# Patient Record
Sex: Female | Born: 1960
Health system: Southern US, Community
[De-identification: ages and names within clinical notes are randomized; demographics above are authoritative.]

## PROBLEM LIST (undated history)

## (undated) DIAGNOSIS — B019 Varicella without complication: Secondary | ICD-10-CM

## (undated) DIAGNOSIS — R600 Localized edema: Secondary | ICD-10-CM

## (undated) DIAGNOSIS — B009 Herpesviral infection, unspecified: Secondary | ICD-10-CM

## (undated) DIAGNOSIS — E039 Hypothyroidism, unspecified: Secondary | ICD-10-CM

## (undated) DIAGNOSIS — K649 Unspecified hemorrhoids: Secondary | ICD-10-CM

## (undated) DIAGNOSIS — Z9889 Other specified postprocedural states: Secondary | ICD-10-CM

## (undated) DIAGNOSIS — R112 Nausea with vomiting, unspecified: Secondary | ICD-10-CM

## (undated) DIAGNOSIS — M7541 Impingement syndrome of right shoulder: Secondary | ICD-10-CM

## (undated) HISTORY — DX: Unspecified hemorrhoids: K64.9

## (undated) HISTORY — DX: Hypothyroidism, unspecified: E03.9

## (undated) HISTORY — DX: Localized edema: R60.0

## (undated) HISTORY — DX: Varicella without complication: B01.9

## (undated) HISTORY — PX: HEMORRHOID SURGERY: SHX153

## (undated) HISTORY — DX: Herpesviral infection, unspecified: B00.9

---
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
## 2000-03-20 ENCOUNTER — Other Ambulatory Visit: Admission: RE | Admit: 2000-03-20 | Discharge: 2000-03-20 | Payer: Self-pay | Admitting: Gynecology

## 2000-04-21 ENCOUNTER — Other Ambulatory Visit: Admission: RE | Admit: 2000-04-21 | Discharge: 2000-04-21 | Payer: Self-pay | Admitting: Gynecology

## 2000-08-19 ENCOUNTER — Observation Stay (HOSPITAL_COMMUNITY): Admission: EM | Admit: 2000-08-19 | Discharge: 2000-08-20 | Payer: Self-pay | Admitting: Emergency Medicine

## 2000-08-19 ENCOUNTER — Encounter: Payer: Self-pay | Admitting: Emergency Medicine

## 2000-08-20 ENCOUNTER — Encounter: Payer: Self-pay | Admitting: Orthopedic Surgery

## 2002-11-11 HISTORY — PX: TUBAL LIGATION: SHX77

## 2012-11-11 HISTORY — PX: BREAST LUMPECTOMY: SHX2

## 2012-11-11 HISTORY — PX: BREAST EXCISIONAL BIOPSY: SUR124

## 2014-01-21 LAB — HM COLONOSCOPY

## 2015-12-08 DIAGNOSIS — E569 Vitamin deficiency, unspecified: Secondary | ICD-10-CM | POA: Diagnosis not present

## 2015-12-08 DIAGNOSIS — N926 Irregular menstruation, unspecified: Secondary | ICD-10-CM | POA: Diagnosis not present

## 2015-12-08 DIAGNOSIS — E039 Hypothyroidism, unspecified: Secondary | ICD-10-CM | POA: Diagnosis not present

## 2015-12-08 DIAGNOSIS — R600 Localized edema: Secondary | ICD-10-CM | POA: Diagnosis not present

## 2015-12-08 DIAGNOSIS — L906 Striae atrophicae: Secondary | ICD-10-CM | POA: Diagnosis not present

## 2015-12-08 DIAGNOSIS — Z0000 Encounter for general adult medical examination without abnormal findings: Secondary | ICD-10-CM | POA: Diagnosis not present

## 2015-12-19 MED FILL — valACYclovir HCL 1 GM TABS: 1 | 30 days supply | Qty: 30 | Fill #0

## 2016-02-20 DIAGNOSIS — K645 Perianal venous thrombosis: Secondary | ICD-10-CM | POA: Diagnosis not present

## 2016-02-20 MED FILL — HYDROCORTISONE 2.5% CREAM: 2.5 | 10 days supply | Qty: 30 | Fill #0

## 2016-04-02 MED FILL — HYDROCORTISONE 2.5% CREAM: 2.5 | 10 days supply | Qty: 30 | Fill #1

## 2016-05-31 DIAGNOSIS — J029 Acute pharyngitis, unspecified: Secondary | ICD-10-CM | POA: Diagnosis not present

## 2016-05-31 DIAGNOSIS — Z1159 Encounter for screening for other viral diseases: Secondary | ICD-10-CM | POA: Diagnosis not present

## 2016-05-31 DIAGNOSIS — N926 Irregular menstruation, unspecified: Secondary | ICD-10-CM | POA: Diagnosis not present

## 2016-05-31 DIAGNOSIS — Z20818 Contact with and (suspected) exposure to other bacterial communicable diseases: Secondary | ICD-10-CM | POA: Diagnosis not present

## 2016-05-31 DIAGNOSIS — R600 Localized edema: Secondary | ICD-10-CM | POA: Diagnosis not present

## 2016-05-31 DIAGNOSIS — E039 Hypothyroidism, unspecified: Secondary | ICD-10-CM | POA: Diagnosis not present

## 2016-05-31 DIAGNOSIS — L906 Striae atrophicae: Secondary | ICD-10-CM | POA: Diagnosis not present

## 2016-12-04 ENCOUNTER — Other Ambulatory Visit: Payer: Self-pay | Admitting: Pediatrics

## 2016-12-04 MED ORDER — AZITHROMYCIN 250 MG PO TABS: ORAL_TABLET | ORAL | 0 refills | Status: DC

## 2016-12-04 MED FILL — AZITHROMYCIN 250 MG TABLET: 250 | 5 days supply | Qty: 6 | Fill #0

## 2016-12-04 NOTE — Progress Notes (Signed)
Sinusitis

## 2017-01-14 ENCOUNTER — Ambulatory Visit (INDEPENDENT_AMBULATORY_CARE_PROVIDER_SITE_OTHER): Payer: 59 | Admitting: Family Medicine

## 2017-01-14 ENCOUNTER — Encounter: Payer: Self-pay | Admitting: Family Medicine

## 2017-01-14 VITALS — BP 105/74 | HR 67 | Temp 98.1°F | Resp 20 | Ht 64.5 in | Wt 164.2 lb

## 2017-01-14 DIAGNOSIS — Z0000 Encounter for general adult medical examination without abnormal findings: Secondary | ICD-10-CM | POA: Insufficient documentation

## 2017-01-14 DIAGNOSIS — E559 Vitamin D deficiency, unspecified: Secondary | ICD-10-CM | POA: Insufficient documentation

## 2017-01-14 DIAGNOSIS — Z1239 Encounter for other screening for malignant neoplasm of breast: Secondary | ICD-10-CM

## 2017-01-14 DIAGNOSIS — Z13220 Encounter for screening for lipoid disorders: Secondary | ICD-10-CM

## 2017-01-14 DIAGNOSIS — Z1231 Encounter for screening mammogram for malignant neoplasm of breast: Secondary | ICD-10-CM

## 2017-01-14 DIAGNOSIS — Z114 Encounter for screening for human immunodeficiency virus [HIV]: Secondary | ICD-10-CM | POA: Diagnosis not present

## 2017-01-14 DIAGNOSIS — E039 Hypothyroidism, unspecified: Secondary | ICD-10-CM | POA: Insufficient documentation

## 2017-01-14 DIAGNOSIS — Z130 Encounter for screening for diseases of the blood and blood-forming organs and certain disorders involving the immune mechanism: Secondary | ICD-10-CM

## 2017-01-14 DIAGNOSIS — Z131 Encounter for screening for diabetes mellitus: Secondary | ICD-10-CM

## 2017-01-14 DIAGNOSIS — Z23 Encounter for immunization: Secondary | ICD-10-CM | POA: Diagnosis not present

## 2017-01-14 LAB — CBC WITH DIFFERENTIAL/PLATELET
Basophils Absolute: 0.1 10*3/uL (ref 0.0–0.1)
Basophils Relative: 1.1 % (ref 0.0–3.0)
Eosinophils Absolute: 0.2 10*3/uL (ref 0.0–0.7)
Eosinophils Relative: 2.9 % (ref 0.0–5.0)
HCT: 38.8 % (ref 36.0–46.0)
Hemoglobin: 12.5 g/dL (ref 12.0–15.0)
Lymphocytes Relative: 25.5 % (ref 12.0–46.0)
Lymphs Abs: 1.5 10*3/uL (ref 0.7–4.0)
MCHC: 32.2 g/dL (ref 30.0–36.0)
MCV: 88.4 fl (ref 78.0–100.0)
Monocytes Absolute: 0.5 10*3/uL (ref 0.1–1.0)
Monocytes Relative: 8.1 % (ref 3.0–12.0)
Neutro Abs: 3.7 10*3/uL (ref 1.4–7.7)
Neutrophils Relative %: 62.4 % (ref 43.0–77.0)
Platelets: 352 10*3/uL (ref 150.0–400.0)
RBC: 4.39 Mil/uL (ref 3.87–5.11)
RDW: 15.3 % (ref 11.5–15.5)
WBC: 5.9 10*3/uL (ref 4.0–10.5)

## 2017-01-14 LAB — COMPREHENSIVE METABOLIC PANEL
ALT: 9 U/L (ref 0–35)
AST: 16 U/L (ref 0–37)
Albumin: 4 g/dL (ref 3.5–5.2)
Alkaline Phosphatase: 58 U/L (ref 39–117)
BUN: 13 mg/dL (ref 6–23)
CO2: 32 mEq/L (ref 19–32)
Calcium: 9.3 mg/dL (ref 8.4–10.5)
Chloride: 104 mEq/L (ref 96–112)
Creatinine, Ser: 0.64 mg/dL (ref 0.40–1.20)
GFR: 102.79 mL/min (ref >60.00)
Glucose, Bld: 89 mg/dL (ref 70–99)
Potassium: 4.8 mEq/L (ref 3.5–5.1)
Sodium: 140 mEq/L (ref 135–145)
Total Bilirubin: 0.3 mg/dL (ref 0.2–1.2)
Total Protein: 6.9 g/dL (ref 6.0–8.3)

## 2017-01-14 LAB — LIPID PANEL
Cholesterol: 230 mg/dL — ABNORMAL HIGH (ref 0–200)
HDL: 74.4 mg/dL (ref >39.00)
LDL Cholesterol: 134 mg/dL — ABNORMAL HIGH (ref 0–99)
NonHDL: 155.19
Total CHOL/HDL Ratio: 3
Triglycerides: 107 mg/dL (ref 0.0–149.0)
VLDL: 21.4 mg/dL (ref 0.0–40.0)

## 2017-01-14 LAB — TSH: TSH: 0.7 u[IU]/mL (ref 0.35–4.50)

## 2017-01-14 LAB — VITAMIN D 25 HYDROXY (VIT D DEFICIENCY, FRACTURES): VITD: 11.54 ng/mL — AB (ref 30.00–100.00)

## 2017-01-14 NOTE — Progress Notes (Signed)
Patient ID: Molly Park, female  DOB: [birth year], 56 y.o.   MRN: [id number] Patient Care Team    Relationship Specialty Notifications Start End  Ma Hillock, DO PCP - General Family Medicine  01/14/17     Subjective:  Molly Park is a 56 y.o.  female present for new patient establishment. All past medical history, surgical history, allergies, family history, immunizations, medications and social history were obtained in the electronic medical record today. All recent labs, ED visits and hospitalizations within the last year were reviewed.  Health maintenance:  Colonoscopy: no fhx, screen completed 01/21/2014,  at ? Provider in [place].  Mammogram: completed:2015, birads 2. Order placed today for mammogram at [facility]. Cervical cancer screening: last pap: 2016, results: ?, completed by: [place] Immunizations: tdap ~10 years updated today. Influenza UTD 2017(encouraged yearly) Infectious disease screening: HIV completed today, Hep C completed 05/2016 DEXA: n/a Assistive device:None Oxygen VXB:LTJQ Patient has a Dental home. Hospitalizations/ED visits: None   Depression screen [facility] 2/9 01/14/2017  Decreased Interest 0  Down, Depressed, Hopeless 0  PHQ - 2 Score 0   Current Exercise Habits: Structured exercise class, Type of exercise: strength training/weights, Time (Minutes): 60, Frequency (Times/Week): 3, Weekly Exercise (Minutes/Week): 180, Intensity: Moderate Exercise limited by: None identified   Immunization History  Administered Date(s) Administered  . Hepatitis A, Ped/Adol-2 Dose 12/07/2012  . Influenza, Seasonal, Injecte, Preservative Fre 09/15/2014  . Influenza-Unspecified 08/11/2016  . Tdap 01/14/2017     Past Medical History:  Diagnosis Date  . Bilateral edema of lower extremity    tried on low dose lasix by prior provider  . Chicken pox   . Hemorrhoid   . HSV infection   . Hypothyroidism    No Known Allergies Past Surgical History:    Procedure Laterality Date  . APPENDECTOMY  1973  . BREAST LUMPECTOMY Right 2014  . HEMORRHOID SURGERY    . TUBAL LIGATION  2004   Family History  Problem Relation Age of Onset  . Diabetes Mother   . Valvular heart disease Father   . Breast cancer Maternal Aunt     50   Social History   Social History  . Marital status: Married    Spouse name: Barbaraann Rondo  . Number of children: 5  . Years of education: BA   Occupational History  . Practice admin    Social History Main Topics  . Smoking status: Never Smoker  . Smokeless tobacco: Never Used  . Alcohol use No  . Drug use: No  . Sexual activity: Yes    Partners: Male    Birth control/ protection: Surgical     Comment: Married   Other Topics Concern  . Not on file   Social History Narrative   Patient is married to Leaf. They have 5 children btw them.   BA degree. 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   Drinks caffeine.   Wears a seatbelt, wears a bicycle helmet, smoke detector in the home.   Firearms in the home.   Exercises routinely.   Feels safe in her relationships.   Allergies as of 01/14/2017   No Known Allergies     Medication List       Accurate as of 01/14/17  6:39 PM. Always use your most recent med list.          Boric Acid Powd Apply one suppository vaginally at bedtime before menses or after   SYNTHROID 100 MCG  tablet Generic drug:  levothyroxine Take 100 mcg by mouth. Brand Name only   valACYclovir 1000 MG tablet Commonly known as:  VALTREX Take by mouth.      Recent Results (from the past 2160 hour(s))  CBC w/Diff     Status: None   Collection Time: 01/14/17  9:58 AM  Result Value Ref Range   WBC 5.9 4.0 - 10.5 K/uL   RBC 4.39 3.87 - 5.11 Mil/uL   Hemoglobin 12.5 12.0 - 15.0 g/dL   HCT 38.8 36.0 - 46.0 %   MCV 88.4 78.0 - 100.0 fl   MCHC 32.2 30.0 - 36.0 g/dL   RDW 15.3 11.5 - 15.5 %   Platelets 352.0 150.0 - 400.0 K/uL   Neutrophils Relative % 62.4  43.0 - 77.0 %   Lymphocytes Relative 25.5 12.0 - 46.0 %   Monocytes Relative 8.1 3.0 - 12.0 %   Eosinophils Relative 2.9 0.0 - 5.0 %   Basophils Relative 1.1 0.0 - 3.0 %   Neutro Abs 3.7 1.4 - 7.7 K/uL   Lymphs Abs 1.5 0.7 - 4.0 K/uL   Monocytes Absolute 0.5 0.1 - 1.0 K/uL   Eosinophils Absolute 0.2 0.0 - 0.7 K/uL   Basophils Absolute 0.1 0.0 - 0.1 K/uL  Comp Met (CMET)     Status: None   Collection Time: 01/14/17  9:58 AM  Result Value Ref Range   Sodium 140 135 - 145 mEq/L   Potassium 4.8 3.5 - 5.1 mEq/L   Chloride 104 96 - 112 mEq/L   CO2 32 19 - 32 mEq/L   Glucose, Bld 89 70 - 99 mg/dL   BUN 13 6 - 23 mg/dL   Creatinine, Ser 0.64 0.40 - 1.20 mg/dL   Total Bilirubin 0.3 0.2 - 1.2 mg/dL   Alkaline Phosphatase 58 39 - 117 U/L   AST 16 0 - 37 U/L   ALT 9 0 - 35 U/L   Total Protein 6.9 6.0 - 8.3 g/dL   Albumin 4.0 3.5 - 5.2 g/dL   Calcium 9.3 8.4 - 10.5 mg/dL   GFR 102.79 >60.00 mL/min  Lipid panel     Status: Abnormal   Collection Time: 01/14/17  9:58 AM  Result Value Ref Range   Cholesterol 230 (H) 0 - 200 mg/dL    Comment: ATP III Classification       Desirable:  < 200 mg/dL               Borderline High:  200 - 239 mg/dL          High:  > = 240 mg/dL   Triglycerides 107.0 0.0 - 149.0 mg/dL    Comment: Normal:  <150 mg/dLBorderline High:  150 - 199 mg/dL   HDL 74.40 >39.00 mg/dL   VLDL 21.4 0.0 - 40.0 mg/dL   LDL Cholesterol 134 (H) 0 - 99 mg/dL   Total CHOL/HDL Ratio 3     Comment:                Men          Women1/2 Average Risk     3.4          3.3Average Risk          5.0          4.42X Average Risk          9.6          7.13X Average Risk  15.0          11.0                       NonHDL 155.19     Comment: NOTE:  Non-HDL goal should be 30 mg/dL higher than patient's LDL goal (i.e. LDL goal of < 70 mg/dL, would have non-HDL goal of < 100 mg/dL)  TSH     Status: None   Collection Time: 01/14/17  9:58 AM  Result Value Ref Range   TSH 0.70 0.35 - 4.50 uIU/mL    Vitamin D (25 hydroxy)     Status: Abnormal   Collection Time: 01/14/17  9:58 AM  Result Value Ref Range   VITD 11.54 (L) 30.00 - 100.00 ng/mL    ROS: 14 pt review of systems performed and negative (unless mentioned in an HPI)  Objective: BP 105/74 (BP Location: Left Arm, Patient Position: Sitting, Cuff Size: Normal)   Pulse 67   Temp 98.1 F (36.7 C)   Resp 20   Ht 5' 4.5" (1.638 m)   Wt 164 lb 4 oz (74.5 kg)   SpO2 98%   BMI 27.76 kg/m  Gen: Afebrile. No acute distress. Nontoxic in appearance, well-developed, well-nourished,  Pleasant, female. HENT: AT. [place]. Bilateral TM visualized and normal in appearance, normal external auditory canal. MMM, no oral lesions, adequate dentition. Bilateral nares within normal limits. Throat without erythema, ulcerations or exudates. No Cough on exam, no hoarseness on exam. Eyes:Pupils Equal Round Reactive to light, Extraocular movements intact,  Conjunctiva without redness, discharge or icterus. Neck/lymp/endocrine: Supple, no lymphadenopathy, no thyromegaly CV: RRR no murmur, no edema, +2/4 P posterior tibialis pulses. No carotid bruits. No JVD. Chest: CTAB, no wheeze, rhonchi or crackles. Normal Respiratory effort. Good Air movement. Abd: Soft. Flat. NTND. BS present. No Masses palpated. No hepatosplenomegaly. No rebound tenderness or guarding. Skin: No rashes, purpura or petechiae. Warm and well-perfused. Skin intact. Neuro/Msk:  Normal gait. PERLA. EOMi. Alert. Oriented x3.  Cranial nerves II through XII intact. Muscle strength 5/5 upper/lower extremity. DTRs equal bilaterally. Psych: Normal affect, dress and demeanor. Normal speech. Normal thought content and judgment.   Assessment/plan: Molly Park is a 56 y.o. female present for establishment of care with CPE.  Encounter for preventive health examination Patient was encouraged to exercise greater than 150 minutes a week. Patient was encouraged to choose a diet filled with fresh fruits  and vegetables, and lean meats. AVS provided to patient today for education/recommendation on gender specific health and safety maintenance. Colonoscopy: no fhx, screen completed 01/21/2014,  at ? Provider in [place]. Requested records.  Mammogram: completed:2015, birads 2. Order placed today for mammogram at [facility]. Cervical cancer screening: last pap: 2016, results: ?, completed by: [place] Immunizations: tdap updated today. Influenza UTD 2017(encouraged yearly) Infectious disease screening: HIV completed today, Hep C completed 05/2016 Hypothyroidism, unspecified type - TSH - pts last tsh was abnormal 6 months ago, pt states she fluctuates. No med dose changed at that time.  Screening cholesterol level - Lipid panel - TSH Diabetes mellitus screening - Hemoglobin A1c Encounter for screening for HIV - HIV antibody Vitamin D deficiency - Vitamin D (25 hydroxy) Screening for deficiency anemia - CBC w/Diff Breast cancer screening - MM Digital Screening; Future Need for Tdap vaccination - Tdap vaccine greater than or equal to 7yo IM   Return in about 1 year (around 01/14/2018) for CPE.  Electronically signed by: Howard Pouch, DO [place]

## 2017-01-14 NOTE — Patient Instructions (Signed)
Start 1000 units of vitamin D a day with a meal.  I will call you with labs once available.   Follow yearly with physicals. It was a pleasure to meet you.    Health Maintenance, Female Adopting a healthy lifestyle and getting preventive care can go a long way to promote health and wellness. Talk with your health care provider about what schedule of regular examinations is right for you. This is a good chance for you to check in with your provider about disease prevention and staying healthy. In between checkups, there are plenty of things you can do on your own. Experts have done a lot of research about which lifestyle changes and preventive measures are most likely to keep you healthy. Ask your health care provider for more information. Weight and diet Eat a healthy diet  Be sure to include plenty of vegetables, fruits, low-fat dairy products, and lean protein.  Do not eat a lot of foods high in solid fats, added sugars, or salt.  Get regular exercise. This is one of the most important things you can do for your health.  Most adults should exercise for at least 150 minutes each week. The exercise should increase your heart rate and make you sweat (moderate-intensity exercise).  Most adults should also do strengthening exercises at least twice a week. This is in addition to the moderate-intensity exercise. Maintain a healthy weight  Body mass index (BMI) is a measurement that can be used to identify possible weight problems. It estimates body fat based on height and weight. Your health care provider can help determine your BMI and help you achieve or maintain a healthy weight.  For females 51 years of age and older:  A BMI below 18.5 is considered underweight.  A BMI of 18.5 to 24.9 is normal.  A BMI of 25 to 29.9 is considered overweight.  A BMI of 30 and above is considered obese. Watch levels of cholesterol and blood lipids  You should start having your blood tested for lipids  and cholesterol at 54 years of age, then have this test every 5 years.  You may need to have your cholesterol levels checked more often if:  Your lipid or cholesterol levels are high.  You are older than 54 years of age.  You are at high risk for heart disease. Cancer screening Lung Cancer  Lung cancer screening is recommended for adults 107-43 years old who are at high risk for lung cancer because of a history of smoking.  A yearly low-dose CT scan of the lungs is recommended for people who:  Currently smoke.  Have quit within the past 15 years.  Have at least a 30-pack-year history of smoking. A pack year is smoking an average of one pack of cigarettes a day for 1 year.  Yearly screening should continue until it has been 15 years since you quit.  Yearly screening should stop if you develop a health problem that would prevent you from having lung cancer treatment. Breast Cancer  Practice breast self-awareness. This means understanding how your breasts normally appear and feel.  It also means doing regular breast self-exams. Let your health care provider know about any changes, no matter how small.  If you are in your 20s or 30s, you should have a clinical breast exam (CBE) by a health care provider every 1-3 years as part of a regular health exam.  If you are 34 or older, have a CBE every year. Also consider having  a breast X-ray (mammogram) every year.  If you have a family history of breast cancer, talk to your health care provider about genetic screening.  If you are at high risk for breast cancer, talk to your health care provider about having an MRI and a mammogram every year.  Breast cancer gene (BRCA) assessment is recommended for women who have family members with BRCA-related cancers. BRCA-related cancers include:  Breast.  Ovarian.  Tubal.  Peritoneal cancers.  Results of the assessment will determine the need for genetic counseling and BRCA1 and BRCA2  testing. Cervical Cancer  Your health care provider may recommend that you be screened regularly for cancer of the pelvic organs (ovaries, uterus, and vagina). This screening involves a pelvic examination, including checking for microscopic changes to the surface of your cervix (Pap test). You may be encouraged to have this screening done every 3 years, beginning at age 51.  For women ages 17-65, health care providers may recommend pelvic exams and Pap testing every 3 years, or they may recommend the Pap and pelvic exam, combined with testing for human papilloma virus (HPV), every 5 years. Some types of HPV increase your risk of cervical cancer. Testing for HPV may also be done on women of any age with unclear Pap test results.  Other health care providers may not recommend any screening for nonpregnant women who are considered low risk for pelvic cancer and who do not have symptoms. Ask your health care provider if a screening pelvic exam is right for you.  If you have had past treatment for cervical cancer or a condition that could lead to cancer, you need Pap tests and screening for cancer for at least 20 years after your treatment. If Pap tests have been discontinued, your risk factors (such as having a new sexual partner) need to be reassessed to determine if screening should resume. Some women have medical problems that increase the chance of getting cervical cancer. In these cases, your health care provider may recommend more frequent screening and Pap tests. Colorectal Cancer  This type of cancer can be detected and often prevented.  Routine colorectal cancer screening usually begins at 54 years of age and continues through 54 years of age.  Your health care provider may recommend screening at an earlier age if you have risk factors for colon cancer.  Your health care provider may also recommend using home test kits to check for hidden blood in the stool.  A small camera at the end of a  tube can be used to examine your colon directly (sigmoidoscopy or colonoscopy). This is done to check for the earliest forms of colorectal cancer.  Routine screening usually begins at age 70.  Direct examination of the colon should be repeated every 5-10 years through 54 years of age. However, you may need to be screened more often if early forms of precancerous polyps or small growths are found. Skin Cancer  Check your skin from head to toe regularly.  Tell your health care provider about any new moles or changes in moles, especially if there is a change in a mole's shape or color.  Also tell your health care provider if you have a mole that is larger than the size of a pencil eraser.  Always use sunscreen. Apply sunscreen liberally and repeatedly throughout the day.  Protect yourself by wearing long sleeves, pants, a wide-brimmed hat, and sunglasses whenever you are outside. Heart disease, diabetes, and high blood pressure  High blood pressure  causes heart disease and increases the risk of stroke. High blood pressure is more likely to develop in:  People who have blood pressure in the high end of the normal range (130-139/85-89 mm Hg).  People who are overweight or obese.  People who are African American.  If you are 62-32 years of age, have your blood pressure checked every 3-5 years. If you are 25 years of age or older, have your blood pressure checked every year. You should have your blood pressure measured twice-once when you are at a hospital or clinic, and once when you are not at a hospital or clinic. Record the average of the two measurements. To check your blood pressure when you are not at a hospital or clinic, you can use:  An automated blood pressure machine at a pharmacy.  A home blood pressure monitor.  If you are between 71 years and 50 years old, ask your health care provider if you should take aspirin to prevent strokes.  Have regular diabetes screenings. This  involves taking a blood sample to check your fasting blood sugar level.  If you are at a normal weight and have a low risk for diabetes, have this test once every three years after 54 years of age.  If you are overweight and have a high risk for diabetes, consider being tested at a younger age or more often. Preventing infection Hepatitis B  If you have a higher risk for hepatitis B, you should be screened for this virus. You are considered at high risk for hepatitis B if:  You were born in a country where hepatitis B is common. Ask your health care provider which countries are considered high risk.  Your parents were born in a high-risk country, and you have not been immunized against hepatitis B (hepatitis B vaccine).  You have HIV or AIDS.  You use needles to inject street drugs.  You live with someone who has hepatitis B.  You have had sex with someone who has hepatitis B.  You get hemodialysis treatment.  You take certain medicines for conditions, including cancer, organ transplantation, and autoimmune conditions. Hepatitis C  Blood testing is recommended for:  Everyone born from [birth year] through 1963.  Anyone with known risk factors for hepatitis C. Sexually transmitted infections (STIs)  You should be screened for sexually transmitted infections (STIs) including gonorrhea and chlamydia if:  You are sexually active and are younger than 54 years of age.  You are older than 54 years of age and your health care provider tells you that you are at risk for this type of infection.  Your sexual activity has changed since you were last screened and you are at an increased risk for chlamydia or gonorrhea. Ask your health care provider if you are at risk.  If you do not have HIV, but are at risk, it may be recommended that you take a prescription medicine daily to prevent HIV infection. This is called pre-exposure prophylaxis (PrEP). You are considered at risk if:  You are  sexually active and do not regularly use condoms or know the HIV status of your partner(s).  You take drugs by injection.  You are sexually active with a partner who has HIV. Talk with your health care provider about whether you are at high risk of being infected with HIV. If you choose to begin PrEP, you should first be tested for HIV. You should then be tested every 3 months for as long as you are  taking PrEP. Pregnancy  If you are premenopausal and you may become pregnant, ask your health care provider about preconception counseling.  If you may become pregnant, take 400 to 800 micrograms (mcg) of folic acid every day.  If you want to prevent pregnancy, talk to your health care provider about birth control (contraception). Osteoporosis and menopause  Osteoporosis is a disease in which the bones lose minerals and strength with aging. This can result in serious bone fractures. Your risk for osteoporosis can be identified using a bone density scan.  If you are 38 years of age or older, or if you are at risk for osteoporosis and fractures, ask your health care provider if you should be screened.  Ask your health care provider whether you should take a calcium or vitamin D supplement to lower your risk for osteoporosis.  Menopause may have certain physical symptoms and risks.  Hormone replacement therapy may reduce some of these symptoms and risks. Talk to your health care provider about whether hormone replacement therapy is right for you. Follow these instructions at home:  Schedule regular health, dental, and eye exams.  Stay current with your immunizations.  Do not use any tobacco products including cigarettes, chewing tobacco, or electronic cigarettes.  If you are pregnant, do not drink alcohol.  If you are breastfeeding, limit how much and how often you drink alcohol.  Limit alcohol intake to no more than 1 drink per day for nonpregnant women. One drink equals 12 ounces of  beer, 5 ounces of wine, or 1 ounces of hard liquor.  Do not use street drugs.  Do not share needles.  Ask your health care provider for help if you need support or information about quitting drugs.  Tell your health care provider if you often feel depressed.  Tell your health care provider if you have ever been abused or do not feel safe at home. This information is not intended to replace advice given to you by your health care provider. Make sure you discuss any questions you have with your health care provider. Document Released: 05/13/2011 Document Revised: 04/04/2016 Document Reviewed: 08/01/2015 Elsevier Interactive Patient Education  2017 [employer].

## 2017-01-15 ENCOUNTER — Telehealth: Payer: Self-pay | Admitting: Family Medicine

## 2017-01-15 LAB — HIV ANTIBODY (ROUTINE TESTING W REFLEX): HIV: NONREACTIVE

## 2017-01-15 LAB — HEMOGLOBIN A1C
Hgb A1c MFr Bld: 5.3 % (ref <5.7)
MEAN PLASMA GLUCOSE: 105 mg/dL

## 2017-01-15 MED ORDER — VITAMIN D (ERGOCALCIFEROL) 1.25 MG (50000 UNIT) PO CAPS: 50000.0000 [IU] | ORAL_CAPSULE | ORAL | 0 refills | Status: DC

## 2017-01-15 MED ORDER — LEVOTHYROXINE SODIUM 100 MCG PO TABS: 100.0000 ug | ORAL_TABLET | Freq: Every day | ORAL | 3 refills | Status: DC

## 2017-01-15 MED FILL — VIT D2 1.25 MG (50,000 UNIT: 1.25 MG | 84 days supply | Qty: 12 | Fill #0

## 2017-01-15 NOTE — Telephone Encounter (Signed)
Please call pt: - all labs l;ook great except she has very low vitd. I recommend she take 1000u OTC daily with a meal, and I have prescribed her a once weekly supplement for 12 weeks. Would retest in 12 weeks with lab appt. - her thyroid was normal, refilled her med.

## 2017-01-15 NOTE — Telephone Encounter (Signed)
Patient notified and verbalized understanding. 

## 2017-02-14 ENCOUNTER — Other Ambulatory Visit (INDEPENDENT_AMBULATORY_CARE_PROVIDER_SITE_OTHER): Payer: Self-pay | Admitting: Pediatrics

## 2017-02-14 DIAGNOSIS — M272 Inflammatory conditions of jaws: Secondary | ICD-10-CM

## 2017-02-14 MED ORDER — AMOXICILLIN-POT CLAVULANATE 875-125 MG PO TABS: 1.0000 | ORAL_TABLET | Freq: Two times a day (BID) | ORAL | 0 refills | Status: AC

## 2017-02-14 MED FILL — AMOX-CLAV 875-125 MG TABLET: 875-125 | 14 days supply | Qty: 28 | Fill #0

## 2017-02-14 NOTE — Progress Notes (Signed)
Phone call: Lower molar odontogenic infection with swelling of gums and unilateral facial pain. Dentist can see next Thursday. Recommended: Augmentin  PO BID in the meantime.

## 2017-03-05 ENCOUNTER — Ambulatory Visit
Admission: RE | Admit: 2017-03-05 | Discharge: 2017-03-05 | Disposition: A | Discharge status: Home / Self Care | Payer: 59 | Source: Ambulatory Visit | Attending: Family Medicine | Admitting: Family Medicine

## 2017-03-05 DIAGNOSIS — Z1239 Encounter for other screening for malignant neoplasm of breast: Secondary | ICD-10-CM

## 2017-03-05 DIAGNOSIS — Z1231 Encounter for screening mammogram for malignant neoplasm of breast: Secondary | ICD-10-CM | POA: Diagnosis not present

## 2017-04-15 ENCOUNTER — Telehealth: Payer: Self-pay | Admitting: Family Medicine

## 2017-04-15 NOTE — Telephone Encounter (Signed)
Patient called stating that her RX has to be Synthroid, not Levothyroxine because levothyroxine causes pt's hair to fall out.   Please contact pharmacy.

## 2017-04-16 ENCOUNTER — Other Ambulatory Visit: Payer: Self-pay | Admitting: *Deleted

## 2017-04-16 MED FILL — SYNTHROID 100 MCG TABLET: 100 | 90 days supply | Qty: 90 | Fill #0

## 2017-04-16 NOTE — Telephone Encounter (Signed)
Called pharmacy they will Rx brand name Synthroid for patient.

## 2017-04-16 NOTE — Telephone Encounter (Signed)
Called pharmacy clarified  order

## 2017-07-28 MED FILL — SYNTHROID 100 MCG TABLET: 100 | 90 days supply | Qty: 90 | Fill #1

## 2017-11-12 MED FILL — SYNTHROID 100 MCG TABLET: 100 | 90 days supply | Qty: 90 | Fill #2

## 2017-12-09 ENCOUNTER — Ambulatory Visit (INDEPENDENT_AMBULATORY_CARE_PROVIDER_SITE_OTHER): Payer: No Typology Code available for payment source | Admitting: Family Medicine

## 2017-12-09 ENCOUNTER — Encounter: Payer: Self-pay | Admitting: Family Medicine

## 2017-12-09 ENCOUNTER — Ambulatory Visit: Payer: Self-pay

## 2017-12-09 VITALS — BP 120/81 | HR 69 | Temp 98.0°F | Resp 20 | Wt 174.5 lb

## 2017-12-09 DIAGNOSIS — R229 Localized swelling, mass and lump, unspecified: Principal | ICD-10-CM

## 2017-12-09 DIAGNOSIS — IMO0002 Reserved for concepts with insufficient information to code with codable children: Secondary | ICD-10-CM | POA: Insufficient documentation

## 2017-12-09 NOTE — Patient Instructions (Signed)
[facility]. They will call to schedule.   Try aleve for discomfort.   Ultrasound results will guide us on further plan.   Lipoma A lipoma is a noncancerous (benign) tumor that is made up of fat cells. This is a very common type of soft-tissue growth. Lipomas are usually found under the skin (subcutaneous). They may occur in any tissue of the body that contains fat. Common areas for lipomas to appear include the back, shoulders, buttocks, and thighs. Lipomas grow slowly, and they are usually painless. Most lipomas do not cause problems and do not require treatment. What are the causes? The cause of this condition is not known. What increases the risk? This condition is more likely to develop in:  People who are 6940-55 years old.  People who have a family history of lipomas.  What are the signs or symptoms? A lipoma usually appears as a small, round bump under the skin. It may feel soft or rubbery, but the firmness can vary. Most lipomas are not painful. However, a lipoma may become painful if it is located in an area where it pushes on nerves. How is this diagnosed? A lipoma can usually be diagnosed with a physical exam. You may also have tests to confirm the diagnosis and to rule out other conditions. Tests may include:  Imaging tests, such as a CT scan or MRI.  Removal of a tissue sample to be looked at under a microscope (biopsy).  How is this treated? Treatment is not needed for small lipomas that are not causing problems. If a lipoma continues to get bigger or it causes problems, removal is often the best option. Lipomas can also be removed to improve appearance. Removal of a lipoma is usually done with a surgery in which the fatty cells and the surrounding capsule are removed. Most often, a medicine that numbs the area (local anesthetic) is used for this procedure. Follow these instructions at home:  Keep all follow-up visits as  directed by your health care provider. This is important. Contact a health care provider if:  Your lipoma becomes larger or hard.  Your lipoma becomes painful, red, or increasingly swollen. These could be signs of infection or a more serious condition. This information is not intended to replace advice given to you by your health care provider. Make sure you discuss any questions you have with your health care provider. Document Released: 10/18/2002 Document Revised: 04/04/2016 Document Reviewed: 10/24/2014 Elsevier Interactive Patient Education  [employer]2018 Elsevier Inc.

## 2017-12-09 NOTE — Progress Notes (Signed)
Molly Park , 04/29/1961, 57 y.o., female MRN: [id number] Patient Care Team    Relationship Specialty Notifications Start End  Molly Park, Molly [place] Park, Molly Park PCP - General Family Medicine  01/14/17     Chief Complaint  Patient presents with  . Back Pain    right lower back area she says its Park cyst     Subjective: Pt presents for an OV with complaints of right lower back discomfort that radiates to her right leg occasionally for the last 6 months. Patient reports she has Park cyst in the location of her discomfort. The cyst has become bigger and caused more discomfort over the last 6 months. She states standing for long periods of time and exercise increases her discomfort in her right lower back in the location of the cyst. She also endorses tingling sensation, mostly after exercising, down her right leg. Pt has tried NSAIDs to ease their symptoms.   [facility] 12/09/2017 01/14/2017  Decreased Interest 0 0  Down, Depressed, Hopeless 0 0  PHQ - 2 Score 0 0    No Known Allergies Social History   Tobacco Use  . Smoking status: Never Smoker  . Smokeless tobacco: Never Used  Substance Use Topics  . Alcohol use: No   Past Medical History:  Diagnosis Date  . Bilateral edema of lower extremity    tried on low dose lasix by prior provider  . Chicken pox   . Hemorrhoid   . HSV infection   . Hypothyroidism    Past Surgical History:  Procedure Laterality Date  . APPENDECTOMY  1973  . BREAST LUMPECTOMY Right 2014  . HEMORRHOID SURGERY    . TUBAL LIGATION  2004   Family History  Problem Relation Age of Onset  . Diabetes Mother   . Valvular heart disease Father   . Breast cancer Maternal Aunt        50   Allergies as of 12/09/2017   No Known Allergies     Medication List        Accurate as of 12/09/17  8:37 AM. Always use your most recent med list.          Boric Acid Powd Apply one suppository vaginally at bedtime before menses or after   SYNTHROID 100 MCG  tablet Generic drug:  levothyroxine Take 100 mcg by mouth. Brand Name only       All past medical history, surgical history, allergies, family history, immunizations andmedications were updated in the EMR today and reviewed under the history and medication portions of their EMR.     ROS: Negative, with the exception of above mentioned in HPI   Objective:  BP 120/81 (BP Location: Left Arm, Patient Position: Sitting, Cuff Size: Normal)   Pulse 69   Temp 98 F (36.7 C)   Resp 20   Wt 174 lb 8 oz (79.2 kg)   LMP 11/22/2017   SpO2 98%   BMI 29.49 kg/m  Body mass index is 29.49 kg/m. Gen: Afebrile. No acute distress. Nontoxic in appearance, well developed, well nourished.  MSK: no erythema, palpable ~ 1. 5-2 inch oval-shaped minimally mobile, tender soft tissue mass right lower lumbar, just above the right SI. Pt has Park tattoo near this area and mass location is between leaf and rose area on right side.  Skin: no erythema, bruising, rashes, purpura or petechiae. No comedone.  Neuro: Normal gait. PERLA. EOMi. Alert. Oriented x3   No exam data present No results  found. No results found for this or any previous visit (from the past 24 hour(s)).  Assessment/Plan: Tanae Petrosky is Park 57 y.o. female present for OV for  Soft tissue mass; - soft tissue mass right lower lumbar/superior SI joint. Patient reports masses growing in size over last 6 months with worsening discomfort. Will obtain muscle skeletal ultrasound of this area at [facility] imaging. Order will likely need to be changed to accommodate imaging centers need. - discuss potential differential diagnosis with patient which includes ganglion cyst, neuroma and lipoma. It is causing her symptoms with exercise and standing. - exam is not consistent with sebaceous cyst. - NSAIDs for discomfort. - follow-up dependent upon ultrasound results. Consider surgical referral, patient desires to have removed if possible.  Reviewed  expectations re: course of current medical issues.  Discussed self-management of symptoms.  Outlined signs and symptoms indicating need for more acute intervention.  Patient verbalized understanding and all questions were answered.  Patient received an After-Visit Summary.    No orders of the defined types were placed in this encounter.  Note is dictated utilizing voice recognition software. Although note has been proof read prior to signing, occasional typographical errors still can be missed. If any questions arise, please Molly Park not hesitate to call for verification.   electronically signed by:  Molly Pacini, Molly Park  [facility] - [place]

## 2017-12-12 ENCOUNTER — Telehealth: Payer: Self-pay | Admitting: *Deleted

## 2017-12-12 DIAGNOSIS — IMO0002 Reserved for concepts with insufficient information to code with codable children: Secondary | ICD-10-CM

## 2017-12-12 DIAGNOSIS — R229 Localized swelling, mass and lump, unspecified: Principal | ICD-10-CM

## 2017-12-12 NOTE — Telephone Encounter (Signed)
Called [place] [facility] spoke to Elease Hashimotoatricia Dr Claiborne BillingsKuneff wants Soft tissue US of an area on patient's lower right lumbar area just above buttock . Patient has a mass there . Elease Hashimotoatricia said order should be placed as a [id number] pelvis limited. Complete instructions were placed on the referral order. Placed order as directed.

## 2017-12-17 ENCOUNTER — Ambulatory Visit
Admission: RE | Admit: 2017-12-17 | Discharge: 2017-12-17 | Disposition: A | Discharge status: Home / Self Care | Payer: No Typology Code available for payment source | Source: Ambulatory Visit | Attending: Family Medicine | Admitting: Family Medicine

## 2017-12-17 DIAGNOSIS — IMO0002 Reserved for concepts with insufficient information to code with codable children: Secondary | ICD-10-CM

## 2017-12-17 DIAGNOSIS — R229 Localized swelling, mass and lump, unspecified: Principal | ICD-10-CM

## 2017-12-18 ENCOUNTER — Telehealth: Payer: Self-pay | Admitting: Family Medicine

## 2017-12-18 ENCOUNTER — Encounter: Payer: Self-pay | Admitting: *Deleted

## 2017-12-18 DIAGNOSIS — R229 Localized swelling, mass and lump, unspecified: Principal | ICD-10-CM

## 2017-12-18 DIAGNOSIS — M899 Disorder of bone, unspecified: Secondary | ICD-10-CM

## 2017-12-18 DIAGNOSIS — IMO0002 Reserved for concepts with insufficient information to code with codable children: Secondary | ICD-10-CM

## 2017-12-18 NOTE — Telephone Encounter (Signed)
Sent the information I discussed with patient on the phone in [facility] message as requested by patient.

## 2017-12-18 NOTE — Telephone Encounter (Signed)
Patient is requesting a MyChart message. She is asking what is the indication is for the MRI. She is still experiencing the pain so she has agreed to have the MRI 12/24/17.

## 2017-12-18 NOTE — Telephone Encounter (Signed)
Please call pt: - her US did not give cause to her discomfort. Although there is a small mass there, it may not be the actual cause of her discomfort or is a lipoma (fatty non-cancerous mass) and may be in a place irritating a nerve with movement.  - The next step would be to order advanced imaging, such as an MRI of the area. I have placed this order for her. Once she has an MRI date/time, please have her schedule an appt here to occur 2 days after MRI,  to discuss findings and further plan.    Documentation only: Small mass above right SI, pt feels is the cause of pain. However she has radicular pain down right leg. Need to rule out lumbar spine cause of radicular pain and better imaging of mass to  See if it is playing any role in her pain (neuroma etc). US of area was completed of "mass" and it was normal.

## 2017-12-18 NOTE — Telephone Encounter (Signed)
Spoke with patient reviewed US results ,instructions and information. Patient verbalized understanding. 

## 2017-12-19 ENCOUNTER — Ambulatory Visit (INDEPENDENT_AMBULATORY_CARE_PROVIDER_SITE_OTHER): Payer: No Typology Code available for payment source | Admitting: Family Medicine

## 2017-12-19 ENCOUNTER — Encounter: Payer: Self-pay | Admitting: Family Medicine

## 2017-12-19 VITALS — BP 108/70 | HR 70 | Temp 98.1°F | Resp 18 | Wt 173.2 lb

## 2017-12-19 DIAGNOSIS — B349 Viral infection, unspecified: Secondary | ICD-10-CM | POA: Diagnosis not present

## 2017-12-19 DIAGNOSIS — R6889 Other general symptoms and signs: Secondary | ICD-10-CM | POA: Diagnosis not present

## 2017-12-19 LAB — POC INFLUENZA A&B (BINAX/QUICKVUE)
Influenza A, POC: NEGATIVE
Influenza B, POC: NEGATIVE

## 2017-12-19 NOTE — Progress Notes (Signed)
Molly Park , 1961-04-17, 57 y.o., female MRN: [id number] Patient Care Team    Relationship Specialty Notifications Start End  Natalia Leatherwood, DO PCP - General Family Medicine  01/14/17     Chief Complaint  Patient presents with  . URI    ,drainage, chills sore throat,headache x 2 days     Subjective: Pt presents for an OV with complaints of headache of 2 days' duration.  Associated symptoms include chills, sore throat, nasal drainage and nausea. Patient reports many people at work or outside. Some do have the flu. Patient has had her flu shot. Pt has tried over-the-counter sinus medication to ease their symptoms.   Depression screen [facility] 2/9 12/19/2017 12/09/2017 01/14/2017  Decreased Interest 0 0 0  Down, Depressed, Hopeless 0 0 0  PHQ - 2 Score 0 0 0    No Known Allergies Social History   Tobacco Use  . Smoking status: Never Smoker  . Smokeless tobacco: Never Used  Substance Use Topics  . Alcohol use: No   Past Medical History:  Diagnosis Date  . Bilateral edema of lower extremity    tried on low dose lasix by prior provider  . Chicken pox   . Hemorrhoid   . HSV infection   . Hypothyroidism    Past Surgical History:  Procedure Laterality Date  . APPENDECTOMY  1973  . BREAST LUMPECTOMY Right 2014  . HEMORRHOID SURGERY    . TUBAL LIGATION  2004   Family History  Problem Relation Age of Onset  . Diabetes Mother   . Valvular heart disease Father   . Breast cancer Maternal Aunt        50   Allergies as of 12/19/2017   No Known Allergies     Medication List        Accurate as of 12/19/17  5:39 PM. Always use your most recent med list.          Boric Acid Powd Apply one suppository vaginally at bedtime before menses or after   SYNTHROID 100 MCG tablet Generic drug:  levothyroxine Take 100 mcg by mouth. Brand Name only       All past medical history, surgical history, allergies, family history, immunizations andmedications were updated in the EMR  today and reviewed under the history and medication portions of their EMR.     ROS: Negative, with the exception of above mentioned in HPI   Objective:  BP 108/70 (BP Location: Left Arm, Patient Position: Sitting, Cuff Size: Large)   Pulse 70   Temp 98.1 F (36.7 C)   Resp 18   Wt 173 lb 4 oz (78.6 kg)   LMP 11/22/2017   SpO2 99%   BMI 29.28 kg/m  Body mass index is 29.28 kg/m. Gen: Afebrile. No acute distress. Nontoxic in appearance, well developed, well nourished.  HENT: AT. [place]. Bilateral TM visualized with fullness, no erythema. MMM, no oral lesions. Bilateral nares with erythema and drainage. Throat without erythema or exudates. No cough. No hoarseness. Eyes:Pupils Equal Round Reactive to light, Extraocular movements intact,  Conjunctiva without redness, discharge or icterus. Neck/lymp/endocrine: Supple, no lymphadenopathy CV: RRR no murmur, no edema Chest: CTAB, no wheeze or crackles. Good air movement, normal resp effort.  Abd: Soft. . NTND. BS present.  Skin: no rashes, purpura or petechiae.  Neuro: Normal gait. PERLA. EOMi. Alert. Oriented x3   No exam data present No results found. Results for orders placed or performed in visit on 12/19/17 (from  the past 24 hour(s))  POC Influenza A&B (Binax test)     Status: Normal   Collection Time: 12/19/17 11:13 AM  Result Value Ref Range   Influenza A, POC Negative Negative   Influenza B, POC Negative Negative    Assessment/Plan: Amedeo KinsmanJeannette Brazie is a 57 y.o. female present for OV for  Flu-like symptoms - POC Influenza A&B (Binax test)--> negative Viral syndrome Rest, hydrate. OTC supportive therapy +/- flonase, mucinex (DM if cough), nettie pot or nasal saline.  F/U 2 weeks of not improved.     Reviewed expectations re: course of current medical issues.  Discussed self-management of symptoms.  Outlined signs and symptoms indicating need for more acute intervention.  Patient verbalized understanding and all  questions were answered.  Patient received an After-Visit Summary.    Orders Placed This Encounter  Procedures  . POC Influenza A&B (Binax test)     Note is dictated utilizing voice recognition software. Although note has been proof read prior to signing, occasional typographical errors still can be missed. If any questions arise, please do not hesitate to call for verification.   electronically signed by:  Felix Pacinienee Kuneff, DO  [facility] - [place]

## 2017-12-19 NOTE — Patient Instructions (Signed)
Rest, hydrate.  + flonase, mucinex (DM if cough), nettie pot or nasal saline.  Advil or tylenol.  If cough present it can last up to 6-8 weeks.  F/U 2 weeks of not improved.   Influenza negative. Molly Park RivalYAY!!

## 2017-12-24 ENCOUNTER — Other Ambulatory Visit: Payer: Self-pay

## 2017-12-24 ENCOUNTER — Ambulatory Visit (INDEPENDENT_AMBULATORY_CARE_PROVIDER_SITE_OTHER): Payer: No Typology Code available for payment source | Admitting: Obstetrics and Gynecology

## 2017-12-24 ENCOUNTER — Ambulatory Visit
Admission: RE | Admit: 2017-12-24 | Discharge: 2017-12-24 | Disposition: A | Discharge status: Home / Self Care | Payer: No Typology Code available for payment source | Source: Ambulatory Visit | Attending: Family Medicine | Admitting: Family Medicine

## 2017-12-24 ENCOUNTER — Encounter: Payer: Self-pay | Admitting: Obstetrics and Gynecology

## 2017-12-24 VITALS — BP 124/80 | HR 80 | Resp 16 | Ht 64.0 in | Wt 172.0 lb

## 2017-12-24 DIAGNOSIS — R102 Pelvic and perineal pain: Secondary | ICD-10-CM | POA: Diagnosis not present

## 2017-12-24 DIAGNOSIS — R3989 Other symptoms and signs involving the genitourinary system: Secondary | ICD-10-CM

## 2017-12-24 DIAGNOSIS — N951 Menopausal and female climacteric states: Secondary | ICD-10-CM | POA: Diagnosis not present

## 2017-12-24 DIAGNOSIS — Z01419 Encounter for gynecological examination (general) (routine) without abnormal findings: Secondary | ICD-10-CM

## 2017-12-24 DIAGNOSIS — N926 Irregular menstruation, unspecified: Secondary | ICD-10-CM | POA: Diagnosis not present

## 2017-12-24 DIAGNOSIS — Z124 Encounter for screening for malignant neoplasm of cervix: Secondary | ICD-10-CM | POA: Diagnosis not present

## 2017-12-24 DIAGNOSIS — R109 Unspecified abdominal pain: Secondary | ICD-10-CM

## 2017-12-24 DIAGNOSIS — N393 Stress incontinence (female) (male): Secondary | ICD-10-CM | POA: Diagnosis not present

## 2017-12-24 DIAGNOSIS — M899 Disorder of bone, unspecified: Secondary | ICD-10-CM

## 2017-12-24 LAB — POCT URINALYSIS DIPSTICK
Bilirubin, UA: NEGATIVE
Glucose, UA: NEGATIVE
Ketones, UA: NEGATIVE
LEUKOCYTES UA: NEGATIVE
Nitrite, UA: NEGATIVE
PH UA: 5 (ref 5.0–8.0)
PROTEIN UA: NEGATIVE
RBC UA: NEGATIVE
UROBILINOGEN UA: NEGATIVE U/dL — AB

## 2017-12-24 MED ORDER — VALACYCLOVIR HCL 500 MG PO TABS: ORAL_TABLET | ORAL | 2 refills | Status: DC

## 2017-12-24 MED ORDER — GADOBENATE DIMEGLUMINE 529 MG/ML IV SOLN
15.0000 mL | Freq: Once | INTRAVENOUS | Status: AC | PRN
  Administered 2017-12-24: 15 mL via INTRAVENOUS

## 2017-12-24 NOTE — Progress Notes (Signed)
57 y.o. [id number] Legally Separated CaucasianF here for annual exam.   She is having night sweats multiple x a night for the last 3 weeks, prior to that intermittent. Not so bothered by hot flashes during the day. No vaginal dryness. Actually having more d/c.  Cycles were normal until a year ago. She has skipped up to 2 cycles in a row. In the fall she was getting her cycle 2 x a month, every 2 weeks. Bleeds for 2-5 days. Occasional brown spotting, thinks mid cycle.  Sexually active, no pain. Same partner x 1.5 years. She has had full STD testing.  Period Duration (Days): 5 days  Period Pattern: (!) Irregular Menstrual Flow: Heavy, Moderate Menstrual Control: Tampon Menstrual Control Change Freq (Hours): changes tampon every 4-5 hours  Dysmenorrhea: (!) Moderate Dysmenorrhea Symptoms: Cramping  She has a lipoma on her back has a MRI scheduled for back pain.  She c/o BLQ abdominal pain intermittently, worse with a full bladder, leg raises. The pain started a couple of months ago. Tender to palpate in her lower abdomen. Pain resolves a few minutes after voiding Has mild GSI, only occurs a few times a month.  No urinary frequency, urgency or dysuria.   Patient's last menstrual period was 11/18/2017.          Sexually active: Yes.    The current method of family planning is tubal ligation.    Exercising: No.  The patient does not participate in regular exercise at present. Smoker:  no  Health Maintenance: Pap:  2016 WNL per patient  History of abnormal Pap:  Yes had  Colposcopy- neg  MMG:  03-05-17 WNL  Colonoscopy:  2015 Normal per patient  BMD:   Never TDaP:  01-14-17 Gardasil: N/A   reports that  has never smoked. she has never used smokeless tobacco. She reports that she drinks about 0.6 oz of alcohol per week. She reports that she does not use drugs. Research officer, political party at [employer]. 3 daughters, 79, 68 and 79, 5 grandchildren (all girls). In the process of divorce.  She grew up in [place], has lived in the [place] and [place]. Youngest daughter is getting her masters in music at [employer]  Past Medical History:  Diagnosis Date  . Bilateral edema of lower extremity    tried on low dose lasix by prior provider  . Chicken pox   . Hemorrhoid   . HSV infection   . Hypothyroidism   only rare HSV  Past Surgical History:  Procedure Laterality Date  . APPENDECTOMY  1973  . BREAST LUMPECTOMY Right 2014  . HEMORRHOID SURGERY    . TUBAL LIGATION  2004    Current Outpatient Medications  Medication Sig Dispense Refill  . Boric Acid POWD Apply one suppository vaginally at bedtime before menses or after    . levothyroxine (SYNTHROID) 100 MCG tablet Take 100 mcg by mouth. Brand Name only     No current facility-administered medications for this visit.     Family History  Problem Relation Age of Onset  . Diabetes Mother   . Valvular heart disease Father   . Breast cancer Maternal Aunt        50    Review of Systems  Constitutional: Negative.   HENT: Negative.   Eyes: Negative.   Respiratory: Negative.   Cardiovascular: Negative.   Gastrointestinal: Negative.   Endocrine: Negative.   Genitourinary: Positive for pelvic pain.  Musculoskeletal: Negative.   Skin: Negative.   Allergic/Immunologic: Negative.  Neurological: Negative.   Psychiatric/Behavioral: Negative.     Exam:   BP 124/80 (BP Location: Right Arm, Patient Position: Sitting, Cuff Size: Normal)   Pulse 80   Resp 16   Ht 5\' 4"  (1.626 m)   Wt 172 lb (78 kg)   LMP 11/18/2017   BMI 29.52 kg/m   Weight change: @WEIGHTCHANGE @ Height:   Height: 5\' 4"  (162.6 cm)  Ht Readings from Last 3 Encounters:  12/24/17 5\' 4"  (1.626 m)  01/14/17 5' 4.5" (1.638 m)    General appearance: alert, cooperative and appears stated age Head: Normocephalic, without obvious abnormality, atraumatic Neck: no adenopathy, supple, symmetrical, trachea midline and thyroid normal to inspection and palpation Lungs: clear to auscultation  bilaterally Cardiovascular: regular rate and rhythm Breasts: normal appearance, no masses or tenderness Abdomen: soft, mildly tender BLQ, less tender with palpation with tensed abdominal wall; non distended,  no masses,  no organomegaly Extremities: extremities normal, atraumatic, no cyanosis or edema Skin: Skin color, texture, turgor normal. No rashes or lesions Lymph nodes: Cervical, supraclavicular, and axillary nodes normal. No abnormal inguinal nodes palpated Neurologic: Grossly normal   Pelvic: External genitalia:  no lesions              Urethra:  normal appearing urethra with no masses, tenderness or lesions              Bartholins and Skenes: normal                 Vagina: normal appearing vagina with normal color and discharge, no lesions              Cervix: no cervical motion tenderness and no lesions               Bimanual Exam:  Uterus:  normal sized, mobile, mildly tender.               Adnexa: no masses, mildly tender on the right               Rectovaginal: Confirms               Anus:  normal sphincter tone, no lesions  Bladder: mildly tender  Pelvic floor: not tender  Chaperone was present for exam.  A:  Well Woman with normal exam  H/O oral and genital hsv, only occasional outbreak  Irregular menses, 2 weeks to 3 months  Perimenopause  Vasomotor symptoms  Abdominal/pelvic pain, bladder, uterus and right adnexa are mildly tender, no masses.     P:   TSH  She will do the rest of her blood work with her primary MD  Return for an ultrasound, possible sonohysterogram  Discussed behavioral changes for vasomotor symptoms  Try OTC herbal products and sleep aid.

## 2017-12-24 NOTE — Patient Instructions (Signed)
EXERCISE AND DIET:  We recommended that you start or continue a regular exercise program for good health. Regular exercise means any activity that makes your heart beat faster and makes you sweat.  We recommend exercising at least 30 minutes per day at least 3 days a week, preferably 4 or 5.  We also recommend a diet low in fat and sugar.  Inactivity, poor dietary choices and obesity can cause diabetes, heart attack, stroke, and kidney damage, among others.    ALCOHOL AND SMOKING:  Women should limit their alcohol intake to no more than 7 drinks/beers/glasses of wine (combined, not each!) per week. Moderation of alcohol intake to this level decreases your risk of breast cancer and liver damage. And of course, no recreational drugs are part of a healthy lifestyle.  And absolutely no smoking or even second hand smoke. Most people know smoking can cause heart and lung diseases, but did you know it also contributes to weakening of your bones? Aging of your skin?  Yellowing of your teeth and nails?  CALCIUM AND VITAMIN D:  Adequate intake of calcium and Vitamin D are recommended.  The recommendations for exact amounts of these supplements seem to change often, but generally speaking 600 mg of calcium (either carbonate or citrate) and 800 units of Vitamin D per day seems prudent. Certain women may benefit from higher intake of Vitamin D.  If you are among these women, your doctor will have told you during your visit.    PAP SMEARS:  Pap smears, to check for cervical cancer or precancers,  have traditionally been done yearly, although recent scientific advances have shown that most women can have pap smears less often.  However, every woman still should have a physical exam from her gynecologist every year. It will include a breast check, inspection of the vulva and vagina to check for abnormal growths or skin changes, a visual exam of the cervix, and then an exam to evaluate the size and shape of the uterus and  ovaries.  And after 55 years of age, a rectal exam is indicated to check for rectal cancers. We will also provide age appropriate advice regarding health maintenance, like when you should have certain vaccines, screening for sexually transmitted diseases, bone density testing, colonoscopy, mammograms, etc.   MAMMOGRAMS:  All women over 40 years old should have a yearly mammogram. Many facilities now offer a "3D" mammogram, which may cost around $50 extra out of pocket. If possible,  we recommend you accept the option to have the 3D mammogram performed.  It both reduces the number of women who will be called back for extra views which then turn out to be normal, and it is better than the routine mammogram at detecting truly abnormal areas.    COLONOSCOPY:  Colonoscopy to screen for colon cancer is recommended for all women at age 50.  We know, you hate the idea of the prep.  We agree, BUT, having colon cancer and not knowing it is worse!!  Colon cancer so often starts as a polyp that can be seen and removed at colonscopy, which can quite literally save your life!  And if your first colonoscopy is normal and you have no family history of colon cancer, most women don't have to have it again for 10 years.  Once every ten years, you can do something that may end up saving your life, right?  We will be happy to help you get it scheduled when you are ready.    Be sure to check your insurance coverage so you understand how much it will cost.  It may be covered as a preventative service at no cost, but you should check your particular policy.      Kegel Exercises Kegel exercises help strengthen the muscles that support the rectum, vagina, small intestine, bladder, and uterus. Doing Kegel exercises can help:  Improve bladder and bowel control.  Improve sexual response.  Reduce problems and discomfort during pregnancy.  Kegel exercises involve squeezing your pelvic floor muscles, which are the same muscles you  squeeze when you try to stop the flow of urine. The exercises can be done while sitting, standing, or lying down, but it is best to vary your position. Phase 1 exercises 1. Squeeze your pelvic floor muscles tight. You should feel a tight lift in your rectal area. If you are a female, you should also feel a tightness in your vaginal area. Keep your stomach, buttocks, and legs relaxed. 2. Hold the muscles tight for up to 10 seconds. 3. Relax your muscles. Repeat this exercise 50 times a day or as many times as told by your health care provider. Continue to do this exercise for at least 4-6 weeks or for as long as told by your health care provider. This information is not intended to replace advice given to you by your health care provider. Make sure you discuss any questions you have with your health care provider. Document Released: 10/14/2012 Document Revised: 06/22/2016 Document Reviewed: 09/17/2015 Elsevier Interactive Patient Education  2018 [employer]  

## 2017-12-25 ENCOUNTER — Telehealth: Payer: Self-pay | Admitting: Obstetrics and Gynecology

## 2017-12-25 ENCOUNTER — Other Ambulatory Visit: Payer: Self-pay | Admitting: *Deleted

## 2017-12-25 ENCOUNTER — Telehealth: Payer: Self-pay | Admitting: Family Medicine

## 2017-12-25 ENCOUNTER — Encounter: Payer: Self-pay | Admitting: *Deleted

## 2017-12-25 ENCOUNTER — Other Ambulatory Visit (HOSPITAL_COMMUNITY)
Admission: RE | Admit: 2017-12-25 | Discharge: 2017-12-25 | Disposition: A | Discharge status: Home / Self Care | Payer: No Typology Code available for payment source | Source: Ambulatory Visit | Attending: Obstetrics and Gynecology | Admitting: Obstetrics and Gynecology

## 2017-12-25 DIAGNOSIS — R109 Unspecified abdominal pain: Secondary | ICD-10-CM

## 2017-12-25 DIAGNOSIS — Z124 Encounter for screening for malignant neoplasm of cervix: Secondary | ICD-10-CM | POA: Insufficient documentation

## 2017-12-25 DIAGNOSIS — N926 Irregular menstruation, unspecified: Secondary | ICD-10-CM

## 2017-12-25 DIAGNOSIS — R102 Pelvic and perineal pain: Secondary | ICD-10-CM

## 2017-12-25 LAB — URINALYSIS, MICROSCOPIC ONLY: CASTS: NONE SEEN /LPF

## 2017-12-25 LAB — URINE CULTURE

## 2017-12-25 LAB — TSH: TSH: 0.627 u[IU]/mL (ref 0.450–4.500)

## 2017-12-25 NOTE — Telephone Encounter (Signed)
Spoke with patient reviewed MRI results ,instructions and information. Patient verbalized understanding. Patient states she will hold off on PT and discuss further options at her upcoming appt. She states she will avoid lifting heavy objects and do daily stretching exercises for now.

## 2017-12-25 NOTE — Telephone Encounter (Signed)
Please call pt: - her MRI did not result with any further information on the small mass, which makes the results consistent with lipoma (fatty tumor) and is likely not the cause of her pain.  The 2 lowest levels of her lumbar spine have very small disc bulging, and is the potential cause of her discomfort. Initial approach is typically PT. If she agrees I will place this order for her. Otherwise, we will discuss on ther referrals or potential tx during her upcoming appt.

## 2017-12-25 NOTE — Telephone Encounter (Signed)
°  Spoke with patient regarding benefit for ultrasound with possible sonohysterogram and endometrial biopsy. Patient understood and agreeable. Patient ready to schedule. Patient scheduled 01/06/18 with Dr Oscar LaJertson. Patient aware of appointment date, arrival time and cancellation policy.  Ok to close

## 2017-12-25 NOTE — Addendum Note (Signed)
Addended by: Tobi BastosJERTSON, Miriah Maruyama E on: 12/25/2017 04:35 PM   Modules accepted: Orders

## 2017-12-30 LAB — CYTOLOGY - PAP
DIAGNOSIS: NEGATIVE
HPV: NOT DETECTED

## 2018-01-06 ENCOUNTER — Ambulatory Visit (INDEPENDENT_AMBULATORY_CARE_PROVIDER_SITE_OTHER): Payer: No Typology Code available for payment source

## 2018-01-06 ENCOUNTER — Ambulatory Visit: Payer: No Typology Code available for payment source | Admitting: Obstetrics and Gynecology

## 2018-01-06 ENCOUNTER — Encounter: Payer: Self-pay | Admitting: Obstetrics and Gynecology

## 2018-01-06 ENCOUNTER — Other Ambulatory Visit: Payer: No Typology Code available for payment source

## 2018-01-06 ENCOUNTER — Other Ambulatory Visit: Payer: Self-pay

## 2018-01-06 ENCOUNTER — Other Ambulatory Visit: Payer: Self-pay | Admitting: Obstetrics and Gynecology

## 2018-01-06 VITALS — BP 118/70 | HR 60 | Resp 14 | Wt 178.0 lb

## 2018-01-06 DIAGNOSIS — R109 Unspecified abdominal pain: Secondary | ICD-10-CM

## 2018-01-06 DIAGNOSIS — N951 Menopausal and female climacteric states: Secondary | ICD-10-CM

## 2018-01-06 DIAGNOSIS — N926 Irregular menstruation, unspecified: Secondary | ICD-10-CM

## 2018-01-06 DIAGNOSIS — R102 Pelvic and perineal pain: Secondary | ICD-10-CM

## 2018-01-06 MED ORDER — MEDROXYPROGESTERONE ACETATE 5 MG PO TABS: ORAL_TABLET | ORAL | 1 refills | Status: DC

## 2018-01-06 MED FILL — MEDROXYPROGESTERONE 5 MG TA: 5 | 84 days supply | Qty: 15 | Fill #0

## 2018-01-06 NOTE — Progress Notes (Signed)
GYNECOLOGY  VISIT   HPI: 57 y.o.   Legally Separated  Caucasian  female   [phone] with Patient's last menstrual period was 11/19/2017.   here for follow up RLQ pain and AUB. The patient is perimenopausal and is bleeding every 2-12 weeks. She also has a 2 month h/o intermittent BLQ abdominal pain that is worse with a full bladder or leg raises. She did have mild tenderness with pelvic exam (uterus, right adnexa and bladder) earlier this month.     GYNECOLOGIC HISTORY: Patient's last menstrual period was 11/19/2017. Contraception:tubal ligation  Menopausal hormone therapy: none         OB History    Gravida Para Term Preterm AB Living   5 3 2 1 2 3    SAB TAB Ectopic Multiple Live Births   2       3         Patient Active Problem List   Diagnosis Date Noted  . Mass 12/09/2017  . Encounter for preventive health examination 01/14/2017  . Hypothyroidism 01/14/2017  . Vitamin D deficiency 01/14/2017    Past Medical History:  Diagnosis Date  . Bilateral edema of lower extremity    tried on low dose lasix by prior provider  . Chicken pox   . Hemorrhoid   . HSV infection   . Hypothyroidism     Past Surgical History:  Procedure Laterality Date  . APPENDECTOMY  1973  . BREAST LUMPECTOMY Right 2014  . HEMORRHOID SURGERY    . TUBAL LIGATION  2004    Current Outpatient Medications  Medication Sig Dispense Refill  . Boric Acid POWD Apply one suppository vaginally at bedtime before menses or after    . levothyroxine (SYNTHROID) 100 MCG tablet Take 100 mcg by mouth. Brand Name only    . valACYclovir (VALTREX) 500 MG tablet For oral hsv, take 4 tablets BID for one day. For genital hsv take one tablet BID for 3 days 30 tablet 2   No current facility-administered medications for this visit.      ALLERGIES: Patient has no known allergies.  Family History  Problem Relation Age of Onset  . Diabetes Mother   . Valvular heart disease Father   . Breast cancer Maternal Aunt        [id number]    Social History   Socioeconomic History  . Marital status: Legally Separated    Spouse name: Thereasa DistanceRodney  . Number of children: 5  . Years of education: BA  . Highest education level: Not on file  Social Needs  . Financial resource strain: Not on file  . Food insecurity - worry: Not on file  . Food insecurity - inability: Not on file  . Transportation needs - medical: Not on file  . Transportation needs - non-medical: Not on file  Occupational History  . Occupation: Merchant navy officerractice admin  Tobacco Use  . Smoking status: Never Smoker  . Smokeless tobacco: Never Used  Substance and Sexual Activity  . Alcohol use: Yes    Alcohol/week: 0.6 oz    Types: 1 Glasses of wine per week  . Drug use: No  . Sexual activity: Yes    Partners: Male    Birth control/protection: Surgical    Comment: Married  Other Topics Concern  . Not on file  Social History Narrative   Patient is married to MaxatawnyRodney. They have 5 children btw them.   BA degree. 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  Drinks caffeine.   Wears a seatbelt, wears a bicycle helmet, smoke detector in the home.   Firearms in the home.   Exercises routinely.   Feels safe in her relationships.    Review of Systems  Constitutional:       Weight gain   HENT: Negative.   Eyes: Negative.   Respiratory: Negative.   Cardiovascular: Negative.   Gastrointestinal: Negative.   Genitourinary:       RLQ pain Night sweats   Musculoskeletal: Negative.   Skin: Negative.   Neurological: Negative.   Endo/Heme/Allergies: Negative.   Psychiatric/Behavioral: Negative.     PHYSICAL EXAMINATION:    BP 118/70 (BP Location: Right Arm, Patient Position: Sitting, Cuff Size: Normal)   Pulse 60   Resp 14   Wt 178 lb (80.7 kg)   LMP 11/19/2017   BMI 30.55 kg/m     General appearance: alert, cooperative and appears stated age  Ultrasound images reviewed with the patient  ASSESSMENT Irregular bleeding,  normal ultrasound. Discussed perimenopause and anovulatory cycles RLQ abdominal pain, worse with a full bladder or leg raises. Etiology not clear. Negative urine culture Not tender during the ultrasound    PLAN Calendar bleeding F/U in 4 months  Provera 5 mg x 5 days now and every other month if no spontaneous menses   An After Visit Summary was printed and given to the patient.

## 2018-01-07 ENCOUNTER — Encounter: Payer: Self-pay | Admitting: Obstetrics and Gynecology

## 2018-01-09 MED FILL — VALACYCLOVIR HCL 500 MG TAB: 500 | 30 days supply | Qty: 30 | Fill #0

## 2018-01-14 ENCOUNTER — Ambulatory Visit (INDEPENDENT_AMBULATORY_CARE_PROVIDER_SITE_OTHER): Payer: No Typology Code available for payment source | Admitting: Family Medicine

## 2018-01-14 ENCOUNTER — Encounter: Payer: Self-pay | Admitting: Family Medicine

## 2018-01-14 VITALS — BP 109/70 | HR 66 | Temp 97.7°F | Ht 64.7 in | Wt 175.1 lb

## 2018-01-14 DIAGNOSIS — E559 Vitamin D deficiency, unspecified: Secondary | ICD-10-CM

## 2018-01-14 DIAGNOSIS — M545 Low back pain, unspecified: Secondary | ICD-10-CM

## 2018-01-14 DIAGNOSIS — Z79899 Other long term (current) drug therapy: Secondary | ICD-10-CM

## 2018-01-14 DIAGNOSIS — E039 Hypothyroidism, unspecified: Secondary | ICD-10-CM | POA: Diagnosis not present

## 2018-01-14 DIAGNOSIS — Z0000 Encounter for general adult medical examination without abnormal findings: Secondary | ICD-10-CM

## 2018-01-14 DIAGNOSIS — Z131 Encounter for screening for diabetes mellitus: Secondary | ICD-10-CM

## 2018-01-14 DIAGNOSIS — R232 Flushing: Secondary | ICD-10-CM | POA: Diagnosis not present

## 2018-01-14 DIAGNOSIS — Z130 Encounter for screening for diseases of the blood and blood-forming organs and certain disorders involving the immune mechanism: Secondary | ICD-10-CM

## 2018-01-14 LAB — CBC WITH DIFFERENTIAL/PLATELET
BASOS ABS: 0 10*3/uL (ref 0.0–0.1)
Basophils Relative: 0.5 % (ref 0.0–3.0)
EOS PCT: 2.4 % (ref 0.0–5.0)
Eosinophils Absolute: 0.1 10*3/uL (ref 0.0–0.7)
HEMATOCRIT: 36.3 % (ref 36.0–46.0)
Hemoglobin: 12 g/dL (ref 12.0–15.0)
LYMPHS ABS: 2.1 10*3/uL (ref 0.7–4.0)
LYMPHS PCT: 37.1 % (ref 12.0–46.0)
MCHC: 33 g/dL (ref 30.0–36.0)
MCV: 88.3 fl (ref 78.0–100.0)
Monocytes Absolute: 0.6 10*3/uL (ref 0.1–1.0)
Monocytes Relative: 10.9 % (ref 3.0–12.0)
NEUTROS ABS: 2.8 10*3/uL (ref 1.4–7.7)
NEUTROS PCT: 49.1 % (ref 43.0–77.0)
PLATELETS: 329 10*3/uL (ref 150.0–400.0)
RBC: 4.11 Mil/uL (ref 3.87–5.11)
RDW: 14 % (ref 11.5–15.5)
WBC: 5.7 10*3/uL (ref 4.0–10.5)

## 2018-01-14 LAB — COMPREHENSIVE METABOLIC PANEL
ALK PHOS: 53 U/L (ref 39–117)
ALT: 11 U/L (ref 0–35)
AST: 15 U/L (ref 0–37)
Albumin: 4.1 g/dL (ref 3.5–5.2)
BILIRUBIN TOTAL: 0.3 mg/dL (ref 0.2–1.2)
BUN: 18 mg/dL (ref 6–23)
CALCIUM: 9.5 mg/dL (ref 8.4–10.5)
CO2: 27 mEq/L (ref 19–32)
Chloride: 104 mEq/L (ref 96–112)
Creatinine, Ser: 0.64 mg/dL (ref 0.40–1.20)
GFR: 102.41 mL/min (ref >60.00)
Glucose, Bld: 74 mg/dL (ref 70–99)
POTASSIUM: 4.1 meq/L (ref 3.5–5.1)
Sodium: 138 mEq/L (ref 135–145)
TOTAL PROTEIN: 7 g/dL (ref 6.0–8.3)

## 2018-01-14 LAB — VITAMIN D 25 HYDROXY (VIT D DEFICIENCY, FRACTURES): VITD: 14.85 ng/mL — ABNORMAL LOW (ref 30.00–100.00)

## 2018-01-14 LAB — HEMOGLOBIN A1C: HEMOGLOBIN A1C: 5.8 % (ref 4.6–6.5)

## 2018-01-14 MED ORDER — CITALOPRAM HYDROBROMIDE 20 MG PO TABS: 20.0000 mg | ORAL_TABLET | Freq: Every day | ORAL | 1 refills | Status: DC

## 2018-01-14 MED ORDER — DICLOFENAC SODIUM ER 100 MG PO TB24: 100.0000 mg | ORAL_TABLET | Freq: Every day | ORAL | 3 refills | Status: DC

## 2018-01-14 MED ORDER — LEVOTHYROXINE SODIUM 100 MCG PO TABS: 100.0000 ug | ORAL_TABLET | Freq: Every day | ORAL | 3 refills | Status: DC

## 2018-01-14 MED FILL — CITALOPRAM HBR 20 MG TABLET: 20 | 90 days supply | Qty: 90 | Fill #0

## 2018-01-14 MED FILL — DICLOFENAC SOD ER 100 MG TA: 100 | 90 days supply | Qty: 90 | Fill #0

## 2018-01-14 NOTE — Patient Instructions (Signed)
Start celexa daily for hot flashes  Diclofenac daily for low back pain.    Health Maintenance, Female Adopting a healthy lifestyle and getting preventive care can go a long way to promote health and wellness. Talk with your health care provider about what schedule of regular examinations is right for you. This is a good chance for you to check in with your provider about disease prevention and staying healthy. In between checkups, there are plenty of things you can do on your own. Experts have done a lot of research about which lifestyle changes and preventive measures are most likely to keep you healthy. Ask your health care provider for more information. Weight and diet Eat a healthy diet  Be sure to include plenty of vegetables, fruits, low-fat dairy products, and lean protein.  Do not eat a lot of foods high in solid fats, added sugars, or salt.  Get regular exercise. This is one of the most important things you can do for your health. ? Most adults should exercise for at least 150 minutes each week. The exercise should increase your heart rate and make you sweat (moderate-intensity exercise). ? Most adults should also do strengthening exercises at least twice a week. This is in addition to the moderate-intensity exercise.  Maintain a healthy weight  Body mass index (BMI) is a measurement that can be used to identify possible weight problems. It estimates body fat based on height and weight. Your health care provider can help determine your BMI and help you achieve or maintain a healthy weight.  For females 15 years of age and older: ? A BMI below 18.5 is considered underweight. ? A BMI of 18.5 to 24.9 is normal. ? A BMI of 25 to 29.9 is considered overweight. ? A BMI of 30 and above is considered obese.  Watch levels of cholesterol and blood lipids  You should start having your blood tested for lipids and cholesterol at 55 years of age, then have this test every 5 years.  You may  need to have your cholesterol levels checked more often if: ? Your lipid or cholesterol levels are high. ? You are older than 55 years of age. ? You are at high risk for heart disease.  Cancer screening Lung Cancer  Lung cancer screening is recommended for adults 39-106 years old who are at high risk for lung cancer because of a history of smoking.  A yearly low-dose CT scan of the lungs is recommended for people who: ? Currently smoke. ? Have quit within the past 15 years. ? Have at least a 30-pack-year history of smoking. A pack year is smoking an average of one pack of cigarettes a day for 1 year.  Yearly screening should continue until it has been 15 years since you quit.  Yearly screening should stop if you develop a health problem that would prevent you from having lung cancer treatment.  Breast Cancer  Practice breast self-awareness. This means understanding how your breasts normally appear and feel.  It also means doing regular breast self-exams. Let your health care provider know about any changes, no matter how small.  If you are in your 20s or 30s, you should have a clinical breast exam (CBE) by a health care provider every 1-3 years as part of a regular health exam.  If you are 31 or older, have a CBE every year. Also consider having a breast X-ray (mammogram) every year.  If you have a family history of breast cancer,  talk to your health care provider about genetic screening.  If you are at high risk for breast cancer, talk to your health care provider about having an MRI and a mammogram every year.  Breast cancer gene (BRCA) assessment is recommended for women who have family members with BRCA-related cancers. BRCA-related cancers include: ? Breast. ? Ovarian. ? Tubal. ? Peritoneal cancers.  Results of the assessment will determine the need for genetic counseling and BRCA1 and BRCA2 testing.  Cervical Cancer Your health care provider may recommend that you be  screened regularly for cancer of the pelvic organs (ovaries, uterus, and vagina). This screening involves a pelvic examination, including checking for microscopic changes to the surface of your cervix (Pap test). You may be encouraged to have this screening done every 3 years, beginning at age 21.  For women ages 29-65, health care providers may recommend pelvic exams and Pap testing every 3 years, or they may recommend the Pap and pelvic exam, combined with testing for human papilloma virus (HPV), every 5 years. Some types of HPV increase your risk of cervical cancer. Testing for HPV may also be done on women of any age with unclear Pap test results.  Other health care providers may not recommend any screening for nonpregnant women who are considered low risk for pelvic cancer and who do not have symptoms. Ask your health care provider if a screening pelvic exam is right for you.  If you have had past treatment for cervical cancer or a condition that could lead to cancer, you need Pap tests and screening for cancer for at least 20 years after your treatment. If Pap tests have been discontinued, your risk factors (such as having a new sexual partner) need to be reassessed to determine if screening should resume. Some women have medical problems that increase the chance of getting cervical cancer. In these cases, your health care provider may recommend more frequent screening and Pap tests.  Colorectal Cancer  This type of cancer can be detected and often prevented.  Routine colorectal cancer screening usually begins at 55 years of age and continues through 55 years of age.  Your health care provider may recommend screening at an earlier age if you have risk factors for colon cancer.  Your health care provider may also recommend using home test kits to check for hidden blood in the stool.  A small camera at the end of a tube can be used to examine your colon directly (sigmoidoscopy or colonoscopy).  This is done to check for the earliest forms of colorectal cancer.  Routine screening usually begins at age 92.  Direct examination of the colon should be repeated every 5-10 years through 55 years of age. However, you may need to be screened more often if early forms of precancerous polyps or small growths are found.  Skin Cancer  Check your skin from head to toe regularly.  Tell your health care provider about any new moles or changes in moles, especially if there is a change in a mole's shape or color.  Also tell your health care provider if you have a mole that is larger than the size of a pencil eraser.  Always use sunscreen. Apply sunscreen liberally and repeatedly throughout the day.  Protect yourself by wearing long sleeves, pants, a wide-brimmed hat, and sunglasses whenever you are outside.  Heart disease, diabetes, and high blood pressure  High blood pressure causes heart disease and increases the risk of stroke. High blood pressure is  more likely to develop in: ? People who have blood pressure in the high end of the normal range (130-139/85-89 mm Hg). ? People who are overweight or obese. ? People who are African American.  If you are 47-80 years of age, have your blood pressure checked every 3-5 years. If you are 94 years of age or older, have your blood pressure checked every year. You should have your blood pressure measured twice-once when you are at a hospital or clinic, and once when you are not at a hospital or clinic. Record the average of the two measurements. To check your blood pressure when you are not at a hospital or clinic, you can use: ? An automated blood pressure machine at a pharmacy. ? A home blood pressure monitor.  If you are between 34 years and 10 years old, ask your health care provider if you should take aspirin to prevent strokes.  Have regular diabetes screenings. This involves taking a blood sample to check your fasting blood sugar level. ? If  you are at a normal weight and have a low risk for diabetes, have this test once every three years after 55 years of age. ? If you are overweight and have a high risk for diabetes, consider being tested at a younger age or more often. Preventing infection Hepatitis B  If you have a higher risk for hepatitis B, you should be screened for this virus. You are considered at high risk for hepatitis B if: ? You were born in a country where hepatitis B is common. Ask your health care provider which countries are considered high risk. ? Your parents were born in a high-risk country, and you have not been immunized against hepatitis B (hepatitis B vaccine). ? You have HIV or AIDS. ? You use needles to inject street drugs. ? You live with someone who has hepatitis B. ? You have had sex with someone who has hepatitis B. ? You get hemodialysis treatment. ? You take certain medicines for conditions, including cancer, organ transplantation, and autoimmune conditions.  Hepatitis C  Blood testing is recommended for: ? Everyone born from [birth year] through 1963. ? Anyone with known risk factors for hepatitis C.  Sexually transmitted infections (STIs)  You should be screened for sexually transmitted infections (STIs) including gonorrhea and chlamydia if: ? You are sexually active and are younger than 55 years of age. ? You are older than 55 years of age and your health care provider tells you that you are at risk for this type of infection. ? Your sexual activity has changed since you were last screened and you are at an increased risk for chlamydia or gonorrhea. Ask your health care provider if you are at risk.  If you do not have HIV, but are at risk, it may be recommended that you take a prescription medicine daily to prevent HIV infection. This is called pre-exposure prophylaxis (PrEP). You are considered at risk if: ? You are sexually active and do not regularly use condoms or know the HIV status of your  partner(s). ? You take drugs by injection. ? You are sexually active with a partner who has HIV.  Talk with your health care provider about whether you are at high risk of being infected with HIV. If you choose to begin PrEP, you should first be tested for HIV. You should then be tested every 3 months for as long as you are taking PrEP. Pregnancy  If you are premenopausal and you  may become pregnant, ask your health care provider about preconception counseling.  If you may become pregnant, take 400 to 800 micrograms (mcg) of folic acid every day.  If you want to prevent pregnancy, talk to your health care provider about birth control (contraception). Osteoporosis and menopause  Osteoporosis is a disease in which the bones lose minerals and strength with aging. This can result in serious bone fractures. Your risk for osteoporosis can be identified using a bone density scan.  If you are 59 years of age or older, or if you are at risk for osteoporosis and fractures, ask your health care provider if you should be screened.  Ask your health care provider whether you should take a calcium or vitamin D supplement to lower your risk for osteoporosis.  Menopause may have certain physical symptoms and risks.  Hormone replacement therapy may reduce some of these symptoms and risks. Talk to your health care provider about whether hormone replacement therapy is right for you. Follow these instructions at home:  Schedule regular health, dental, and eye exams.  Stay current with your immunizations.  Do not use any tobacco products including cigarettes, chewing tobacco, or electronic cigarettes.  If you are pregnant, do not drink alcohol.  If you are breastfeeding, limit how much and how often you drink alcohol.  Limit alcohol intake to no more than 1 drink per day for nonpregnant women. One drink equals 12 ounces of beer, 5 ounces of wine, or 1 ounces of hard liquor.  Do not use street  drugs.  Do not share needles.  Ask your health care provider for help if you need support or information about quitting drugs.  Tell your health care provider if you often feel depressed.  Tell your health care provider if you have ever been abused or do not feel safe at home. This information is not intended to replace advice given to you by your health care provider. Make sure you discuss any questions you have with your health care provider. Document Released: 05/13/2011 Document Revised: 04/04/2016 Document Reviewed: 08/01/2015 Elsevier Interactive Patient Education  [employer].

## 2018-01-14 NOTE — Progress Notes (Signed)
Patient ID: Genova Kiner, female  DOB: 12-19-1960, 57 y.o.   MRN: [id number] Patient Care Team    Relationship Specialty Notifications Start End  Natalia Leatherwood, DO PCP - General Family Medicine  01/14/17   Romualdo Bolk, MD Consulting Physician Obstetrics and Gynecology  01/14/18     Chief Complaint  Patient presents with  . Annual Exam    CPE    Subjective:  Davisha Linthicum is a 57 y.o.  Female  present for CPE. All past medical history, surgical history, allergies, family history, immunizations, medications and social history were updated in the electronic medical record today. All recent labs, ED visits and hospitalizations within the last year were reviewed.  Lumbar pain: pt still complains of daily lumbar pain. She has had work up and MRI. She is agreeable to try daily NSAID prescribed now.   Hot flashes: pt has seen her gyn and told she is in perimenopause. Pt reports her hot flashes are getting worse. She does not think she wants to take hormones. Hot flashes is her only menopausal complaint. She is open to SSRI trial.   Health maintenance: updated 01/14/2018 Colonoscopy: no fhx, screen completed 01/21/2014,  at ? Provider in [place].  Mammogram: completed: 03/05/2017, birads 1.  Cervical cancer screening: last pap: 2019, results: Dr. Oscar La  Neg hpv/nl pap. Immunizations: tdap 01/2017 UTD. Influenza UTD 2018(encouraged yearly) Infectious disease: HIV & Hep C completed Assistive device: None Oxygen use: None Patient has a Dental home. Hospitalizations/ED visits: None  Depression screen [facility] 2/9 01/14/2018 12/19/2017 12/09/2017 01/14/2017  Decreased Interest 0 0 0 0  Down, Depressed, Hopeless 0 0 0 0  PHQ - 2 Score 0 0 0 0   No flowsheet data found.   Current Exercise Habits: Structured exercise class, Type of exercise: Other - see comments;walking, Time (Minutes): 30, Frequency (Times/Week): 4, Weekly Exercise (Minutes/Week): 120, Intensity: Mild     Immunization  History  Administered Date(s) Administered  . Hepatitis A, Ped/Adol-2 Dose 12/07/2012  . Influenza, Seasonal, Injecte, Preservative Fre 09/15/2014  . Influenza-Unspecified 08/11/2016  . Tdap 01/14/2017     Past Medical History:  Diagnosis Date  . Bilateral edema of lower extremity    tried on low dose lasix by prior provider  . Chicken pox   . Hemorrhoid   . HSV infection   . Hypothyroidism    No Known Allergies Past Surgical History:  Procedure Laterality Date  . APPENDECTOMY  1973  . BREAST LUMPECTOMY Right 2014  . HEMORRHOID SURGERY    . TUBAL LIGATION  2004   Family History  Problem Relation Age of Onset  . Diabetes Mother   . Valvular heart disease Father   . Breast cancer Maternal Aunt        43   Social History   Socioeconomic History  . Marital status: Legally Separated    Spouse name: Thereasa Distance  . Number of children: 5  . Years of education: BA  . Highest education level: Not on file  Social Needs  . Financial resource strain: Not on file  . Food insecurity - worry: Not on file  . Food insecurity - inability: Not on file  . Transportation needs - medical: Not on file  . Transportation needs - non-medical: Not on file  Occupational History  . Occupation: Merchant navy officer  Tobacco Use  . Smoking status: Never Smoker  . Smokeless tobacco: Never Used  Substance and Sexual Activity  . Alcohol use: Yes  Alcohol/week: 0.6 oz    Types: 1 Glasses of wine per week  . Drug use: No  . Sexual activity: Yes    Partners: Male    Birth control/protection: Surgical    Comment: Married  Other Topics Concern  . Not on file  Social History Narrative   Patient is married to [place]. They have 5 children btw them.   BA degree. 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   Drinks caffeine.   Wears a seatbelt, wears a bicycle helmet, smoke detector in the home.   Firearms in the home.   Exercises routinely.   Feels safe in her  relationships.   Allergies as of 01/14/2018   No Known Allergies     Medication List        Accurate as of 01/14/18  2:38 PM. Always use your most recent med list.          Boric Acid Powd Apply one suppository vaginally at bedtime before menses or after   citalopram 20 MG tablet Commonly known as:  CELEXA Take 1 tablet (20 mg total) by mouth daily.   Diclofenac Sodium CR 100 MG 24 hr tablet Take 1 tablet (100 mg total) by mouth daily.   levothyroxine 100 MCG tablet Commonly known as:  SYNTHROID Take 1 tablet (100 mcg total) by mouth daily before breakfast. Brand Name only   medroxyPROGESTERone 5 MG tablet Commonly known as:  PROVERA Take one tablet a day for 5 days now and every other month if no spontaneous menses   valACYclovir 500 MG tablet Commonly known as:  VALTREX For oral hsv, take 4 tablets BID for one day. For genital hsv take one tablet BID for 3 days      All past medical history, surgical history, allergies, family history, immunizations andmedications were updated in the EMR today and reviewed under the history and medication portions of their EMR.     Recent Results (from the past 2160 hour(s))  POC Influenza A&B (Binax test)     Status: Normal   Collection Time: 12/19/17 11:13 AM  Result Value Ref Range   Influenza A, POC Negative Negative   Influenza B, POC Negative Negative  POCT Urinalysis Dipstick     Status: Abnormal   Collection Time: 12/24/17 10:27 AM  Result Value Ref Range   Color, UA yellow    Clarity, UA clear    Glucose, UA neg    Bilirubin, UA neg    Ketones, UA neg    Spec Grav, UA  1.010 - 1.025   Blood, UA neg    pH, UA 5.0 5.0 - 8.0   Protein, UA neg    Urobilinogen, UA negative (A) 0.2 or 1.0 E.U./dL   Nitrite, UA neg    Leukocytes, UA Negative Negative   Appearance     Odor    Urine Culture     Status: None   Collection Time: 12/24/17  2:01 PM  Result Value Ref Range   Urine Culture, Routine Final report    Organism  ID, Bacteria Comment     Comment: Culture shows less than 10,000 colony forming units of bacteria per milliliter of urine. This colony count is not generally considered to be clinically significant.   Urine Microscopic     Status: None   Collection Time: 12/24/17  2:16 PM  Result Value Ref Range   WBC, UA 0-5 0 - 5 /hpf   RBC, UA 0-2 0 -  2 /hpf   Epithelial Cells (non renal) 0-10 0 - 10 /hpf   Casts None seen None seen /lpf   Mucus, UA Present Not Estab.   Bacteria, UA Few None seen/Few  TSH     Status: None   Collection Time: 12/24/17  2:48 PM  Result Value Ref Range   TSH 0.627 0.450 - 4.500 uIU/mL  Cytology - PAP     Status: None   Collection Time: 12/25/17 12:00 AM  Result Value Ref Range   Adequacy      Satisfactory for evaluation  endocervical/transformation zone component PRESENT.   Diagnosis      NEGATIVE FOR INTRAEPITHELIAL LESIONS OR MALIGNANCY.   HPV NOT DETECTED     Comment: Normal Reference Range - NOT Detected   Material Submitted CervicoVaginal Pap [ThinPrep Imaged]    CYTOLOGY - PAP PAP RESULT     No results found.   ROS: 14 pt review of systems performed and negative (unless mentioned in an HPI)  Objective: BP 109/70 (BP Location: Right Arm, Patient Position: Sitting, Cuff Size: Normal)   Pulse 66   Temp 97.7 F (36.5 C) (Oral)   Ht 5' 4.7" (1.643 m)   Wt 175 lb 1.9 oz (79.4 kg)   LMP 12/20/2017   SpO2 99%   BMI 29.41 kg/m  Gen: Afebrile. No acute distress. Nontoxic in appearance, well-developed, well-nourished,  pleasant caucasian female.  HENT: AT. [place]. Bilateral TM visualized and normal in appearance, normal external auditory canal. MMM, no oral lesions, adequate dentition. Bilateral nares within normal limits. Throat without erythema, ulcerations or exudates. no Cough on exam, no hoarseness on exam. Eyes:Pupils Equal Round Reactive to light, Extraocular movements intact,  Conjunctiva without redness, discharge or icterus. Neck/lymp/endocrine:  Supple,no lymphadenopathy, no thyromegaly CV: RRR no murmur, no edema, +2/4 P posterior tibialis pulses. no carotid bruits. No JVD. Chest: CTAB, no wheeze, rhonchi or crackles. Normal  Respiratory effort. good Air movement. Abd: Soft. round. NTND. BS present. no Masses palpated. No hepatosplenomegaly. No rebound tenderness or guarding. Skin: no rashes, purpura or petechiae. Warm and well-perfused. Skin intact. Neuro/Msk:  Normal gait. PERLA. EOMi. Alert. Oriented x3.  Cranial nerves II through XII intact. Muscle strength 5/5 upper/lower extremity. DTRs equal bilaterally. Psych: Normal affect, dress and demeanor. Normal speech. Normal thought content and judgment.   No exam data present  Assessment/plan: Lakeithia Rasor is a 57 y.o. female present for CPE. Hypothyroidism, unspecified type - refills on synthroid 100 mcg QD. TSH normal 12/2017. Encounter for preventive health examination Patient was encouraged to exercise greater than 150 minutes a week. Patient was encouraged to choose a diet filled with fresh fruits and vegetables, and lean meats. AVS provided to patient today for education/recommendation on gender specific health and safety maintenance. Colonoscopy: no fhx, screen completed 01/21/2014,  at ? Provider in [place].  Mammogram: completed: 03/05/2017, birads 1. --> pt encouraged to schedule.  Cervical cancer screening: last pap: 2019, results: Dr. Oscar La  Neg hpv/nl pap. Immunizations: tdap 01/2017 UTD. Influenza UTD 2018(encouraged yearly) Infectious disease: HIV & Hep C completed Anemia screen: CBC Diabetes screen: A1c  Vitamin D deficiency - currently not taking supplement.  Lumbar pain Trial of diclofenac QD prescribed F/u 6 months, sooner if needed.  Encounter for long-term (current) use of medications CMP Hot flashes: - trial of celexa 20 mg QD.  - f/u 6 months sooner if needed.    Return in about 6 months (around 07/17/2018).  Electronically signed by: Felix Pacini, DO [facility] [place]

## 2018-01-15 ENCOUNTER — Telehealth: Payer: Self-pay | Admitting: Family Medicine

## 2018-01-15 ENCOUNTER — Encounter: Payer: Self-pay | Admitting: *Deleted

## 2018-01-15 NOTE — Telephone Encounter (Signed)
Please inform patient the following information: Labs are normal with the following exceptions:  -vit d is very low again at 14. I have called in the 50000u to take once a week for 12 weeks. She should also start 2000u daily OTC vit d and continue taking even after prescribed vit d completed. She should take OTC vit d indefinitely, given how low her vit d has test without supplement.  - her diabetes screen is also elevated at 5.8, this is the prediabetes range. No medications are needed at this time, but it does warrant continue surveillance every 6 months. Cutting carbs back and increasing exercise will help prevent progression to diabetes.

## 2018-01-15 NOTE — Telephone Encounter (Signed)
Spoke with patient reviewed lab results and instructions. Patient verbalized understanding. 

## 2018-01-16 ENCOUNTER — Encounter: Payer: 59 | Admitting: Family Medicine

## 2018-02-17 ENCOUNTER — Encounter: Payer: Self-pay | Admitting: Family Medicine

## 2018-02-17 MED FILL — SYNTHROID 100 MCG TABLET: 100 | 90 days supply | Qty: 90 | Fill #0

## 2018-02-18 ENCOUNTER — Telehealth: Payer: Self-pay | Admitting: Family Medicine

## 2018-02-18 DIAGNOSIS — R232 Flushing: Secondary | ICD-10-CM

## 2018-02-18 MED ORDER — CITALOPRAM HYDROBROMIDE 40 MG PO TABS: 40.0000 mg | ORAL_TABLET | Freq: Every day | ORAL | 0 refills | Status: DC

## 2018-02-18 NOTE — Telephone Encounter (Signed)
See echart message. She can increase her celexa to 40 mg a day (2 tabs) to finish the bottle she currently has. I have called in the increase dose scrip to her pharmacy (take 1 of new bottle). Keep routine f/u

## 2018-02-18 NOTE — Telephone Encounter (Signed)
Spoke with patient reviewed  instructions. Patient verbalized understanding. 

## 2018-03-18 MED FILL — CITALOPRAM HBR 40 MG TABLET: 40 | 90 days supply | Qty: 90 | Fill #0

## 2018-04-07 MED FILL — DICLOFENAC SOD ER 100 MG TA: 100 | 90 days supply | Qty: 90 | Fill #1

## 2018-05-06 ENCOUNTER — Encounter: Payer: Self-pay | Admitting: Obstetrics and Gynecology

## 2018-05-06 ENCOUNTER — Other Ambulatory Visit: Payer: Self-pay

## 2018-05-06 ENCOUNTER — Ambulatory Visit (INDEPENDENT_AMBULATORY_CARE_PROVIDER_SITE_OTHER): Payer: No Typology Code available for payment source | Admitting: Obstetrics and Gynecology

## 2018-05-06 VITALS — BP 120/80 | HR 64 | Resp 14 | Wt 179.0 lb

## 2018-05-06 DIAGNOSIS — N951 Menopausal and female climacteric states: Secondary | ICD-10-CM | POA: Diagnosis not present

## 2018-05-06 NOTE — Progress Notes (Signed)
GYNECOLOGY  VISIT   HPI: 57 y.o.   Legally Separated  Caucasian  female   [phone] with No LMP recorded (lmp unknown).   here for follow up on perimenopausal bleeding. She was seen at the end of February and was having irregular bleeding every 2-12 weeks and intermittent lower abdominal pain. She had a normal ultrasound, normal pap and normal TSH. She was given cyclic provera to take as needed and is here for f/u. She took the provera in March only had some spotting. She had light spotting again this month. Hasn't taken the provera again yet.  She was diagnosed with a bulging disc in her back, felt to be the cause of the pain. Her belly pain is better, only tender when she needs to void.  Her primary started her on Celexa for hot flashes, it's helping.   GYNECOLOGIC HISTORY: No LMP recorded (lmp unknown). Contraception:tubal ligation  Menopausal hormone therapy: none         OB History    Gravida  5   Para  3   Term  2   Preterm  1   AB  2   Living  3     SAB  2   TAB      Ectopic      Multiple      Live Births  3              Patient Active Problem List   Diagnosis Date Noted  . Lumbar pain 01/14/2018  . Mass 12/09/2017  . Encounter for preventive health examination 01/14/2017  . Hypothyroidism 01/14/2017  . Vitamin D deficiency 01/14/2017    Past Medical History:  Diagnosis Date  . Bilateral edema of lower extremity    tried on low dose lasix by prior provider  . Chicken pox   . Hemorrhoid   . HSV infection   . Hypothyroidism     Past Surgical History:  Procedure Laterality Date  . APPENDECTOMY  1973  . BREAST LUMPECTOMY Right 2014  . HEMORRHOID SURGERY    . TUBAL LIGATION  2004    Current Outpatient Medications  Medication Sig Dispense Refill  . Boric Acid POWD Apply one suppository vaginally at bedtime before menses or after    . citalopram (CELEXA) 40 MG tablet Take 1 tablet (40 mg total) by mouth daily. 90 tablet 0  . Diclofenac Sodium CR  100 MG 24 hr tablet Take 1 tablet (100 mg total) by mouth daily. 90 tablet 3  . levothyroxine (SYNTHROID) 100 MCG tablet Take 1 tablet (100 mcg total) by mouth daily before breakfast. Brand Name only 90 tablet 3  . valACYclovir (VALTREX) 500 MG tablet For oral hsv, take 4 tablets BID for one day. For genital hsv take one tablet BID for 3 days 30 tablet 2  . medroxyPROGESTERone (PROVERA) 5 MG tablet Take one tablet a day for 5 days now and every other month if no spontaneous menses (Patient not taking: Reported on 05/06/2018) 15 tablet 1   No current facility-administered medications for this visit.      ALLERGIES: Patient has no known allergies.  Family History  Problem Relation Age of Onset  . Diabetes Mother   . Valvular heart disease Father   . Breast cancer Maternal Aunt        4850    Social History   Socioeconomic History  . Marital status: Legally Separated    Spouse name: Thereasa DistanceRodney  . Number of children: 5  .  Years of education: BA  . Highest education level: Not on file  Occupational History  . Occupation: Merchant navy officer  Social Needs  . Financial resource strain: Not on file  . Food insecurity:    Worry: Not on file    Inability: Not on file  . Transportation needs:    Medical: Not on file    Non-medical: Not on file  Tobacco Use  . Smoking status: Never Smoker  . Smokeless tobacco: Never Used  Substance and Sexual Activity  . Alcohol use: Yes    Alcohol/week: 0.6 oz    Types: 1 Glasses of wine per week  . Drug use: No  . Sexual activity: Yes    Partners: Male    Birth control/protection: Surgical    Comment: Married  Lifestyle  . Physical activity:    Days per week: Not on file    Minutes per session: Not on file  . Stress: Not on file  Relationships  . Social connections:    Talks on phone: Not on file    Gets together: Not on file    Attends religious service: Not on file    Active member of club or organization: Not on file    Attends meetings of  clubs or organizations: Not on file    Relationship status: Not on file  . Intimate partner violence:    Fear of current or ex partner: Not on file    Emotionally abused: Not on file    Physically abused: Not on file    Forced sexual activity: Not on file  Other Topics Concern  . Not on file  Social History Narrative   Patient is married to [place]. They have 5 children btw them.   BA degree. 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   Drinks caffeine.   Wears a seatbelt, wears a bicycle helmet, smoke detector in the home.   Firearms in the home.   Exercises routinely.   Feels safe in her relationships.    Review of Systems  Constitutional: Negative.   HENT: Negative.   Eyes: Negative.   Respiratory: Negative.   Cardiovascular: Negative.   Gastrointestinal: Negative.   Genitourinary:       Amenorrhea   Musculoskeletal: Negative.   Skin: Negative.   Neurological: Negative.   Endo/Heme/Allergies: Negative.   Psychiatric/Behavioral: Negative.     PHYSICAL EXAMINATION:    BP 120/80 (BP Location: Right Arm, Patient Position: Sitting, Cuff Size: Normal)   Pulse 64   Resp 14   Wt 179 lb (81.2 kg)   LMP  (LMP Unknown)   BMI 30.06 kg/m     General appearance: alert, cooperative and appears stated age  ASSESSMENT Perimenopausal, hasn't taken the provera since March. Light spotting this month Abdominal pain has improved Vasomotor symptoms, primary put her on Celexa, helping    PLAN Advised her to take the provera now and every other month if no spontaneous menses until she goes 6 months without bleeding Call with any concerns, otherwise f/u for an annual exam in 2/19.   An After Visit Summary was printed and given to the patient.

## 2018-05-21 MED FILL — SYNTHROID 100 MCG TABLET: 100 | 90 days supply | Qty: 90 | Fill #1

## 2018-06-24 ENCOUNTER — Other Ambulatory Visit: Payer: Self-pay | Admitting: Family Medicine

## 2018-06-24 DIAGNOSIS — R232 Flushing: Secondary | ICD-10-CM

## 2018-06-24 MED FILL — CITALOPRAM HBR 40 MG TABLET: 40 | 30 days supply | Qty: 30 | Fill #0

## 2018-07-15 MED FILL — DICLOFENAC SOD ER 100 MG TA: 100 | 90 days supply | Qty: 90 | Fill #2

## 2018-07-22 ENCOUNTER — Encounter: Payer: Self-pay | Admitting: Family Medicine

## 2018-07-22 ENCOUNTER — Ambulatory Visit (INDEPENDENT_AMBULATORY_CARE_PROVIDER_SITE_OTHER): Payer: No Typology Code available for payment source | Admitting: Family Medicine

## 2018-07-22 ENCOUNTER — Telehealth: Payer: Self-pay | Admitting: Family Medicine

## 2018-07-22 VITALS — BP 109/71 | HR 65 | Temp 97.9°F | Resp 20 | Ht 65.0 in | Wt 175.0 lb

## 2018-07-22 DIAGNOSIS — R232 Flushing: Secondary | ICD-10-CM | POA: Diagnosis not present

## 2018-07-22 DIAGNOSIS — M545 Low back pain, unspecified: Secondary | ICD-10-CM

## 2018-07-22 DIAGNOSIS — E039 Hypothyroidism, unspecified: Secondary | ICD-10-CM | POA: Diagnosis not present

## 2018-07-22 DIAGNOSIS — R1310 Dysphagia, unspecified: Secondary | ICD-10-CM

## 2018-07-22 DIAGNOSIS — E559 Vitamin D deficiency, unspecified: Secondary | ICD-10-CM | POA: Diagnosis not present

## 2018-07-22 DIAGNOSIS — R7309 Other abnormal glucose: Secondary | ICD-10-CM | POA: Diagnosis not present

## 2018-07-22 HISTORY — DX: Dysphagia, unspecified: R13.10

## 2018-07-22 LAB — POCT GLYCOSYLATED HEMOGLOBIN (HGB A1C): Hemoglobin A1C: 5.4 % (ref 4.0–5.6)

## 2018-07-22 LAB — VITAMIN D 25 HYDROXY (VIT D DEFICIENCY, FRACTURES): VITD: 15.4 ng/mL — AB (ref 30.00–100.00)

## 2018-07-22 MED ORDER — VITAMIN D (ERGOCALCIFEROL) 1.25 MG (50000 UNIT) PO CAPS: 50000.0000 [IU] | ORAL_CAPSULE | ORAL | 0 refills | Status: DC

## 2018-07-22 MED ORDER — CITALOPRAM HYDROBROMIDE 40 MG PO TABS: 40.0000 mg | ORAL_TABLET | Freq: Every day | ORAL | 1 refills | Status: DC

## 2018-07-22 MED FILL — CITALOPRAM HBR 40 MG TABLET: 40 | 90 days supply | Qty: 90 | Fill #0

## 2018-07-22 MED FILL — VIT D2 1.25 MG (50,000 UNIT: 1.25 MG | 84 days supply | Qty: 12 | Fill #0

## 2018-07-22 NOTE — Patient Instructions (Addendum)
We will check your vit d and kidney function.  Please start the Vit D daily 2000 u with food.  If difficulty with swallowing worsens, please schedule an appt so we can address.    Dysphagia Dysphagia is trouble swallowing. This condition occurs when solids and liquids stick in a person's throat on the way down to the stomach, or when food takes longer to get to the stomach. You may have problems swallowing food, liquids, or both. You may also have pain while trying to swallow. It may take you more time and effort to swallow something. What are the causes? This condition is caused by:  Problems with the muscles. They may make it difficult for you to move food and liquids through the tube that connects your mouth to your stomach (esophagus). You may have ulcers, scar tissue, or inflammation that blocks the normal passage of food and liquids. Causes of these problems include: ? Acid reflux from your stomach into your esophagus (gastroesophageal reflux). ? Infections. ? Radiation treatment for cancer. ? Medicines taken without enough fluids to wash them down into your stomach.  Nerve problems. These prevent signals from being sent to the muscles of your esophagus to squeeze (contract) and move what you swallow down to your stomach.  Globus pharyngeus. This is a common problem that involves feeling like something is stuck in the throat or a sense of trouble with swallowing even though nothing is wrong with the swallowing passages.  Stroke. This can affect the nerves and make it difficult to swallow.  Certain conditions, such as cerebral palsy or Parkinson disease.  What are the signs or symptoms? Common symptoms of this condition include:  A feeling that solids or liquids are stuck in your throat on the way down to the stomach.  Food taking too long to get to the stomach.  Other symptoms include:  Food moving back from your stomach to your mouth (regurgitation).  Noises coming from your  throat.  Chest discomfort with swallowing.  A feeling of fullness when swallowing.  Drooling, especially when the throat is blocked.  Pain while swallowing.  Heartburn.  Coughing or gagging while trying to swallow.  How is this diagnosed? This condition is diagnosed by:  Barium X-ray. In this test, you swallow a white substance (contrast medium)that sticks to the inside of your esophagus. X-ray images are then taken.  Endoscopy. In this test, a flexible telescope is inserted down your throat to look at your esophagus and your stomach.  CT scans and MRI.  How is this treated? Treatment for dysphagia depends on the cause of the condition:  If the dysphagia is caused by acid reflux or infection, medicines may be used. They may include antibiotics and heartburn medicines.  If the dysphagia is caused by problems with your muscles, swallowing therapy may be used to help you strengthen your swallowing muscles. You may have to do specific exercises to strengthen the muscles or stretch them.  If the dysphagia is caused by a blockage or mass, procedures to remove the blockage may be done. You may need surgery and a feeding tube.  You may need to make diet changes. Ask your health care provider for specific instructions. Follow these instructions at home: Eating and drinking  Try to eat soft food that is easier to swallow.  Follow any diet changes as told by your health care provider.  Cut your food into small pieces and eat slowly.  Eat and drink only when you are sitting  upright.  Do not drink alcohol or caffeine. If you need help quitting, ask your health care provider. General instructions  Check your weight every day to make sure you are not losing weight.  Take over-the-counter and prescription medicines only as told by your health care provider.  If you were prescribed an antibiotic medicine, take it as told by your health care provider. Do not stop taking the  antibiotic even if you start to feel better.  Do not use any products that contain nicotine or tobacco, such as cigarettes and e-cigarettes. If you need help quitting, ask your health care provider.  Keep all follow-up visits as told by your health care provider. This is important. Contact a health care provider if:  You lose weight because you cannot swallow.  You cough when you drink liquids (aspiration).  You cough up partially digested food. Get help right away if:  You cannot swallow your saliva.  You have shortness of breath or a fever, or both.  You have a hoarse voice and also have trouble swallowing. Summary  Dysphagia is trouble swallowing. This condition occurs when solids and liquids stick in a person's throat on the way down to the stomach, or when food takes longer to get to the stomach.  Dysphagia has many possible causes and symptoms.  Treatment for dysphagia depends on the cause of the condition. This information is not intended to replace advice given to you by your health care provider. Make sure you discuss any questions you have with your health care provider. Document Released: 10/25/2000 Document Revised: 10/17/2016 Document Reviewed: 10/17/2016 Elsevier Interactive Patient Education  2017 [employer].

## 2018-07-22 NOTE — Telephone Encounter (Signed)
Her vit d is lower than 6 months ago at 46. I have called in Vit d once a week and she has to start taking a daily OTC of vit d 2000 u with meals.

## 2018-07-22 NOTE — Progress Notes (Signed)
Karlee Staff , [birth year], 57 y.o., female MRN: [id number] Patient Care Team    Relationship Specialty Notifications Start End  Natalia Leatherwood, DO PCP - General Family Medicine  01/14/17   Romualdo Bolk, MD Consulting Physician Obstetrics and Gynecology  01/14/18     Chief Complaint  Patient presents with  . Hypothyroidism  . Hot Flashes  . prediabetes     Subjective:Agness Faulk is a 57 y.o. female present for F/u  Lumbar pain: pt still complains of daily lumbar pain. She has had work up and MRI. She reports the diclofenac is very helpful. Still has occasional MSK complaints, but overall much better.   Hot flashes: pt has seen her gyn and told she is in perimenopause. She reports celexa 40 mg Qd was working really well, until she forgot 3 doses 3 weeks ago. She still feels they are improved from prior.   Elevated hemoglobin A1c 5.8 last collection. She has started to exercise some and watching her diet.   Vitamin D deficiency Very low Vit D 6 months ago.  Patient was prescribed high-dose weekly vitamin D which she completed.  She was encouraged to take 2000 units of vitamin D counter daily.  She states she has not been taking any over-the-counter vitamin D, she did complete the 3 months worth of weekly 50,000 units ergocalciferol.  Hypothyroidism, unspecified type Well controlled. Compliant with synthroid.   Dysphagia: new issue for her. She reports she choked on a chicken bone about 2 years ago. Since that time she has occasional episodes of food getting "stuck". The last few months she has had noticed x3 food has been difficult to swallow.   Depression screen [facility] 2/9 07/22/2018 01/14/2018 12/19/2017 12/09/2017 01/14/2017  Decreased Interest 0 0 0 0 0  Down, Depressed, Hopeless 0 0 0 0 0  PHQ - 2 Score 0 0 0 0 0    No Known Allergies Social History   Tobacco Use  . Smoking status: Never Smoker  . Smokeless tobacco: Never Used  Substance Use Topics  . Alcohol use:  Yes    Alcohol/week: 1.0 standard drinks    Types: 1 Glasses of wine per week   Past Medical History:  Diagnosis Date  . Bilateral edema of lower extremity    tried on low dose lasix by prior provider  . Chicken pox   . Hemorrhoid   . HSV infection   . Hypothyroidism    Past Surgical History:  Procedure Laterality Date  . APPENDECTOMY  1973  . BREAST LUMPECTOMY Right 2014  . HEMORRHOID SURGERY    . TUBAL LIGATION  2004   Family History  Problem Relation Age of Onset  . Diabetes Mother   . Valvular heart disease Father   . Breast cancer Maternal Aunt        50   Allergies as of 07/22/2018   No Known Allergies     Medication List        Accurate as of 07/22/18  8:50 AM. Always use your most recent med list.          Boric Acid Powd Apply one suppository vaginally at bedtime before menses or after   citalopram 40 MG tablet Commonly known as:  CELEXA Take 1 tablet (40 mg total) by mouth daily.   Diclofenac Sodium CR 100 MG 24 hr tablet Take 1 tablet (100 mg total) by mouth daily.   levothyroxine 100 MCG tablet Commonly known as:  SYNTHROID, LEVOTHROID Take  1 tablet (100 mcg total) by mouth daily before breakfast. Brand Name only   valACYclovir 500 MG tablet Commonly known as:  VALTREX For oral hsv, take 4 tablets BID for one day. For genital hsv take one tablet BID for 3 days       All past medical history, surgical history, allergies, family history, immunizations andmedications were updated in the EMR today and reviewed under the history and medication portions of their EMR.     ROS: Negative, with the exception of above mentioned in HPI   Objective:  BP 109/71 (BP Location: Right Arm, Patient Position: Sitting, Cuff Size: Large)   Pulse 65   Temp 97.9 F (36.6 C)   Resp 20   Ht 5\' 5"  (1.651 m)   Wt 175 lb (79.4 kg)   SpO2 97%   BMI 29.12 kg/m  Body mass index is 29.12 kg/m. Gen: Afebrile. No acute distress. Nontoxic in appearance, well  developed, well nourished.  HENT: AT. [place]. MMM, no oral lesions.  Eyes:Pupils Equal Round Reactive to light, Extraocular movements intact,  Conjunctiva without redness, discharge or icterus. Neck/lymp/endocrine: Supple, no lymphadenopathy, thyromegaly CV: RRR no murmur, no edema Chest: CTAB, no wheeze or crackles. Good air movement, normal resp effort.  Abd: Soft. NTND. BS present Skin: no rashes, purpura or petechiae.  Neuro:  Normal gait. PERLA. EOMi. Alert. Oriented x3  Psych: Normal affect, dress and demeanor. Normal speech. Normal thought content and judgment.  No exam data present No results found. Results for orders placed or performed in visit on 07/22/18 (from the past 24 hour(s))  POCT glycosylated hemoglobin (Hb A1C)     Status: Normal   Collection Time: 07/22/18  8:45 AM  Result Value Ref Range   Hemoglobin A1C 5.4 4.0 - 5.6 %   HbA1c POC (<> result, manual entry)     HbA1c, POC (prediabetic range)     HbA1c, POC (controlled diabetic range)      Assessment/Plan: Laterika Boesch is a 57 y.o. female present for OV for  Vitamin D deficiency - rpt again today.  - strongly encouraged her to take a daily supplement of 2000 u.  Supplement again if needed.  Prediabetes:  - 5.8--> 5.4 today. Improved with diet and exercise.  Lumbar pain Continue diclofenac QD prescribed BMP to check after initiation of med. Will follow yearly with CPE  Hot flashes: - Continue celexa 40 mg QD.  - f/u 6 months sooner if needed.   Hypothyroid:  -Well-controlled.  No refills needed continue levothyroxine at current dose.  Dysphagia:  -New issue for her.  Discussed options and potential differential diagnosis.  Possible stricture formation after injury is suspected.  Offered referral to gastroenterology for further evaluation and/or a barium swallow test and she would like to wait for now.  If she decides she needs treatment she will make a follow-up appointment for  discussion.    Reviewed expectations re: course of current medical issues.  Discussed self-management of symptoms.  Outlined signs and symptoms indicating need for more acute intervention.  Patient verbalized understanding and all questions were answered.  Patient received an After-Visit Summary.    Orders Placed This Encounter  Procedures  . Vitamin D (25 hydroxy)  . POCT glycosylated hemoglobin (Hb A1C)   > 25 minutes spent with patient, >50% of time spent face to face counseling    Note is dictated utilizing voice recognition software. Although note has been proof read prior to signing, occasional typographical errors still can  be missed. If any questions arise, please do not hesitate to call for verification.   electronically signed by:  Howard Pouch, DO  [place]

## 2018-07-23 ENCOUNTER — Encounter: Payer: Self-pay | Admitting: Family Medicine

## 2018-07-23 NOTE — Telephone Encounter (Signed)
Detailed message left on voice mail. Okay per DPR. 

## 2018-08-03 ENCOUNTER — Encounter: Payer: Self-pay | Admitting: Family Medicine

## 2018-08-04 ENCOUNTER — Encounter: Payer: Self-pay | Admitting: *Deleted

## 2018-08-05 ENCOUNTER — Encounter: Payer: Self-pay | Admitting: *Deleted

## 2018-08-05 ENCOUNTER — Telehealth: Payer: Self-pay | Admitting: Family Medicine

## 2018-08-05 MED ORDER — LORATADINE-PSEUDOEPHEDRINE ER 10-240 MG PO TB24: 1.0000 | ORAL_TABLET | Freq: Every day | ORAL | 0 refills | Status: DC

## 2018-08-05 NOTE — Telephone Encounter (Signed)
I have called in claritin -D as requested by e-chart.  This medication contains pseudoephedrine and should only be taking with acute symptoms and not long term.  Regular formulations without the "D" such a plain Claritin, zyrtec or xyzal would be encouraged for daily maintenance use.

## 2018-08-18 MED FILL — SM LORATA-DINE D 24HR TABLE: 10-240 | 30 days supply | Qty: 30 | Fill #0

## 2018-08-27 MED FILL — SYNTHROID 100 MCG TABLET: 100 | 90 days supply | Qty: 90 | Fill #2

## 2018-09-06 ENCOUNTER — Encounter: Payer: Self-pay | Admitting: Family Medicine

## 2018-09-07 ENCOUNTER — Telehealth: Payer: Self-pay | Admitting: Family Medicine

## 2018-09-07 NOTE — Telephone Encounter (Signed)
In response to pt echart message on weaning off celexa. She can start by taking a half tab daily.  Effexor may be a nice switch to help with hot flashes and we could switch her after she tapers to the celexa 20 mg for 1-2 weeks. Advise pt make an appt to discuss start of new medication.

## 2018-09-08 NOTE — Telephone Encounter (Signed)
Left detailed message and instructions on patient voice mail per DPR. 

## 2018-09-15 ENCOUNTER — Encounter: Payer: Self-pay | Admitting: Family Medicine

## 2018-10-16 ENCOUNTER — Other Ambulatory Visit: Payer: Self-pay | Admitting: Family Medicine

## 2018-10-16 MED FILL — DICLOFENAC SOD ER 100 MG TA: 100 | 90 days supply | Qty: 90 | Fill #3

## 2018-10-16 MED FILL — CITALOPRAM HBR 40 MG TABLET: 40 | 90 days supply | Qty: 90 | Fill #1

## 2018-10-16 NOTE — Telephone Encounter (Signed)
Patient informed of information.  Stated verbal understanding  

## 2018-10-16 NOTE — Telephone Encounter (Signed)
Please inform patient the following information: Received refill request for 2 meds.  I am willing to refill her Claritin -D, although it is important for her to understand this is for Temporary relief of sinus problems, not routine use. Xyzal before bed is better daily option. Her high dose vitamin D does not need refilled. She was to start OTC vit d 2000u daily now that her high dose is done- she just continues the OTC

## 2018-10-22 ENCOUNTER — Ambulatory Visit: Payer: No Typology Code available for payment source | Admitting: Family Medicine

## 2018-10-27 ENCOUNTER — Ambulatory Visit (INDEPENDENT_AMBULATORY_CARE_PROVIDER_SITE_OTHER): Payer: No Typology Code available for payment source | Admitting: Family Medicine

## 2018-10-27 ENCOUNTER — Encounter: Payer: Self-pay | Admitting: Family Medicine

## 2018-10-27 VITALS — BP 103/71 | HR 69 | Temp 98.3°F | Resp 16 | Ht 64.5 in | Wt 178.0 lb

## 2018-10-27 DIAGNOSIS — N951 Menopausal and female climacteric states: Secondary | ICD-10-CM | POA: Diagnosis not present

## 2018-10-27 DIAGNOSIS — M25512 Pain in left shoulder: Secondary | ICD-10-CM | POA: Diagnosis not present

## 2018-10-27 MED ORDER — VENLAFAXINE HCL ER 37.5 MG PO CP24: ORAL_CAPSULE | ORAL | 0 refills | Status: DC

## 2018-10-27 MED FILL — VENLAFAXINE HCL ER 37.5 MG: 37.5 | 28 days supply | Qty: 49 | Fill #0

## 2018-10-27 NOTE — Progress Notes (Signed)
Molly Park , 1961-05-14, 57 y.o., female MRN: [id number] Patient Care Team    Relationship Specialty Notifications Start End  Natalia Leatherwood, DO PCP - General Family Medicine  01/14/17   Romualdo Bolk, MD Consulting Physician Obstetrics and Gynecology  01/14/18     Chief Complaint  Patient presents with  . Follow-up    Discuss switching Celexa, having hot flashes      Subjective:Molly Park is a 57 y.o. female present for   Hot flashes:  She presents today to talk about her hot flashes.  She reports that the Celexa was working rather well until recently.  We have even increased her dose, which did not seem very effective for her.  She would like to try something else today. Prior note: pt has seen her gyn and told she is in perimenopause. She reports celexa 40 mg Qd was working really well, until she forgot 3 doses 3 weeks ago. She still feels they are improved from prior.   Shoulder pain:  And also endorses a new problem of left arm pain.  She states it has been present for approximately 10 days.  She reports her shoulder and arm is sore to touch.  She states it hurts to lay on her arm.  When extending her arm it is uncomfortable.  She does not recall any increase in activity or injury.  She did put up Christmas decorations around that time.  She feels her left arm is tingly if she lays on it.  Depression screen [facility] 2/9 10/27/2018 07/22/2018 01/14/2018  Decreased Interest 0 0 0  Down, Depressed, Hopeless 0 0 0  PHQ - 2 Score 0 0 0  Altered sleeping 1 - -  Tired, decreased energy 3 - -  Change in appetite 0 - -  Feeling bad or failure about yourself  0 - -  Trouble concentrating 0 - -  Moving slowly or fidgety/restless 1 - -  Suicidal thoughts 0 - -  PHQ-9 Score 5 - -  Difficult doing work/chores Not difficult at all - -   GAD 7 : Generalized Anxiety Score 10/27/2018  Nervous, Anxious, on Edge 1  Control/stop worrying 1  Worry too much - different things 1    Trouble relaxing 1  Restless 1  Easily annoyed or irritable 0  Afraid - awful might happen 0  Total GAD 7 Score 5  Anxiety Difficulty Not difficult at all    No Known Allergies Social History   Tobacco Use  . Smoking status: Never Smoker  . Smokeless tobacco: Never Used  Substance Use Topics  . Alcohol use: Yes    Alcohol/week: 1.0 standard drinks    Types: 1 Glasses of wine per week   Past Medical History:  Diagnosis Date  . Bilateral edema of lower extremity    tried on low dose lasix by prior provider  . Chicken pox   . Hemorrhoid   . HSV infection   . Hypothyroidism    Past Surgical History:  Procedure Laterality Date  . APPENDECTOMY  1973  . BREAST LUMPECTOMY Right 2014  . HEMORRHOID SURGERY    . TUBAL LIGATION  2004   Family History  Problem Relation Age of Onset  . Diabetes Mother   . Valvular heart disease Father   . Breast cancer Maternal Aunt        50   Allergies as of 10/27/2018   No Known Allergies     Medication List  Accurate as of October 27, 2018  4:03 PM. Always use your most recent med list.        Boric Acid Powd Apply one suppository vaginally at bedtime before menses or after   citalopram 40 MG tablet Commonly known as:  CELEXA Take 1 tablet (40 mg total) by mouth daily.   Diclofenac Sodium CR 100 MG 24 hr tablet Take 1 tablet (100 mg total) by mouth daily.   levothyroxine 100 MCG tablet Commonly known as:  SYNTHROID Take 1 tablet (100 mcg total) by mouth daily before breakfast. Brand Name only   SM LORATA-DINE D 10-240 MG 24 hr tablet Generic drug:  loratadine-pseudoephedrine TAKE 1 TABLET BY MOUTH DAILY.   valACYclovir 500 MG tablet Commonly known as:  VALTREX For oral hsv, take 4 tablets BID for one day. For genital hsv take one tablet BID for 3 days   Vitamin D 50 MCG (2000 UT) tablet Take 2,000 Units by mouth daily.       All past medical history, surgical history, allergies, family history,  immunizations andmedications were updated in the EMR today and reviewed under the history and medication portions of their EMR.     ROS: Negative, with the exception of above mentioned in HPI   Objective:  BP 103/71 (BP Location: Right Arm, Patient Position: Sitting, Cuff Size: Normal)   Pulse 69   Temp 98.3 F (36.8 C) (Oral)   Resp 16   Ht 5' 4.5" (1.638 m)   Wt 178 lb (80.7 kg)   SpO2 97%   BMI 30.08 kg/m  Body mass index is 30.08 kg/m. Gen: Afebrile. No acute distress.  Nontoxic.  Pleasant.  Caucasian female. HENT: AT. [place].  MMM.  Eyes:Pupils Equal Round Reactive to light, Extraocular movements intact,  Conjunctiva without redness, discharge or icterus. MSK (extremity, left): No erythema, no soft tissue swelling, tender to palpation right bicep groove.  Full range of motion in abduction.  Negative Hawkins, negative liftoff, negative empty can test.  Neurovascularly intact distally. Skin: no rashes, purpura or petechiae.  Neuro: Normal gait. PERLA. EOMi. Alert. Oriented x3  Psych: Normal affect, dress and demeanor. Normal speech. Normal thought content and judgment.   No exam data present No results found. No results found for this or any previous visit (from the past 24 hour(s)).  Assessment/Plan: Molly Park is a 57 y.o. female present for OV for  Hot flashes: -Was presented with different options of medications, and it was agreed to start the Effexor taper.  Patient was instructed by verbal and written communication in her AVS on proper tapering Effexor. -Patient is aware she cannot stop this medication abruptly, and if she decides she would like to stop medication she will need to taper off medication. -Follow-up 3.5 weeks  Acute pain of left shoulder She is  tender over proximal bicep tendon.  Advised her to continue the diclofenac, avoid heavy lifting or stretching arms above her head.  Did encourage her to maintain normal range of motion with her arm. -If symptoms  are worsening she was encouraged to follow-up immediately.  If symptoms are not resolved in 2 weeks with rest, ice and NSAIDs follow-up and will consider imaging or referral to orthopedics.   Reviewed expectations re: course of current medical issues.  Discussed self-management of symptoms.  Outlined signs and symptoms indicating need for more acute intervention.  Patient verbalized understanding and all questions were answered.  Patient received an After-Visit Summary.    No orders of the  defined types were placed in this encounter.  > 25 minutes spent with patient, >50% of time spent face to face counseling    Note is dictated utilizing voice recognition software. Although note has been proof read prior to signing, occasional typographical errors still can be missed. If any questions arise, please do not hesitate to call for verification.   electronically signed by:  Felix Pacinienee Kuneff, DO  [facility] [place]

## 2018-10-27 NOTE — Patient Instructions (Addendum)
Stop celexa, start effexor taper next day. Effexor 1 pill for 7 days, then 2 pills at same time daily.   F/U 3.5 weeks   Biceps Tendon Tendinitis (Proximal) and Tenosynovitis The proximal biceps tendon is a strong cord of tissue that connects the biceps muscle, on the front of the upper arm, to the shoulder blade. Tendinitis is inflammation of a tendon. Tenosynovitis is inflammation of the lining around the tendon (tendon sheath). These conditions often occur at the same time, and they can interfere with the ability to bend the elbow and turn the hand palm-up (supination). Proximal biceps tendon tendinitis and tenosynovitis are usually caused by overusing the shoulder joint and the biceps muscle. These conditions usually heal within 6 weeks. Proximal biceps tendon tendinitis may include a grade 1 or grade 2 strain of the tendon. A grade 1 strain is mild, and it involves a slight pull of the tendon without any stretching or noticeable tearing of the tendon. There is usually no loss of biceps muscle strength. A grade 2 strain is moderate, and it involves a small tear in the tendon. The tendon is stretched, and biceps strength is usually decreased. What are the causes? This condition may be caused by:  A sudden increase in frequency or intensity of activity that involves the shoulder and the biceps muscle.  Overuse of the biceps muscle. This can happen when you do the same movements over and over, such as: ? Supination. ? Forceful straightening (hyperextension) of the elbow. ? Bending the elbow.  A direct, forceful hit or injury (trauma) to the elbow. This is rare.  What increases the risk? The following factors may make you more likely to develop this condition:  Playing contact sports.  Playing sports that involve throwing and overhead movements, including racket sports, gymnastics, weight lifting, or bodybuilding.  Doing physical labor.  Having poor strength and flexibility of the arm  and shoulder.  What are the signs or symptoms? Symptoms of this condition may include:  Pain and inflammation in the front of the shoulder. Pain may get worse with movement, especially when you use resistance, as in weight lifting.  A feeling of warmth in the front of the shoulder.  Limited range of motion of the shoulder and the elbow.  A crackling sound (crepitation) when you move or touch the shoulder or the upper arm.  In some cases, symptoms may return (recur) after treatment, and they may be long-lasting (chronic). How is this diagnosed? This condition is diagnosed based on your symptoms, your medical history, and a physical exam. You may have tests, including X-rays or MRIs. Your health care provider may test your range of motion by asking you to do arm movements. How is this treated? This condition is treated by resting and icing the injured area, and by doing physical therapy exercises. Depending on the severity of your condition, treatment may also include:  Medicines to help relieve pain and inflammation.  Ultrasound therapy. This is the application of sound waves to the injured area.  Injecting medicines (corticosteroids) into your tendon sheath.  Injecting medicines that numb the area (local anesthetics).  Surgery to remove the damaged part of the tendon and reattach the undamaged part of the tendon to the arm bone (humerus). This is usually only done if you have symptoms that do not get better with other treatment methods.  Follow these instructions at home: Managing pain, stiffness, and swelling  If directed, put ice on the injured area: ? Put ice  in a plastic bag. ? Place a towel between your skin and the bag. ? Leave the ice on for 20 minutes, 2-3 times a day.  Move your fingers often to avoid stiffness and to lessen swelling.  Raise (elevate) the injured area above the level of your heart while you are sitting or lying down.  If directed, apply heat to the  affected area before you exercise. Use the heat source that your health care provider recommends, such as a moist heat pack or a heating pad. ? Place a towel between your skin and the heat source. ? Leave the heat on for 20-30 minutes. ? Remove the heat if your skin turns bright red. This is especially important if you are unable to feel pain, heat, or cold. You may have a greater risk of getting burned. Activity  Return to your normal activities as told by your health care provider. Ask your health care provider what activities are safe for you.  Do not lift anything that is heavier than 10 lb (4.5 kg) until your health care provider tells you that it is safe.  Avoid activities that cause pain or make your condition worse.  Do exercises as told by your health care provider. General instructions  Take over-the-counter and prescription medicines only as told by your health care provider.  Do not drive or operate heavy machinery while taking prescription pain medicines.  Keep all follow-up visits as told by your health care provider. This is important. How is this prevented?  Warm up and stretch before being active.  Cool down and stretch after being active.  Give your body time to rest between periods of activity.  Make sure any equipment that you use is fitted to you.  Be safe and responsible while being active to avoid falls.  Do at least 150 minutes of moderate-intensity aerobic exercise each week, such as brisk walking or water aerobics.  Maintain physical fitness, including: ? Strength. ? Flexibility. ? Cardiovascular fitness. ? Endurance. Contact a health care provider if:  You have symptoms that get worse or do not get better after 2 weeks of treatment.  You develop new symptoms. Get help right away if:  You develop severe pain. This information is not intended to replace advice given to you by your health care provider. Make sure you discuss any questions you  have with your health care provider. Document Released: 10/28/2005 Document Revised: 07/04/2016 Document Reviewed: 10/06/2015 Elsevier Interactive Patient Education  [employer].   Please help [place] help you:  We are honored you have chosen [facility] for your Primary Care home. Below you will find basic instructions that you may need to access in the future. Please help [place] help you by reading the instructions, which cover many of the frequent questions we experience.   Prescription refills and request:  -In order to allow more efficient response time, please call your pharmacy for all refills. They will forward the request electronically to [place]. This allows for the quickest possible response. Request left on a nurse line can take longer to refill, since these are checked as time allows between office patients and other phone calls.  - refill request can take up to 3-5 working days to complete.  - If request is sent electronically and request is appropiate, it is usually completed in 1-2 business days.  - all patients will need to be seen routinely for all chronic medical conditions requiring prescription medications (see follow-up below). If you  are overdue for follow up on your condition, you will be asked to make an appointment and we will call in enough medication to cover you until your appointment (up to 30 days).  - all controlled substances will require a face to face visit to request/refill.  - if you desire your prescriptions to go through a new pharmacy, and have an active script at original pharmacy, you will need to call your pharmacy and have scripts transferred to new pharmacy. This is completed between the pharmacy locations and not by your provider.    Results: If any images or labs were ordered, it can take up to 1 week to get results depending on the test ordered and the lab/facility running and resulting the test. - Normal or stable results, which do not need further  discussion, may be released to your mychart immediately with attached note to you. A call may not be generated for normal results. Please make certain to sign up for mychart. If you have questions on how to activate your mychart you can call the front office.  - If your results need further discussion, our office will attempt to contact you via phone, and if unable to reach you after 2 attempts, we will release your abnormal result to your mychart with instructions.  - All results will be automatically released in mychart after 1 week.  - Your provider will provide you with explanation and instruction on all relevant material in your results. Please keep in mind, results and labs may appear confusing or abnormal to the untrained eye, but it does not mean they are actually abnormal for you personally. If you have any questions about your results that are not covered, or you desire more detailed explanation than what was provided, you should make an appointment with your provider to do so.   Our office handles many outgoing and incoming calls daily. If we have not contacted you within 1 week about your results, please check your mychart to see if there is a message first and if not, then contact our office.  In helping with this matter, you help decrease call volume, and therefore allow us to be able to respond to patients needs more efficiently.   Acute office visits (sick visit):  An acute visit is intended for a new problem and are scheduled in shorter time slots to allow schedule openings for patients with new problems. This is the appropriate visit to discuss a new problem. Problems will not be addressed by phone call or Echart message. Appointment is needed if requesting treatment. In order to provide you with excellent quality medical care with proper time for you to explain your problem, have an exam and receive treatment with instructions, these appointments should be limited to one new problem per  visit. If you experience a new problem, in which you desire to be addressed, please make an acute office visit, we save openings on the schedule to accommodate you. Please do not save your new problem for any other type of visit, let us take care of it properly and quickly for you.   Follow up visits:  Depending on your condition(s) your provider will need to see you routinely in order to provide you with quality care and prescribe medication(s). Most chronic conditions (Example: hypertension, Diabetes, depression/anxiety... etc), require visits a couple times a year. Your provider will instruct you on proper follow up for your personal medical conditions and history. Please make certain to make follow up appointments  for your condition as instructed. Failing to do so could result in lapse in your medication treatment/refills. If you request a refill, and are overdue to be seen on a condition, we will always provide you with a 30 day script (once) to allow you time to schedule.    Medicare wellness (well visit): - we have a wonderful Nurse Selena Batten), that will meet with you and provide you will yearly medicare wellness visits. These visits should occur yearly (can not be scheduled less than 1 calendar year apart) and cover preventive health, immunizations, advance directives and screenings you are entitled to yearly through your medicare benefits. Do not miss out on your entitled benefits, this is when medicare will pay for these benefits to be ordered for you.  These are strongly encouraged by your provider and is the appropriate type of visit to make certain you are up to date with all preventive health benefits. If you have not had your medicare wellness exam in the last 12 months, please make certain to schedule one by calling the office and schedule your medicare wellness with Selena Batten as soon as possible.   Yearly physical (well visit):  - Adults are recommended to be seen yearly for physicals. Check with  your insurance and date of your last physical, most insurances require one calendar year between physicals. Physicals include all preventive health topics, screenings, medical exam and labs that are appropriate for gender/age and history. You may have fasting labs needed at this visit. This is a well visit (not a sick visit), new problems should not be covered during this visit (see acute visit).  - Pediatric patients are seen more frequently when they are younger. Your provider will advise you on well child visit timing that is appropriate for your their age. - This is not a medicare wellness visit. Medicare wellness exams do not have an exam portion to the visit. Some medicare companies allow for a physical, some do not allow a yearly physical. If your medicare allows a yearly physical you can schedule the medicare wellness with our nurse Selena Batten and have your physical with your provider after, on the same day. Please check with insurance for your full benefits.   Late Policy/No Shows:  - all new patients should arrive 15-30 minutes earlier than appointment to allow [place] time  to  obtain all personal demographics,  insurance information and for you to complete office paperwork. - All established patients should arrive 10-15 minutes earlier than appointment time to update all information and be checked in .  - In our best efforts to run on time, if you are late for your appointment you will be asked to either reschedule or if able, we will work you back into the schedule. There will be a wait time to work you back in the schedule,  depending on availability.  - If you are unable to make it to your appointment as scheduled, please call 24 hours ahead of time to allow [place] to fill the time slot with someone else who needs to be seen. If you do not cancel your appointment ahead of time, you may be charged a no show fee.

## 2018-10-28 MED FILL — SM LORATA-DINE D 24HR TABLE: 10-240 | 30 days supply | Qty: 30 | Fill #0

## 2018-11-06 MED FILL — VALACYCLOVIR HCL 500 MG TAB: 500 | 30 days supply | Qty: 30 | Fill #1

## 2018-11-19 ENCOUNTER — Encounter: Payer: Self-pay | Admitting: Family Medicine

## 2018-11-19 ENCOUNTER — Ambulatory Visit (INDEPENDENT_AMBULATORY_CARE_PROVIDER_SITE_OTHER): Payer: No Typology Code available for payment source | Admitting: Family Medicine

## 2018-11-19 VITALS — BP 109/72 | HR 98 | Temp 98.4°F | Resp 16 | Ht 64.5 in | Wt 178.0 lb

## 2018-11-19 DIAGNOSIS — R232 Flushing: Secondary | ICD-10-CM | POA: Diagnosis not present

## 2018-11-19 MED ORDER — VENLAFAXINE HCL ER 75 MG PO CP24: 75.0000 mg | ORAL_CAPSULE | Freq: Every day | ORAL | 1 refills | Status: DC

## 2018-11-19 MED FILL — VENLAFAXINE HCL ER 75 MG CA: 75 | 90 days supply | Qty: 90 | Fill #0

## 2018-11-19 NOTE — Progress Notes (Signed)
Molly KinsmanJeannette Park , Apr 25, 1961, 58 y.o., female MRN: [id number] Patient Care Team    Relationship Specialty Notifications Start End  Natalia LeatherwoodKuneff, Zeynep Fantroy A, DO PCP - General Family Medicine  01/14/17   Romualdo BolkJertson, Jill Evelyn, MD Consulting Physician Obstetrics and Gynecology  01/14/18     Chief Complaint  Patient presents with  . Follow-up    Hot Flashes, getting alittle better but still occuring     Subjective:Molly Park is a 58 y.o. female present for   Hot flashes:  Doing well, she is seeing improvement on new med- Effexor already- tapered to 75 mg 2.5 weeks ago. No side effects.  Prior note:  She presents today to talk about her hot flashes.  She reports that the Celexa was working rather well until recently.  We have even increased her dose, which did not seem very effective for her.  She would like to try something else today. Prior note: pt has seen her gyn and told she is in perimenopause. She reports celexa 40 mg Qd was working really well, until she forgot 3 doses 3 weeks ago. She still feels they are improved from prior.    Dep[facility] 10/27/2018 07/22/2018 01/14/2018  Decreased Interest 0 0 0  Down, Depressed, Hopeless 0 0 0  PHQ - 2 Score 0 0 0  Altered sleeping 1 - -  Tired, decreased energy 3 - -  Change in appetite 0 - -  Feeling bad or failure about yourself  0 - -  Trouble concentrating 0 - -  Moving slowly or fidgety/restless 1 - -  Suicidal thoughts 0 - -  PHQ-9 Score 5 - -  Difficult doing work/chores Not difficult at all - -   GAD 7 : Generalized Anxiety Score 10/27/2018  Nervous, Anxious, on Edge 1  Control/stop worrying 1  Worry too much - different things 1  Trouble relaxing 1  Restless 1  Easily annoyed or irritable 0  Afraid - awful might happen 0  Total GAD 7 Score 5  Anxiety Difficulty Not difficult at all    No Known Allergies Social History   Tobacco Use  . Smoking status: Never Smoker  . Smokeless tobacco: Never Used    Substance Use Topics  . Alcohol use: Yes    Alcohol/week: 1.0 standard drinks    Types: 1 Glasses of wine per week   Past Medical History:  Diagnosis Date  . Bilateral edema of lower extremity    tried on low dose lasix by prior provider  . Chicken pox   . Hemorrhoid   . HSV infection   . Hypothyroidism    Past Surgical History:  Procedure Laterality Date  . APPENDECTOMY  1973  . BREAST LUMPECTOMY Right 2014  . HEMORRHOID SURGERY    . TUBAL LIGATION  2004   Family History  Problem Relation Age of Onset  . Diabetes Mother   . Valvular heart disease Father   . Breast cancer Maternal Aunt        50   Allergies as of 11/19/2018   No Known Allergies     Medication List       Accurate as of November 19, 2018  8:44 AM. Always use your most recent med list.        Boric Acid Powd Apply one suppository vaginally at bedtime before menses or after   Diclofenac Sodium CR 100 MG 24 hr tablet Take 1 tablet (100 mg total) by mouth daily.  levothyroxine 100 MCG tablet Commonly known as:  SYNTHROID Take 1 tablet (100 mcg total) by mouth daily before breakfast. Brand Name only   valACYclovir 500 MG tablet Commonly known as:  VALTREX For oral hsv, take 4 tablets BID for one day. For genital hsv take one tablet BID for 3 days   venlafaxine XR 37.5 MG 24 hr capsule Commonly known as:  EFFEXOR-XR 37.5 mg Qd for 7 days, then 75 mg QD   Vitamin D 50 MCG (2000 UT) tablet Take 2,000 Units by mouth daily.       All past medical history, surgical history, allergies, family history, immunizations andmedications were updated in the EMR today and reviewed under the history and medication portions of their EMR.     ROS: Negative, with the exception of above mentioned in HPI  Objective:  BP 109/72 (BP Location: Right Arm, Patient Position: Sitting, Cuff Size: Normal)   Pulse 98   Temp 98.4 F (36.9 C) (Oral)   Resp 16   Ht 5' 4.5" (1.638 m)   Wt 178 lb (80.7 kg)   LMP  08/11/2018 (Within Months)   SpO2 97%   BMI 30.08 kg/m  Body mass index is 30.08 kg/m. Gen: Afebrile. No acute distress.  HENT: AT. [place].  MMM.  Eyes:Pupils Equal Round Reactive to light, Extraocular movements intact,  Conjunctiva without redness, discharge or icterus. CV: RRR Psych: Normal affect, dress and demeanor. Normal speech. Normal thought content and judgment.   No exam data present No results found. No results found for this or any previous visit (from the past 24 hour(s)).  Assessment/Plan: Molly KinsmanJeannette Debrosse is a 58 y.o. female present for OV for  Hot flashes: - improving on effexor. Continue 75 mg QD. Refills provided today.  - Will touch base on her CPE in March and then can refill yearly since it is for hot flashes (not mental health)    Reviewed expectations re: course of current medical issues.  Discussed self-management of symptoms.  Outlined signs and symptoms indicating need for more acute intervention.  Patient verbalized understanding and all questions were answered.  Patient received an After-Visit Summary.    No orders of the defined types were placed in this encounter.   Note is dictated utilizing voice recognition software. Although note has been proof read prior to signing, occasional typographical errors still can be missed. If any questions arise, please do not hesitate to call for verification.   electronically signed by:  Felix Pacinienee Dishon Kehoe, DO  [facility] [place]

## 2018-11-19 NOTE — Patient Instructions (Signed)
Continue the effexor at 75 mg a day--> now 1 pill a day (pill is 75 mg)  Follow up in 6 months unless needed sooner.   Please help [place] help you:  We are honored you have chosen [facility] for your Primary Care home. Below you will find basic instructions that you may need to access in the future. Please help [place] help you by reading the instructions, which cover many of the frequent questions we experience.   Prescription refills and request:  -In order to allow more efficient response time, please call your pharmacy for all refills. They will forward the request electronically to [place]. This allows for the quickest possible response. Request left on a nurse line can take longer to refill, since these are checked as time allows between office patients and other phone calls.  - refill request can take up to 3-5 working days to complete.  - If request is sent electronically and request is appropiate, it is usually completed in 1-2 business days.  - all patients will need to be seen routinely for all chronic medical conditions requiring prescription medications (see follow-up below). If you are overdue for follow up on your condition, you will be asked to make an appointment and we will call in enough medication to cover you until your appointment (up to 30 days).  - all controlled substances will require a face to face visit to request/refill.  - if you desire your prescriptions to go through a new pharmacy, and have an active script at original pharmacy, you will need to call your pharmacy and have scripts transferred to new pharmacy. This is completed between the pharmacy locations and not by your provider.    Results: If any images or labs were ordered, it can take up to 1 week to get results depending on the test ordered and the lab/facility running and resulting the test. - Normal or stable results, which do not need further discussion, may be released to your mychart immediately with attached  note to you. A call may not be generated for normal results. Please make certain to sign up for mychart. If you have questions on how to activate your mychart you can call the front office.  - If your results need further discussion, our office will attempt to contact you via phone, and if unable to reach you after 2 attempts, we will release your abnormal result to your mychart with instructions.  - All results will be automatically released in mychart after 1 week.  - Your provider will provide you with explanation and instruction on all relevant material in your results. Please keep in mind, results and labs may appear confusing or abnormal to the untrained eye, but it does not mean they are actually abnormal for you personally. If you have any questions about your results that are not covered, or you desire more detailed explanation than what was provided, you should make an appointment with your provider to do so.   Our office handles many outgoing and incoming calls daily. If we have not contacted you within 1 week about your results, please check your mychart to see if there is a message first and if not, then contact our office.  In helping with this matter, you help decrease call volume, and therefore allow [place] to be able to respond to patients needs more efficiently.   Acute office visits (sick visit):  An acute visit is intended for a new problem and are scheduled in  shorter time slots to allow schedule openings for patients with new problems. This is the appropriate visit to discuss a new problem. Problems will not be addressed by phone call or Echart message. Appointment is needed if requesting treatment. In order to provide you with excellent quality medical care with proper time for you to explain your problem, have an exam and receive treatment with instructions, these appointments should be limited to one new problem per visit. If you experience a new problem, in which you desire to be  addressed, please make an acute office visit, we save openings on the schedule to accommodate you. Please do not save your new problem for any other type of visit, let us take care of it properly and quickly for you.   Follow up visits:  Depending on your condition(s) your provider will need to see you routinely in order to provide you with quality care and prescribe medication(s). Most chronic conditions (Example: hypertension, Diabetes, depression/anxiety... etc), require visits a couple times a year. Your provider will instruct you on proper follow up for your personal medical conditions and history. Please make certain to make follow up appointments for your condition as instructed. Failing to do so could result in lapse in your medication treatment/refills. If you request a refill, and are overdue to be seen on a condition, we will always provide you with a 30 day script (once) to allow you time to schedule.    Medicare wellness (well visit): - we have a wonderful Nurse Selena Batten), that will meet with you and provide you will yearly medicare wellness visits. These visits should occur yearly (can not be scheduled less than 1 calendar year apart) and cover preventive health, immunizations, advance directives and screenings you are entitled to yearly through your medicare benefits. Do not miss out on your entitled benefits, this is when medicare will pay for these benefits to be ordered for you.  These are strongly encouraged by your provider and is the appropriate type of visit to make certain you are up to date with all preventive health benefits. If you have not had your medicare wellness exam in the last 12 months, please make certain to schedule one by calling the office and schedule your medicare wellness with Selena Batten as soon as possible.   Yearly physical (well visit):  - Adults are recommended to be seen yearly for physicals. Check with your insurance and date of your last physical, most insurances  require one calendar year between physicals. Physicals include all preventive health topics, screenings, medical exam and labs that are appropriate for gender/age and history. You may have fasting labs needed at this visit. This is a well visit (not a sick visit), new problems should not be covered during this visit (see acute visit).  - Pediatric patients are seen more frequently when they are younger. Your provider will advise you on well child visit timing that is appropriate for your their age. - This is not a medicare wellness visit. Medicare wellness exams do not have an exam portion to the visit. Some medicare companies allow for a physical, some do not allow a yearly physical. If your medicare allows a yearly physical you can schedule the medicare wellness with our nurse Selena Batten and have your physical with your provider after, on the same day. Please check with insurance for your full benefits.   Late Policy/No Shows:  - all new patients should arrive 15-30 minutes earlier than appointment to allow [place] time  to  obtain  all personal demographics,  insurance information and for you to complete office paperwork. - All established patients should arrive 10-15 minutes earlier than appointment time to update all information and be checked in .  - In our best efforts to run on time, if you are late for your appointment you will be asked to either reschedule or if able, we will work you back into the schedule. There will be a wait time to work you back in the schedule,  depending on availability.  - If you are unable to make it to your appointment as scheduled, please call 24 hours ahead of time to allow [place] to fill the time slot with someone else who needs to be seen. If you do not cancel your appointment ahead of time, you may be charged a no show fee.

## 2018-11-27 MED FILL — SYNTHROID 100 MCG TABLET: 100 | 90 days supply | Qty: 90 | Fill #3

## 2018-12-29 NOTE — Progress Notes (Deleted)
58 y.o. [id number] Legally Separated White or Caucasian Hispanic or Latino female here for annual exam.      No LMP recorded.          Sexually active: {yes no:314532}  The current method of family planning is {contraception:315051}.    Exercising: {yes no:314532}  {types:19826} Smoker:  {YES [id number]  Health Maintenance: Pap:  12/25/2017 WNL NEG HPV History of abnormal Pap:  Yes had  Colposcopy- neg  MMG:  03-05-17 WNL  Colonoscopy:  2015 Normal per patient  BMD:   Never TDaP:  01-14-17 Gardasil: N/A   reports that she has never smoked. She has never used smokeless tobacco. She reports current alcohol use of about 1.0 standard drinks of alcohol per week. She reports that she does not use drugs.  Past Medical History:  Diagnosis Date  . Bilateral edema of lower extremity    tried on low dose lasix by prior provider  . Chicken pox   . Hemorrhoid   . HSV infection   . Hypothyroidism     Past Surgical History:  Procedure Laterality Date  . APPENDECTOMY  1973  . BREAST LUMPECTOMY Right 2014  . HEMORRHOID SURGERY    . TUBAL LIGATION  2004    Current Outpatient Medications  Medication Sig Dispense Refill  . Boric Acid POWD Apply one suppository vaginally at bedtime before menses or after    . Cholecalciferol (VITAMIN D) 50 MCG (2000 UT) tablet Take 2,000 Units by mouth daily.    . Diclofenac Sodium CR 100 MG 24 hr tablet Take 1 tablet (100 mg total) by mouth daily. 90 tablet 3  . levothyroxine (SYNTHROID) 100 MCG tablet Take 1 tablet (100 mcg total) by mouth daily before breakfast. Brand Name only 90 tablet 3  . valACYclovir (VALTREX) 500 MG tablet For oral hsv, take 4 tablets BID for one day. For genital hsv take one tablet BID for 3 days 30 tablet 2  . venlafaxine XR (EFFEXOR XR) 75 MG 24 hr capsule Take 1 capsule (75 mg total) by mouth daily with breakfast. 90 capsule 1   No current facility-administered medications for this visit.     Family History  Problem Relation Age of  Onset  . Diabetes Mother   . Valvular heart disease Father   . Breast cancer Maternal Aunt        50    Review of Systems  Exam:   There were no vitals taken for this visit.  Weight change: @WEIGHTCHANGE @ Height:      Ht Readings from Last 3 Encounters:  11/19/18 5' 4.5" (1.638 m)  10/27/18 5' 4.5" (1.638 m)  07/22/18 5\' 5"  (1.651 m)    General appearance: alert, cooperative and appears stated age Head: Normocephalic, without obvious abnormality, atraumatic Neck: no adenopathy, supple, symmetrical, trachea midline and thyroid {CHL AMB PHY EX THYROID NORM [phone]::"normal to inspection and palpation"} Lungs: clear to auscultation bilaterally Cardiovascular: regular rate and rhythm Breasts: {Exam; breast:13139::"normal appearance, no masses or tenderness"} Abdomen: soft, non-tender; non distended,  no masses,  no organomegaly Extremities: extremities normal, atraumatic, no cyanosis or edema Skin: Skin color, texture, turgor normal. No rashes or lesions Lymph nodes: Cervical, supraclavicular, and axillary nodes normal. No abnormal inguinal nodes palpated Neurologic: Grossly normal   Pelvic: External genitalia:  no lesions              Urethra:  normal appearing urethra with no masses, tenderness or lesions  Bartholins and Skenes: normal                 Vagina: normal appearing vagina with normal color and discharge, no lesions              Cervix: {CHL AMB PHY EX CERVIX NORM [phone]::"no lesions"}               Bimanual Exam:  Uterus:  {CHL AMB PHY EX UTERUS NORM [phone]::"normal size, contour, position, consistency, mobility, non-tender"}              Adnexa: {CHL AMB PHY EX ADNEXA NO MASS DEFAULT:[phone]::"no mass, fullness, tenderness"}               Rectovaginal: Confirms               Anus:  normal sphincter tone, no lesions  Chaperone was present for exam.  A:  Well Woman with normal exam  P:

## 2018-12-30 ENCOUNTER — Ambulatory Visit: Payer: No Typology Code available for payment source | Admitting: Obstetrics and Gynecology

## 2018-12-30 ENCOUNTER — Encounter: Payer: Self-pay | Admitting: Obstetrics and Gynecology

## 2019-01-07 ENCOUNTER — Other Ambulatory Visit: Payer: Self-pay | Admitting: Family Medicine

## 2019-01-07 DIAGNOSIS — M545 Low back pain, unspecified: Secondary | ICD-10-CM

## 2019-01-20 ENCOUNTER — Ambulatory Visit (INDEPENDENT_AMBULATORY_CARE_PROVIDER_SITE_OTHER): Payer: No Typology Code available for payment source | Admitting: Family Medicine

## 2019-01-20 ENCOUNTER — Telehealth: Payer: Self-pay | Admitting: Family Medicine

## 2019-01-20 ENCOUNTER — Encounter: Payer: Self-pay | Admitting: Family Medicine

## 2019-01-20 ENCOUNTER — Encounter: Payer: Self-pay | Admitting: Obstetrics and Gynecology

## 2019-01-20 ENCOUNTER — Other Ambulatory Visit: Payer: Self-pay

## 2019-01-20 VITALS — BP 113/73 | HR 76 | Temp 98.1°F | Resp 16 | Ht 65.0 in | Wt 181.4 lb

## 2019-01-20 DIAGNOSIS — R7309 Other abnormal glucose: Secondary | ICD-10-CM | POA: Diagnosis not present

## 2019-01-20 DIAGNOSIS — E039 Hypothyroidism, unspecified: Secondary | ICD-10-CM

## 2019-01-20 DIAGNOSIS — Z130 Encounter for screening for diseases of the blood and blood-forming organs and certain disorders involving the immune mechanism: Secondary | ICD-10-CM

## 2019-01-20 DIAGNOSIS — Z23 Encounter for immunization: Secondary | ICD-10-CM

## 2019-01-20 DIAGNOSIS — Z1211 Encounter for screening for malignant neoplasm of colon: Secondary | ICD-10-CM | POA: Diagnosis not present

## 2019-01-20 DIAGNOSIS — E559 Vitamin D deficiency, unspecified: Secondary | ICD-10-CM

## 2019-01-20 DIAGNOSIS — E669 Obesity, unspecified: Secondary | ICD-10-CM

## 2019-01-20 DIAGNOSIS — Z1231 Encounter for screening mammogram for malignant neoplasm of breast: Secondary | ICD-10-CM | POA: Diagnosis not present

## 2019-01-20 DIAGNOSIS — R232 Flushing: Secondary | ICD-10-CM | POA: Diagnosis not present

## 2019-01-20 DIAGNOSIS — Z79899 Other long term (current) drug therapy: Secondary | ICD-10-CM

## 2019-01-20 DIAGNOSIS — M545 Low back pain, unspecified: Secondary | ICD-10-CM

## 2019-01-20 DIAGNOSIS — Z0000 Encounter for general adult medical examination without abnormal findings: Secondary | ICD-10-CM | POA: Diagnosis not present

## 2019-01-20 LAB — COMPREHENSIVE METABOLIC PANEL
ALBUMIN: 4.1 g/dL (ref 3.5–5.2)
ALT: 14 U/L (ref 0–35)
AST: 19 U/L (ref 0–37)
Alkaline Phosphatase: 67 U/L (ref 39–117)
BILIRUBIN TOTAL: 0.4 mg/dL (ref 0.2–1.2)
BUN: 13 mg/dL (ref 6–23)
CALCIUM: 9.2 mg/dL (ref 8.4–10.5)
CHLORIDE: 102 meq/L (ref 96–112)
CO2: 31 mEq/L (ref 19–32)
CREATININE: 0.66 mg/dL (ref 0.40–1.20)
GFR: 92.65 mL/min (ref >60.00)
Glucose, Bld: 86 mg/dL (ref 70–99)
Potassium: 4.7 mEq/L (ref 3.5–5.1)
SODIUM: 139 meq/L (ref 135–145)
TOTAL PROTEIN: 6.8 g/dL (ref 6.0–8.3)

## 2019-01-20 LAB — LIPID PANEL
CHOLESTEROL: 258 mg/dL — AB (ref 0–200)
HDL: 66.2 mg/dL (ref >39.00)
LDL CALC: 163 mg/dL — AB (ref 0–99)
NONHDL: 191.69
Total CHOL/HDL Ratio: 4
Triglycerides: 142 mg/dL (ref 0.0–149.0)
VLDL: 28.4 mg/dL (ref 0.0–40.0)

## 2019-01-20 LAB — CBC
HCT: 38.3 % (ref 36.0–46.0)
Hemoglobin: 12.5 g/dL (ref 12.0–15.0)
MCHC: 32.7 g/dL (ref 30.0–36.0)
MCV: 89.8 fl (ref 78.0–100.0)
PLATELETS: 337 10*3/uL (ref 150.0–400.0)
RBC: 4.26 Mil/uL (ref 3.87–5.11)
RDW: 14.2 % (ref 11.5–15.5)
WBC: 5.7 10*3/uL (ref 4.0–10.5)

## 2019-01-20 LAB — HEMOGLOBIN A1C: HEMOGLOBIN A1C: 5.6 % (ref 4.6–6.5)

## 2019-01-20 LAB — TSH: TSH: 5.08 u[IU]/mL — AB (ref 0.35–4.50)

## 2019-01-20 MED ORDER — METAXALONE 400 MG PO TABS: 800.0000 mg | ORAL_TABLET | Freq: Two times a day (BID) | ORAL | 5 refills | Status: DC | PRN

## 2019-01-20 MED ORDER — VENLAFAXINE HCL ER 75 MG PO CP24: 75.0000 mg | ORAL_CAPSULE | Freq: Every day | ORAL | 3 refills | Status: DC

## 2019-01-20 MED ORDER — METAXALONE 800 MG PO TABS: 800.0000 mg | ORAL_TABLET | Freq: Three times a day (TID) | ORAL | 5 refills | Status: DC

## 2019-01-20 MED ORDER — LEVOTHYROXINE SODIUM 100 MCG PO TABS: ORAL_TABLET | ORAL | 3 refills | Status: DC

## 2019-01-20 NOTE — Addendum Note (Signed)
Addended by: Daryel November L on: 01/20/2019 04:58 PM   Modules accepted: Orders

## 2019-01-20 NOTE — Progress Notes (Signed)
Patient ID: Molly Park, female  DOB: 21-Oct-1961, 58 y.o.   MRN: [id number] Patient Care Team    Relationship Specialty Notifications Start End  Natalia Leatherwood, DO PCP - General Family Medicine  01/14/17   Romualdo Bolk, MD Consulting Physician Obstetrics and Gynecology  01/14/18     Chief Complaint  Patient presents with  . Annual Exam    Fasting. Needs refills. Pt continues to have back pain     Subjective:  Molly Park is a 58 y.o.  Female  present for CPE . All past medical history, surgical history, allergies, family history, immunizations, medications and social history were updated in the electronic medical record today. All recent labs, ED visits and hospitalizations within the last year were reviewed.  Hot flashes:  Doing well. Working well for her hot flashes.  Prior note:  She presents today to talk about her hot flashes.  She reports that the Celexa was working rather well until recently.  We have even increased her dose, which did not seem very effective for her.  She would like to try something else today. Prior note: pt has seen her gyn and told she is in perimenopause. She reports celexa 40 mg Qd was working really well, until she forgot 3 doses 3 weeks ago. She still feels they are improved from prior.   Health maintenance: updated 01/20/19 Colonoscopy: no fhx, screen completed 01/21/2014 w/ polyps,  at [facility]. She was told 5 year follow up.  Mammogram: completed: 03/05/2017, birads 1. Breast center. Cervical cancer screening: last pap: 2019, results: Dr. Oscar La  Neg hpv/nl pap. Immunizations: tdap 01/2017 UTD. Influenza UTD 2019 (encouraged yearly). Shingrix #1 provided today.  Infectious disease: HIV & Hep C completed Assistive device: none Oxygen WFU:XNAT Patient has a Dental home. Hospitalizations/ED visits: reviewed    Depression screen [facility] 2/9 01/20/2019 10/27/2018 07/22/2018 01/14/2018 12/19/2017  Decreased Interest 0 0 0  0 0  Down, Depressed, Hopeless 0 0 0 0 0  PHQ - 2 Score 0 0 0 0 0  Altered sleeping 0 1 - - -  Tired, decreased energy 0 3 - - -  Change in appetite 0 0 - - -  Feeling bad or failure about yourself  0 0 - - -  Trouble concentrating 0 0 - - -  Moving slowly or fidgety/restless 0 1 - - -  Suicidal thoughts 0 0 - - -  PHQ-9 Score 0 5 - - -  Difficult doing work/chores Not difficult at all Not difficult at all - - -   GAD 7 : Generalized Anxiety Score 01/20/2019 10/27/2018  Nervous, Anxious, on Edge 0 1  Control/stop worrying 0 1  Worry too much - different things 0 1  Trouble relaxing 0 1  Restless 0 1  Easily annoyed or irritable 0 0  Afraid - awful might happen 0 0  Total GAD 7 Score 0 5  Anxiety Difficulty Not difficult at all Not difficult at all    Immunization History  Administered Date(s) Administered  . Hepatitis A, Ped/Adol-2 Dose 12/07/2012  . Influenza, Seasonal, Injecte, Preservative Fre 09/15/2014  . Influenza-Unspecified 08/11/2016, 09/15/2018  . Tdap 01/14/2017    Past Medical History:  Diagnosis Date  . Bilateral edema of lower extremity    tried on low dose lasix by prior provider  . Chicken pox   . Hemorrhoid   . HSV infection   . Hypothyroidism    No Known Allergies Past Surgical  History:  Procedure Laterality Date  . APPENDECTOMY  1973  . BREAST LUMPECTOMY Right 2014  . HEMORRHOID SURGERY    . TUBAL LIGATION  2004   Family History  Problem Relation Age of Onset  . Diabetes Mother   . Valvular heart disease Father   . Breast cancer Maternal Aunt        53   Social History   Social History Narrative   Patient is married to [place]. They have 5 children btw them.   BA degree. 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   Drinks caffeine.   Wears a seatbelt, wears a bicycle helmet, smoke detector in the home.   Firearms in the home.   Exercises routinely.   Feels safe in her relationships.    Allergies as of  01/20/2019   No Known Allergies     Medication List       Accurate as of January 20, 2019  9:25 AM. Always use your most recent med list.        Boric Acid Powd Apply one suppository vaginally at bedtime before menses or after   Diclofenac Sodium CR 100 MG 24 hr tablet TAKE 1 TABLET (100 MG TOTAL) BY MOUTH DAILY.   levothyroxine 100 MCG tablet Commonly known as:  Synthroid Take 1 tablet (100 mcg total) by mouth daily before breakfast. Brand Name only   metaxalone 400 MG tablet Commonly known as:  SKELAXIN Take 2 tablets (800 mg total) by mouth 2 (two) times daily as needed.   valACYclovir 500 MG tablet Commonly known as:  VALTREX For oral hsv, take 4 tablets BID for one day. For genital hsv take one tablet BID for 3 days   venlafaxine XR 75 MG 24 hr capsule Commonly known as:  Effexor XR Take 1 capsule (75 mg total) by mouth daily with breakfast.   Vitamin D 50 MCG (2000 UT) tablet Take 2,000 Units by mouth daily.       All past medical history, surgical history, allergies, family history, immunizations andmedications were updated in the EMR today and reviewed under the history and medication portions of their EMR.     No results found for this or any previous visit (from the past 2160 hour(s)).  No results found.  ROS: 14 pt review of systems performed and negative (unless mentioned in an HPI)  Objective: BP 113/73 (BP Location: Left Arm, Patient Position: Sitting, Cuff Size: Normal)   Pulse 76   Temp 98.1 F (36.7 C) (Oral)   Resp 16   Ht 5\' 5"  (1.651 m)   Wt 181 lb 6 oz (82.3 kg)   LMP 01/05/2019   SpO2 97%   BMI 30.18 kg/m  Gen: Afebrile. No acute distress. Nontoxic in appearance, well-developed, well-nourished,  Pleasant female.  HENT: AT. [place]. Bilateral TM visualized and normal in appearance, normal external auditory canal. MMM, no oral lesions, adequate dentition. Bilateral nares within normal limits. Throat without erythema, ulcerations or exudates. no  Cough on exam, no hoarseness on exam. Eyes:Pupils Equal Round Reactive to light, Extraocular movements intact,  Conjunctiva without redness, discharge or icterus. Neck/lymp/endocrine: Supple,no lymphadenopathy, no thyromegaly CV: RRR no murmur, no edema, +2/4 P posterior tibialis pulses. no carotid bruits. No JVD. Chest: CTAB, no wheeze, rhonchi or crackles. normal Respiratory effort. good Air movement. Abd: Soft. flat. NTND. BS present. no Masses palpated. No hepatosplenomegaly. No rebound tenderness or guarding. Skin: no rashes, purpura or petechiae. Warm and well-perfused. Skin intact.  Neuro/Msk:  Normal gait. PERLA. EOMi. Alert. Oriented x3.  Cranial nerves II through XII intact. Muscle strength 5/5 upper/lower extremity. DTRs equal bilaterally. Psych: Normal affect, dress and demeanor. Normal speech. Normal thought content and judgment.   No exam data present  Assessment/plan: Dangela How is a 58 y.o. female present for CPE. Colon cancer screening - Ambulatory referral to Gastroenterology Breast cancer screening by mammogram - MM 3D SCREEN BREAST BILATERAL; Future Hypothyroidism, unspecified type/ Obesity (BMI 30-39.9) - pt reports compliance- refills provided on levothyroxine after results - Comprehensive metabolic panel - Lipid panel - TSH Elevated hemoglobin A1c - Hemoglobin A1c Screening for deficiency anemia - CBC Encounter for long-term current use of medication - Comprehensive metabolic panel Need for shingles vaccine - Varicella-zoster vaccine IM Lumbar pain Voltaren has been helpful. After heavy work outs she is experiencing spasms that keep her from her exercise  - continue Voltaren, stretches, added skelaxin.  Hot flashes: - stable. Continue  effexor. Continue 75 mg QD. Refills provided today.  Encounter for preventive health examination AVS provided to patient today for education/recommendation on gender specific health and safety maintenance. Colonoscopy:  no fhx, screen completed 01/21/2014 w/ polyps, at digestive health specialist. She was told 5 year follow up. Referral placed Mammogram: completed: 03/05/2017, birads 1. Breast center. Cervical cancer screening: last pap: 2019, results: Dr. Oscar La  Neg hpv/nl pap. Immunizations: tdap 01/2017 UTD. Influenza UTD 2019 (encouraged yearly). Shingrix #1 provided today.  Infectious disease: HIV & Hep C completed  Return in about 1 year (around 01/20/2020) for CPE. 6 months if needing refills on muscle relaxer for lumbar pain.   Electronically signed by: Felix Pacini, DO  Primary Care- [place]

## 2019-01-20 NOTE — Telephone Encounter (Signed)
Called and spoke with Pharmacy and they are going to change RX to 800mg  and said if the RX was not covered with insurance they would call to notified us

## 2019-01-20 NOTE — Patient Instructions (Signed)

## 2019-01-20 NOTE — Telephone Encounter (Signed)
Please advise if this is acceptable

## 2019-01-20 NOTE — Telephone Encounter (Signed)
Yes, but the 800 mg appeared to be not as well covered by her insurance.

## 2019-01-20 NOTE — Telephone Encounter (Signed)
Please inform patient the following information: Her labs were normal with the following exceptions:   - her thyroid was mildly under replaced. Continue same dose 6 days a wekk, except on one day a week take 1.5 tabs. New script has been called in for her.    Her cholesterol was also elevated- total cholesterol 258 and bad cholesterol (LDL) 163. With a LDL > 160, I would suggest a low dose statin, as well as routine exercise and high fiber/low saturated fat diet. Please advise her decision and I will call this in for her. If started follow up n 3 months with provider and with fasting labs

## 2019-01-20 NOTE — Telephone Encounter (Signed)
Copied from CRM [id number]. Topic: Quick Communication - See Telephone Encounter >> Jan 20, 2019 10:29 AM Debroah Loop wrote: CRM for notification. See Telephone encounter for: 01/20/19. Pharmacy calling because they only have 800mg  tablets of metaxalone (SKELAXIN) 400 MG tablet and wants to know if the script can be changed to 800mg ?

## 2019-01-21 NOTE — Telephone Encounter (Signed)
Left detailed message for patient regarding lab results and provider recommendations / new Rx's.  Patient advised to [facility] w/ decision on statin therapy.  Okay for PEC to discuss with patient when she returns call.

## 2019-01-26 NOTE — Telephone Encounter (Signed)
Pt would like to try exercise first and diet to see if she is able to bring her cholestnumbers down. She is asking if and when she can come back and recheck just labs to see if that is working or not, she does not want to wait a year until her next CPE.   Please advise.

## 2019-01-26 NOTE — Telephone Encounter (Signed)
If wanting to wait on statin and try diet and exercise only first--> follow up in 6 mos for recheck on cholesterol with fasting labs.

## 2019-01-26 NOTE — Telephone Encounter (Signed)
My Chart message sent to pt per patients request letting her know that she will need to be re-evaluated in 6 months with a OV and lab work

## 2019-02-18 MED FILL — VENLAFAXINE HCL ER 75 MG CA: 75 | 90 days supply | Qty: 90 | Fill #0

## 2019-02-18 MED FILL — SYNTHROID 100 MCG TABLET: 100 | 90 days supply | Qty: 96 | Fill #0

## 2019-04-06 ENCOUNTER — Ambulatory Visit: Payer: No Typology Code available for payment source

## 2019-04-23 MED FILL — DICLOFENAC SOD ER 100 MG TA: 100 | 90 days supply | Qty: 90 | Fill #0

## 2019-04-23 MED FILL — METAXALONE 800 MG TABLET: 800 | 10 days supply | Qty: 30 | Fill #0

## 2019-05-24 MED FILL — VENLAFAXINE HCL ER 75 MG CA: 75 | 90 days supply | Qty: 90 | Fill #0

## 2019-05-24 MED FILL — SYNTHROID 100 MCG TABLET: 100 | 90 days supply | Qty: 96 | Fill #0

## 2019-07-14 ENCOUNTER — Telehealth: Payer: Self-pay | Admitting: Obstetrics and Gynecology

## 2019-07-14 NOTE — Progress Notes (Signed)
GYNECOLOGY  VISIT   HPI: 58 y.o.   Legally Separated White or Caucasian Hispanic or Latino  female   [phone] with No LMP recorded (lmp unknown). (Menstrual status: Irregular Periods).   here for bladder pain that occurs when she wakes up in the morning. Denies fever, chills, back pain, urinary urgency or frequency. The bladder pain has been intermittent for the last month. Only feels the pain in the am when sh wakes up, doesn't feel overfull. She voids once at night. She can have a mild cramp when she gets up in the night. In the am she has an intense pressure/pain in her pelvis, feels full and heavy. Over the last week the pain has been every morning. As she voids the pressure improves. Normal stream. No leakage.  Sometimes she changes position to empty her bladder more. No vaginal bulge. No frequency, urgency or dysuria.  Sexually active, same partner x 3-4 years. No pain other than if her bladder is full, it is tender to push on her bladder. No dyspareunia.  No bowel c/o.   H/O lower back pain.   The patient was evaluated for abnormal perimenopausal bleeding last year. Normal u/s, normal pap and normal TSH. Treated with cyclic provera. She last took the provera in February or March of 2020, had a cycle. Then she was having monthly cycles until May or June and nothing since. When she bleeds, it lasts x 4 days, heavy x 1 day. She can saturate an ultra tampon in 3 hours. Occasional cramps. A lot of the last year she has had a monthly cycles. Hasn't taken the provera this summer. Her hot flashes have worsened. Can't loose weight.   GYNECOLOGIC HISTORY: No LMP recorded (lmp unknown). (Menstrual status: Irregular Periods). Contraception: Tubal ligation Menopausal hormone therapy: None        OB History    Gravida  5   Para  3   Term  2   Preterm  1   AB  2   Living  3     SAB  2   TAB      Ectopic      Multiple      Live Births  3              Patient Active Problem List    Diagnosis Date Noted  . Hot flashes 07/22/2018  . Elevated hemoglobin A1c 07/22/2018  . Dysphagia 07/22/2018  . Lumbar pain 01/14/2018  . Mass 12/09/2017  . Encounter for preventive health examination 01/14/2017  . Hypothyroidism 01/14/2017  . Vitamin D deficiency 01/14/2017    Past Medical History:  Diagnosis Date  . Bilateral edema of lower extremity    tried on low dose lasix by prior provider  . Chicken pox   . Hemorrhoid   . HSV infection   . Hypothyroidism     Past Surgical History:  Procedure Laterality Date  . APPENDECTOMY  1973  . BREAST LUMPECTOMY Right 2014  . HEMORRHOID SURGERY    . TUBAL LIGATION  2004    Current Outpatient Medications  Medication Sig Dispense Refill  . Boric Acid POWD Apply one suppository vaginally at bedtime before menses or after    . Cholecalciferol (VITAMIN D) 50 MCG (2000 UT) tablet Take 2,000 Units by mouth daily.    . Diclofenac Sodium CR 100 MG 24 hr tablet TAKE 1 TABLET (100 MG TOTAL) BY MOUTH DAILY. 90 tablet 3  . levothyroxine (SYNTHROID) 100 MCG tablet 1 tab (100  mcg) QD x6 days a week, 1.5 tabs (150 mcg) x1 day a week. 96 tablet 3  . metaxalone (SKELAXIN) 800 MG tablet Take 1 tablet (800 mg total) by mouth 3 (three) times daily. 30 tablet 5  . valACYclovir (VALTREX) 500 MG tablet For oral hsv, take 4 tablets BID for one day. For genital hsv take one tablet BID for 3 days 30 tablet 2  . venlafaxine XR (EFFEXOR XR) 75 MG 24 hr capsule Take 1 capsule (75 mg total) by mouth daily with breakfast. 90 capsule 3   No current facility-administered medications for this visit.      ALLERGIES: Patient has no known allergies.  Family History  Problem Relation Age of Onset  . Diabetes Mother   . Valvular heart disease Father   . Breast cancer Maternal Aunt        [id number]    Social History   Socioeconomic History  . Marital status: Legally Separated    Spouse name: Thereasa DistanceRodney  . Number of children: 5  . Years of education: BA  .  Highest education level: Not on file  Occupational History  . Occupation: Merchant navy officerractice admin  Social Needs  . Financial resource strain: Not on file  . Food insecurity    Worry: Not on file    Inability: Not on file  . Transportation needs    Medical: Not on file    Non-medical: Not on file  Tobacco Use  . Smoking status: Never Smoker  . Smokeless tobacco: Never Used  Substance and Sexual Activity  . Alcohol use: Yes    Alcohol/week: 1.0 standard drinks    Types: 1 Glasses of wine per week  . Drug use: No  . Sexual activity: Yes    Partners: Male    Birth control/protection: Surgical    Comment: Married  Lifestyle  . Physical activity    Days per week: Not on file    Minutes per session: Not on file  . Stress: Not on file  Relationships  . Social Musicianconnections    Talks on phone: Not on file    Gets together: Not on file    Attends religious service: Not on file    Active member of club or organization: Not on file    Attends meetings of clubs or organizations: Not on file    Relationship status: Not on file  . Intimate partner violence    Fear of current or ex partner: Not on file    Emotionally abused: Not on file    Physically abused: Not on file    Forced sexual activity: Not on file  Other Topics Concern  . Not on file  Social History Narrative   Patient is married to Enemy SwimRodney. They have 5 children btw them.   BA degree. 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   Drinks caffeine.   Wears a seatbelt, wears a bicycle helmet, smoke detector in the home.   Firearms in the home.   Exercises routinely.   Feels safe in her relationships.    Review of Systems  Constitutional: Negative.   HENT: Negative.   Eyes: Negative.   Respiratory: Negative.   Cardiovascular: Negative.   Gastrointestinal: Negative.   Genitourinary:       Bladder pain in the AM  Musculoskeletal: Negative.   Skin: Negative.   Neurological: Negative.    Endo/Heme/Allergies: Negative.   Psychiatric/Behavioral: Negative.     PHYSICAL EXAMINATION:    BP 118/80 (  BP Location: Right Arm, Patient Position: Sitting, Cuff Size: Normal)   Pulse 72   Temp (!) 97.2 F (36.2 C) (Skin)   Wt 182 lb (82.6 kg)   LMP  (LMP Unknown)   BMI 30.29 kg/m     General appearance: alert, cooperative and appears stated age Neck: no adenopathy, supple, symmetrical, trachea midline and thyroid normal to inspection and palpation Breasts: normal appearance, no masses or tenderness Abdomen: soft, mildly tender in the suprapubic region; non distended, no masses,  no organomegaly  Pelvic: External genitalia:  no lesions              Urethra:  normal appearing urethra with no masses, tenderness or lesions              Bartholins and Skenes: normal                 Vagina: normal appearing vagina with normal color and discharge, no lesions              Cervix: no lesions, large nabothian cyst              Bimanual Exam:  Uterus:  normal size, contour, position, consistency, mobility, non-tender              Adnexa: no mass, fullness, tenderness              Bladder: tender to palpation  Pelvic floor: not tender  Chaperone was present for exam.  ASSESSMENT Bladder pain with overfull bladder Perimenopausal, AUB    PLAN Send urine for ua, c&s If negative will place referral for urology Discussed avoiding irritants to her bladder Cyclic provera until she goes 6 months without a cycle.  Calendar cycles, f/u for annual exam next month   An After Visit Summary was printed and given to the patient.  ~25 minutes was spent

## 2019-07-14 NOTE — Telephone Encounter (Signed)
Patient is having bladder issues. 

## 2019-07-14 NOTE — Telephone Encounter (Signed)
Call to patient. Patient states she has been having "bladder issues for a little while." States she thought she had a UTI, so she did a urine dip and it was normal. Denies fever, chills, dysuria, frequency or urgency. States her symptoms are mostly in the morning when she wakes up. States "when I first wake it feels like my bladder is going to fall out and there is bad pain." Patient states she feels better after voiding. States she feels the same sensation if she holds her bladder for too long. OV recommended. Patient scheduled for 07-15-2019 at 0815- okay per K. Sprague, RN. Covid prescreen negative. 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 No current symptoms.   Routing to provider and will close encounter.

## 2019-07-15 ENCOUNTER — Ambulatory Visit (INDEPENDENT_AMBULATORY_CARE_PROVIDER_SITE_OTHER): Payer: No Typology Code available for payment source | Admitting: Obstetrics and Gynecology

## 2019-07-15 ENCOUNTER — Encounter: Payer: Self-pay | Admitting: Obstetrics and Gynecology

## 2019-07-15 VITALS — BP 118/80 | HR 72 | Temp 97.2°F | Wt 182.0 lb

## 2019-07-15 DIAGNOSIS — N951 Menopausal and female climacteric states: Secondary | ICD-10-CM | POA: Diagnosis not present

## 2019-07-15 DIAGNOSIS — N926 Irregular menstruation, unspecified: Secondary | ICD-10-CM | POA: Diagnosis not present

## 2019-07-15 DIAGNOSIS — R3989 Other symptoms and signs involving the genitourinary system: Secondary | ICD-10-CM

## 2019-07-15 LAB — POCT URINALYSIS DIPSTICK
Bilirubin, UA: NEGATIVE
Blood, UA: NEGATIVE
Glucose, UA: NEGATIVE
Ketones, UA: NEGATIVE
Nitrite, UA: NEGATIVE
Protein, UA: NEGATIVE
Spec Grav, UA: 1.01 (ref 1.010–1.025)
Urobilinogen, UA: 0.2 E.U./dL
pH, UA: 5 (ref 5.0–8.0)

## 2019-07-15 MED ORDER — MEDROXYPROGESTERONE ACETATE 5 MG PO TABS: ORAL_TABLET | ORAL | 1 refills | Status: DC

## 2019-07-15 MED FILL — MEDROXYPROGESTERONE 5 MG TA: 5 | 84 days supply | Qty: 15 | Fill #0

## 2019-07-15 NOTE — Patient Instructions (Signed)
Interstitial Cystitis  Interstitial cystitis is inflammation of the bladder. This may cause pain in the bladder area as well as a frequent and urgent need to urinate. The bladder is a hollow organ in the lower part of the abdomen. It stores urine after the urine is made in the kidneys. The severity of interstitial cystitis can vary from person to person. You may have flare-ups, and then your symptoms may go away for a while. For many people, it becomes a long-term (chronic) problem. What are the causes? The cause of this condition is not known. What increases the risk? The following factors may make you more likely to develop this condition:  You are female.  You have fibromyalgia.  You have irritable bowel syndrome (IBS).  You have endometriosis. This condition may be aggravated by:  Stress.  Smoking.  Spicy foods. What are the signs or symptoms? Symptoms of interstitial cystitis vary, and they can change over time. Symptoms may include:  Discomfort or pain in the bladder area, which is in the lower abdomen. Pain can range from mild to severe. The pain may change in intensity as the bladder fills with urine or as it empties.  Pain in the pelvic area, between the hip bones.  An urgent need to urinate.  Frequent urination.  Pain during urination.  Pain during sex.  Blood in the urine. For women, symptoms often get worse during menstruation. How is this diagnosed? This condition is diagnosed based on your symptoms, your medical history, and a physical exam. You may have tests to rule out other conditions, such as:  Urine tests.  Cystoscopy. For this test, a tool similar to a very thin telescope is used to look into your bladder.  Biopsy. This involves taking a sample of tissue from the bladder to be examined under a microscope. How is this treated? There is no cure for this condition, but treatment can help you control your symptoms. Work closely with your health care  provider to find the most effective treatments for you. Treatment options may include:  Medicines to relieve pain and reduce how often you feel the need to urinate.  Learning ways to control when you urinate (bladder training).  Lifestyle changes, such as changing your diet or taking steps to control stress.  Using a device that provides electrical stimulation to your nerves, which can relieve pain (neuromodulation therapy). The device is placed on your back, where it blocks the nerves that cause you to feel pain in your bladder area.  A procedure that stretches your bladder by filling it with air or fluid.  Surgery. This is rare. It is only done for extreme cases, if other treatments do not help. Follow these instructions at home: Bladder training   Use bladder training techniques as directed. Techniques may include: ? Urinating at scheduled times. ? Training yourself to delay urination. ? Doing exercises (Kegel exercises) to strengthen the muscles that control urine flow.  Keep a bladder diary. ? Write down the times that you urinate and any symptoms that you have. This can help you find out which foods, liquids, or activities make your symptoms worse. ? Use your bladder diary to schedule bathroom trips. If you are away from home, plan to be near a bathroom at each of your scheduled times.  Make sure that you urinate just before you leave the house and just before you go to bed. Eating and drinking  Make dietary changes as recommended by your health care provider. You   may need to avoid: ? Spicy foods. ? Foods that contain a lot of potassium.  Limit your intake of beverages that make you need to urinate. These include: ? Caffeinated beverages like soda, coffee, and tea. ? Alcohol. General instructions  Take over-the-counter and prescription medicines only as told by your health care provider.  Do not drink alcohol.  You can try a warm or cool compress over your bladder for  comfort.  Avoid wearing tight clothing.  Do not use any products that contain nicotine or tobacco, such as cigarettes and e-cigarettes. If you need help quitting, ask your health care provider.  Keep all follow-up visits as told by your health care provider. This is important. Contact a health care provider if you have:  Symptoms that do not get better with treatment.  Pain or discomfort that gets worse.  More frequent urges to urinate.  A fever. Get help right away if:  You have no control over when you urinate. Summary  Interstitial cystitis is inflammation of the bladder.  This condition may cause pain in the bladder area as well as a frequent and urgent need to urinate.  You may have flare-ups of the condition, and then it may go away for a while. For many people, it becomes a long-term (chronic) problem.  There is no cure for interstitial cystitis, but treatment methods are available to control your symptoms. This information is not intended to replace advice given to you by your health care provider. Make sure you discuss any questions you have with your health care provider. Document Released: 06/28/2004 Document Revised: 10/10/2017 Document Reviewed: 09/22/2017 Elsevier Patient Education  2020 [employer]  

## 2019-07-16 LAB — URINALYSIS, MICROSCOPIC ONLY
Bacteria, UA: NONE SEEN
Casts: NONE SEEN /lpf

## 2019-07-17 LAB — URINE CULTURE

## 2019-07-20 ENCOUNTER — Other Ambulatory Visit: Payer: Self-pay | Admitting: Obstetrics and Gynecology

## 2019-07-20 DIAGNOSIS — R3989 Other symptoms and signs involving the genitourinary system: Secondary | ICD-10-CM

## 2019-07-20 NOTE — Progress Notes (Signed)
Urinalysis and culture negative, referral placed for Urology consultation for bladder pain.

## 2019-07-22 MED FILL — METAXALONE 800 MG TABS: 800 | 10 days supply | Qty: 30 | Fill #1

## 2019-07-22 MED FILL — DICLOFENAC SOD ER 100 MG TA: 100 | 90 days supply | Qty: 90 | Fill #1

## 2019-08-07 ENCOUNTER — Other Ambulatory Visit: Payer: Self-pay

## 2019-08-07 ENCOUNTER — Encounter (HOSPITAL_BASED_OUTPATIENT_CLINIC_OR_DEPARTMENT_OTHER): Payer: Self-pay | Admitting: *Deleted

## 2019-08-07 ENCOUNTER — Emergency Department (HOSPITAL_BASED_OUTPATIENT_CLINIC_OR_DEPARTMENT_OTHER)
Admission: EM | Admit: 2019-08-07 | Discharge: 2019-08-08 | Disposition: A | Discharge status: Home / Self Care | Payer: No Typology Code available for payment source | Attending: Emergency Medicine | Admitting: Emergency Medicine

## 2019-08-07 ENCOUNTER — Emergency Department (HOSPITAL_BASED_OUTPATIENT_CLINIC_OR_DEPARTMENT_OTHER): Payer: No Typology Code available for payment source

## 2019-08-07 DIAGNOSIS — Y999 Unspecified external cause status: Secondary | ICD-10-CM | POA: Diagnosis not present

## 2019-08-07 DIAGNOSIS — Y9389 Activity, other specified: Secondary | ICD-10-CM | POA: Diagnosis not present

## 2019-08-07 DIAGNOSIS — Z79899 Other long term (current) drug therapy: Secondary | ICD-10-CM | POA: Diagnosis not present

## 2019-08-07 DIAGNOSIS — E039 Hypothyroidism, unspecified: Secondary | ICD-10-CM | POA: Insufficient documentation

## 2019-08-07 DIAGNOSIS — S52591A Other fractures of lower end of right radius, initial encounter for closed fracture: Secondary | ICD-10-CM | POA: Insufficient documentation

## 2019-08-07 DIAGNOSIS — S52501A Unspecified fracture of the lower end of right radius, initial encounter for closed fracture: Secondary | ICD-10-CM

## 2019-08-07 DIAGNOSIS — Z20828 Contact with and (suspected) exposure to other viral communicable diseases: Secondary | ICD-10-CM | POA: Insufficient documentation

## 2019-08-07 DIAGNOSIS — R0789 Other chest pain: Secondary | ICD-10-CM | POA: Diagnosis not present

## 2019-08-07 DIAGNOSIS — Y92410 Unspecified street and highway as the place of occurrence of the external cause: Secondary | ICD-10-CM | POA: Diagnosis not present

## 2019-08-07 DIAGNOSIS — S6991XA Unspecified injury of right wrist, hand and finger(s), initial encounter: Secondary | ICD-10-CM | POA: Diagnosis present

## 2019-08-07 MED ORDER — SODIUM CHLORIDE 0.9 % IV BOLUS
1000.0000 mL | Freq: Once | INTRAVENOUS | Status: AC
  Administered 2019-08-07: 23:00:00 1000 mL via INTRAVENOUS

## 2019-08-07 MED ORDER — HYDROCODONE-ACETAMINOPHEN 5-325 MG PO TABS: 1.0000 | ORAL_TABLET | ORAL | 0 refills | Status: DC | PRN

## 2019-08-07 MED ORDER — HYDROMORPHONE HCL 1 MG/ML IJ SOLN
1.0000 mg | Freq: Once | INTRAMUSCULAR | Status: AC
  Administered 2019-08-07: 1 mg via INTRAVENOUS
  Filled 2019-08-07: qty 1

## 2019-08-07 MED ORDER — PROPOFOL 10 MG/ML IV BOLUS
INTRAVENOUS | Status: AC | PRN
  Administered 2019-08-07: 80 mg via INTRAVENOUS

## 2019-08-07 MED ORDER — HYDROMORPHONE HCL 1 MG/ML IJ SOLN
1.0000 mg | INTRAMUSCULAR | Status: DC | PRN
  Administered 2019-08-07: 22:00:00 1 mg via INTRAVENOUS
  Filled 2019-08-07: qty 1

## 2019-08-07 MED ORDER — HYDROCODONE-ACETAMINOPHEN 5-325 MG PO TABS
1.0000 | ORAL_TABLET | Freq: Once | ORAL | Status: AC
  Administered 2019-08-08: 01:00:00 1 via ORAL
  Filled 2019-08-07: qty 1

## 2019-08-07 MED ORDER — PROPOFOL 10 MG/ML IV BOLUS
1.0000 mg/kg | Freq: Once | INTRAVENOUS | Status: DC
  Filled 2019-08-07: qty 20
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
## 2019-08-07 NOTE — ED Notes (Addendum)
Pt's boyfriend updated per her request.

## 2019-08-07 NOTE — Sedation Documentation (Signed)
Reduction complete, splint underway 

## 2019-08-07 NOTE — ED Notes (Signed)
Ring removed from right ring finger due to swelling. Placed in a urine cup. CMS intact

## 2019-08-07 NOTE — Sedation Documentation (Signed)
Procedure started, patient sedated.

## 2019-08-07 NOTE — ED Notes (Addendum)
Once patient become coming around after the sedation. EMT asked patient if she could feel her fingers and move them. Patient moved all 5 fingers and stated " My fingers do not feel numb or tingly anymore they feel good."  CMS intact with <2 sec cap refill. Dr. Regenia Skeeter observed the splint being placed. Sling was placed once patient began waking up.

## 2019-08-07 NOTE — ED Notes (Addendum)
Patient stated " all 4 digits are tingling and becoming numb except the thumb". Swelling noted to right and partial forearm and fingers.
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
## 2019-08-07 NOTE — Discharge Instructions (Addendum)
Call Dr. Levell July office on Monday, 9/28. You are being tested for the novel coronavirus today in preparation for surgery. Please self-isolate until you see the orthopedist.

## 2019-08-08 DIAGNOSIS — S52591A Other fractures of lower end of right radius, initial encounter for closed fracture: Secondary | ICD-10-CM | POA: Diagnosis not present

## 2019-08-09 LAB — NOVEL CORONAVIRUS, NAA (HOSP ORDER, SEND-OUT TO REF LAB; TAT 18-24 HRS): SARS-CoV-2, NAA: NOT DETECTED

## 2019-08-10 ENCOUNTER — Other Ambulatory Visit: Payer: Self-pay | Admitting: Orthopedic Surgery

## 2019-08-10 ENCOUNTER — Encounter: Payer: Self-pay | Admitting: Obstetrics and Gynecology

## 2019-08-10 ENCOUNTER — Other Ambulatory Visit (HOSPITAL_COMMUNITY)
Admission: RE | Admit: 2019-08-10 | Discharge: 2019-08-10 | Disposition: A | Discharge status: Home / Self Care | Payer: No Typology Code available for payment source | Source: Ambulatory Visit | Attending: Surgery | Admitting: Surgery

## 2019-08-10 ENCOUNTER — Encounter (HOSPITAL_BASED_OUTPATIENT_CLINIC_OR_DEPARTMENT_OTHER): Payer: Self-pay | Admitting: *Deleted

## 2019-08-10 ENCOUNTER — Other Ambulatory Visit: Payer: Self-pay

## 2019-08-10 DIAGNOSIS — Z20828 Contact with and (suspected) exposure to other viral communicable diseases: Secondary | ICD-10-CM | POA: Insufficient documentation

## 2019-08-10 DIAGNOSIS — Z01812 Encounter for preprocedural laboratory examination: Secondary | ICD-10-CM | POA: Diagnosis present

## 2019-08-10 LAB — SARS CORONAVIRUS 2 (TAT 6-24 HRS): SARS Coronavirus 2: NEGATIVE

## 2019-08-11 ENCOUNTER — Telehealth: Payer: Self-pay | Admitting: Obstetrics and Gynecology

## 2019-08-11 ENCOUNTER — Other Ambulatory Visit: Payer: Self-pay | Admitting: Orthopedic Surgery
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
## 2019-08-11 NOTE — Telephone Encounter (Signed)
Call to patient. Patient states she is having surgery on 08-12-2019. Requesting to reschedule aex. AEX rescheduled to Thursday 08/26/2019 at 1130. Patient agreeable to date and time of appointment.   Routing to provider and will close encounter.

## 2019-08-12 ENCOUNTER — Encounter (HOSPITAL_BASED_OUTPATIENT_CLINIC_OR_DEPARTMENT_OTHER): Payer: Self-pay | Admitting: *Deleted

## 2019-08-12 ENCOUNTER — Ambulatory Visit (HOSPITAL_BASED_OUTPATIENT_CLINIC_OR_DEPARTMENT_OTHER)
Admission: RE | Admit: 2019-08-12 | Discharge: 2019-08-12 | Disposition: A | Discharge status: Home / Self Care | Payer: No Typology Code available for payment source | Attending: Orthopedic Surgery | Admitting: Orthopedic Surgery

## 2019-08-12 ENCOUNTER — Ambulatory Visit (HOSPITAL_BASED_OUTPATIENT_CLINIC_OR_DEPARTMENT_OTHER): Payer: No Typology Code available for payment source | Admitting: Anesthesiology

## 2019-08-12 ENCOUNTER — Other Ambulatory Visit: Payer: Self-pay | Admitting: Family Medicine

## 2019-08-12 ENCOUNTER — Encounter (HOSPITAL_BASED_OUTPATIENT_CLINIC_OR_DEPARTMENT_OTHER)
Admission: RE | Disposition: A | Discharge status: Home / Self Care | Payer: Self-pay | Source: Home / Self Care | Attending: Orthopedic Surgery

## 2019-08-12 ENCOUNTER — Other Ambulatory Visit: Payer: Self-pay

## 2019-08-12 DIAGNOSIS — F329 Major depressive disorder, single episode, unspecified: Secondary | ICD-10-CM | POA: Diagnosis not present

## 2019-08-12 DIAGNOSIS — Z803 Family history of malignant neoplasm of breast: Secondary | ICD-10-CM | POA: Insufficient documentation

## 2019-08-12 DIAGNOSIS — Z833 Family history of diabetes mellitus: Secondary | ICD-10-CM | POA: Diagnosis not present

## 2019-08-12 DIAGNOSIS — Z8249 Family history of ischemic heart disease and other diseases of the circulatory system: Secondary | ICD-10-CM | POA: Diagnosis not present

## 2019-08-12 DIAGNOSIS — E039 Hypothyroidism, unspecified: Secondary | ICD-10-CM | POA: Insufficient documentation

## 2019-08-12 DIAGNOSIS — S52571A Other intraarticular fracture of lower end of right radius, initial encounter for closed fracture: Secondary | ICD-10-CM | POA: Insufficient documentation

## 2019-08-12 DIAGNOSIS — Y939 Activity, unspecified: Secondary | ICD-10-CM | POA: Insufficient documentation

## 2019-08-12 HISTORY — PX: OPEN REDUCTION INTERNAL FIXATION (ORIF) DISTAL RADIAL FRACTURE: SHX5989

## 2019-08-12 HISTORY — DX: Other specified postprocedural states: Z98.890

## 2019-08-12 HISTORY — DX: Other specified postprocedural states: R11.2

## 2019-08-12 SURGERY — OPEN REDUCTION INTERNAL FIXATION (ORIF) DISTAL RADIUS FRACTURE
Anesthesia: Monitor Anesthesia Care | Site: Wrist | Laterality: Right

## 2019-08-12 MED ORDER — MIDAZOLAM HCL 2 MG/2ML IJ SOLN
1.0000 mg | INTRAMUSCULAR | Status: DC | PRN
  Administered 2019-08-12: 2 mg via INTRAVENOUS

## 2019-08-12 MED ORDER — CLONIDINE HCL (ANALGESIA) 100 MCG/ML EP SOLN
EPIDURAL | Status: DC | PRN
  Administered 2019-08-12: 50 ug

## 2019-08-12 MED ORDER — CHLORHEXIDINE GLUCONATE 4 % EX LIQD: 60.0000 mL | Freq: Once | CUTANEOUS | Status: DC

## 2019-08-12 MED ORDER — EPHEDRINE 5 MG/ML INJ
INTRAVENOUS | Status: AC
  Filled 2019-08-12: qty 10

## 2019-08-12 MED ORDER — ROPIVACAINE HCL 7.5 MG/ML IJ SOLN
INTRAMUSCULAR | Status: DC | PRN
  Administered 2019-08-12: 20 mL via PERINEURAL

## 2019-08-12 MED ORDER — ONDANSETRON HCL 4 MG/2ML IJ SOLN
INTRAMUSCULAR | Status: DC | PRN
  Administered 2019-08-12: 4 mg via INTRAVENOUS

## 2019-08-12 MED ORDER — SCOPOLAMINE 1 MG/3DAYS TD PT72: 1.0000 | MEDICATED_PATCH | Freq: Once | TRANSDERMAL | Status: DC

## 2019-08-12 MED ORDER — ONDANSETRON HCL 4 MG/2ML IJ SOLN
INTRAMUSCULAR | Status: AC
  Filled 2019-08-12: qty 2

## 2019-08-12 MED ORDER — CEFAZOLIN SODIUM-DEXTROSE 2-4 GM/100ML-% IV SOLN
2.0000 g | INTRAVENOUS | Status: AC
  Administered 2019-08-12 (×2): 2 g via INTRAVENOUS

## 2019-08-12 MED ORDER — CEFAZOLIN SODIUM-DEXTROSE 2-4 GM/100ML-% IV SOLN
INTRAVENOUS | Status: AC
  Filled 2019-08-12: qty 100

## 2019-08-12 MED ORDER — ACETAMINOPHEN 500 MG PO TABS
1000.0000 mg | ORAL_TABLET | Freq: Once | ORAL | Status: AC
  Administered 2019-08-12: 1000 mg via ORAL

## 2019-08-12 MED ORDER — LACTATED RINGERS IV SOLN
INTRAVENOUS | Status: DC
  Administered 2019-08-12: 12:00:00 via INTRAVENOUS

## 2019-08-12 MED ORDER — PROPOFOL 10 MG/ML IV BOLUS
INTRAVENOUS | Status: DC | PRN
  Administered 2019-08-12: 40 mg via INTRAVENOUS

## 2019-08-12 MED ORDER — SUCCINYLCHOLINE CHLORIDE 200 MG/10ML IV SOSY
PREFILLED_SYRINGE | INTRAVENOUS | Status: AC
  Filled 2019-08-12: qty 10

## 2019-08-12 MED ORDER — FENTANYL CITRATE (PF) 100 MCG/2ML IJ SOLN
INTRAMUSCULAR | Status: AC
  Filled 2019-08-12: qty 2

## 2019-08-12 MED ORDER — MIDAZOLAM HCL 2 MG/2ML IJ SOLN
INTRAMUSCULAR | Status: AC
  Filled 2019-08-12: qty 2

## 2019-08-12 MED ORDER — PROPOFOL 500 MG/50ML IV EMUL
INTRAVENOUS | Status: DC | PRN
  Administered 2019-08-12: 100 ug/kg/min via INTRAVENOUS

## 2019-08-12 MED ORDER — FENTANYL CITRATE (PF) 100 MCG/2ML IJ SOLN
50.0000 ug | INTRAMUSCULAR | Status: DC | PRN
  Administered 2019-08-12: 50 ug via INTRAVENOUS

## 2019-08-12 MED ORDER — LIDOCAINE 2% (20 MG/ML) 5 ML SYRINGE
INTRAMUSCULAR | Status: AC
  Filled 2019-08-12: qty 5

## 2019-08-12 MED ORDER — ONDANSETRON HCL 4 MG PO TABS: 4.0000 mg | ORAL_TABLET | Freq: Three times a day (TID) | ORAL | 0 refills | Status: DC | PRN

## 2019-08-12 MED ORDER — FENTANYL CITRATE (PF) 100 MCG/2ML IJ SOLN: 25.0000 ug | INTRAMUSCULAR | Status: DC | PRN

## 2019-08-12 MED ORDER — PHENYLEPHRINE 40 MCG/ML (10ML) SYRINGE FOR IV PUSH (FOR BLOOD PRESSURE SUPPORT)
PREFILLED_SYRINGE | INTRAVENOUS | Status: AC
  Filled 2019-08-12: qty 10

## 2019-08-12 MED ORDER — ACETAMINOPHEN 500 MG PO TABS
ORAL_TABLET | ORAL | Status: AC
  Filled 2019-08-12: qty 2

## 2019-08-12 MED ORDER — HYDROCODONE-ACETAMINOPHEN 5-325 MG PO TABS: ORAL_TABLET | ORAL | 0 refills | Status: DC

## 2019-08-12 MED FILL — HYDROCODON-APAP 5-325: 5-325 | 4 days supply | Qty: 30 | Fill #0

## 2019-08-12 MED FILL — ONDANSETRON HCL 4 MG TABLET: 4 | 6 days supply | Qty: 20 | Fill #0

## 2019-08-12 SURGICAL SUPPLY — 58 items
BIT DRILL 2.0 LNG QUCK RELEASE (BIT) ×1 IMPLANT
BIT DRILL 2.8X5 QR DISP (BIT) ×2 IMPLANT
BLADE SURG 15 STRL LF DISP TIS (BLADE) ×2 IMPLANT
BLADE SURG 15 STRL SS (BLADE) ×2
BNDG ELASTIC 3X5.8 VLCR STR LF (GAUZE/BANDAGES/DRESSINGS) ×2 IMPLANT
BNDG ESMARK 4X9 LF (GAUZE/BANDAGES/DRESSINGS) ×2 IMPLANT
BNDG GAUZE ELAST 4 BULKY (GAUZE/BANDAGES/DRESSINGS) ×2 IMPLANT
BNDG PLASTER X FAST 3X3 WHT LF (CAST SUPPLIES) ×20 IMPLANT
CHLORAPREP W/TINT 26 (MISCELLANEOUS) ×2 IMPLANT
CORD BIPOLAR FORCEPS 12FT (ELECTRODE) ×2 IMPLANT
COVER BACK TABLE REUSABLE LG (DRAPES) ×2 IMPLANT
COVER MAYO STAND REUSABLE (DRAPES) ×2 IMPLANT
COVER WAND RF STERILE (DRAPES) IMPLANT
CUFF TOURN SGL QUICK 18X4 (TOURNIQUET CUFF) IMPLANT
CUFF TOURN SGL QUICK 24 (TOURNIQUET CUFF)
CUFF TRNQT CYL 24X4X16.5-23 (TOURNIQUET CUFF) IMPLANT
DRAPE EXTREMITY T 121X128X90 (DISPOSABLE) ×2 IMPLANT
DRAPE OEC MINIVIEW 54X84 (DRAPES) ×2 IMPLANT
DRAPE SURG 17X23 STRL (DRAPES) ×2 IMPLANT
DRILL 2.0 LNG QUICK RELEASE (BIT) ×2
GAUZE SPONGE 4X4 12PLY STRL (GAUZE/BANDAGES/DRESSINGS) ×2 IMPLANT
GAUZE XEROFORM 1X8 LF (GAUZE/BANDAGES/DRESSINGS) ×2 IMPLANT
GLOVE BIO SURGEON STRL SZ7 (GLOVE) ×2 IMPLANT
GLOVE BIO SURGEON STRL SZ7.5 (GLOVE) ×2 IMPLANT
GLOVE BIOGEL PI IND STRL 7.5 (GLOVE) ×1 IMPLANT
GLOVE BIOGEL PI IND STRL 8 (GLOVE) ×3 IMPLANT
GLOVE BIOGEL PI IND STRL 8.5 (GLOVE) IMPLANT
GLOVE BIOGEL PI INDICATOR 7.5 (GLOVE) ×1
GLOVE BIOGEL PI INDICATOR 8 (GLOVE) ×3
GLOVE BIOGEL PI INDICATOR 8.5 (GLOVE)
GLOVE SURG ORTHO 8.0 STRL STRW (GLOVE) IMPLANT
GOWN STRL REUS W/ TWL LRG LVL3 (GOWN DISPOSABLE) ×1 IMPLANT
GOWN STRL REUS W/TWL LRG LVL3 (GOWN DISPOSABLE) ×1
GOWN STRL REUS W/TWL XL LVL3 (GOWN DISPOSABLE) ×2 IMPLANT
GUIDEWIRE ORTHO 0.054X6 (WIRE) ×6 IMPLANT
NEEDLE HYPO 25X1 1.5 SAFETY (NEEDLE) IMPLANT
NS IRRIG 1000ML POUR BTL (IV SOLUTION) ×2 IMPLANT
PACK BASIN DAY SURGERY FS (CUSTOM PROCEDURE TRAY) ×2 IMPLANT
PAD CAST 3X4 CTTN HI CHSV (CAST SUPPLIES) ×1 IMPLANT
PADDING CAST COTTON 3X4 STRL (CAST SUPPLIES) ×1
PLATE ACULOCK 2 NARROW RT (Plate) ×2 IMPLANT
SCREW 2.3X12MM (Screw) ×2 IMPLANT
SCREW ACTK 2 NL HEX 3.5.11 (Screw) ×2 IMPLANT
SCREW BN FT 16X2.3XLCK HEX CRT (Screw) ×2 IMPLANT
SCREW CORTICAL LOCKING 2.3X16M (Screw) ×5 IMPLANT
SCREW CORTICAL LOCKING 2.3X18M (Screw) ×1 IMPLANT
SCREW FX16X2.3XLCK SMTH NS CRT (Screw) ×3 IMPLANT
SCREW FX18X2.3XSMTH LCK NS CRT (Screw) ×1 IMPLANT
SCREW NONLOCK HEX 3.5X12 (Screw) ×4 IMPLANT
SLEEVE SCD COMPRESS KNEE MED (MISCELLANEOUS) IMPLANT
SLING ARM FOAM STRAP MED (SOFTGOODS) ×2 IMPLANT
STOCKINETTE 4X48 STRL (DRAPES) ×2 IMPLANT
SUT ETHILON 4 0 PS 2 18 (SUTURE) ×2 IMPLANT
SUT VICRYL 4-0 PS2 18IN ABS (SUTURE) ×2 IMPLANT
SYR BULB 3OZ (MISCELLANEOUS) ×2 IMPLANT
SYR CONTROL 10ML LL (SYRINGE) IMPLANT
TOWEL GREEN STERILE FF (TOWEL DISPOSABLE) ×4 IMPLANT
UNDERPAD 30X36 HEAVY ABSORB (UNDERPADS AND DIAPERS) ×2 IMPLANT

## 2019-08-12 NOTE — Op Note (Signed)
I assisted Surgeon(s) and Role:    * Leanora Cover, MD - Primary    Daryll Brod, MD - Assisting on the Procedure(s): OPEN REDUCTION INTERNAL FIXATION (ORIF) DISTAL RADIAL FRACTURE on 08/12/2019.  I provided assistance on this case as follows: setup, approach, identification, debridement, reduction,stabilization, fixation with plate and screws of the fracture,closure of the incisions and application of the dressings and splint. Electronically signed by: Daryll Brod, MD Date: 08/12/2019 Time: 2:42 PM
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
## 2019-08-12 NOTE — Transfer of Care (Signed)
Immediate Anesthesia Transfer of Care Note  Patient: Molly Park  Procedure(s) Performed: OPEN REDUCTION INTERNAL FIXATION (ORIF) DISTAL RADIAL FRACTURE (Right Wrist)  Patient Location: PACU  Anesthesia Type:MAC combined with regional for post-op pain  Level of Consciousness: awake, alert  and oriented  Airway & Oxygen Therapy: Patient Spontanous Breathing and Patient connected to face mask oxygen  Post-op Assessment: Report given to RN and Post -op Vital signs reviewed and stable  Post vital signs: Reviewed and stable  Last Vitals:  Vitals Value Taken Time  BP 126/61 08/12/19 1447  Temp    Pulse 70 08/12/19 1449  Resp 16 08/12/19 1449  SpO2 96 % 08/12/19 1449  Vitals shown include unvalidated device data.  Last Pain:  Vitals:   08/12/19 1143  TempSrc: Oral  PainSc: 2       Patients Stated Pain Goal: 6 (12/75/17 0017)  Complications: No apparent anesthesia complications

## 2019-08-12 NOTE — Anesthesia Postprocedure Evaluation (Signed)
Anesthesia Post Note  Patient: Latanya Hemmer  Procedure(s) Performed: OPEN REDUCTION INTERNAL FIXATION (ORIF) DISTAL RADIAL FRACTURE (Right Wrist)     Patient location during evaluation: PACU Anesthesia Type: Regional and MAC Level of consciousness: awake and alert Pain management: pain level controlled Vital Signs Assessment: post-procedure vital signs reviewed and stable Respiratory status: spontaneous breathing, nonlabored ventilation, respiratory function stable and patient connected to nasal cannula oxygen Cardiovascular status: stable and blood pressure returned to baseline Postop Assessment: no apparent nausea or vomiting Anesthetic complications: no    Last Vitals:  Vitals:   08/12/19 1448 08/12/19 1500  BP:  (!) 143/74  Pulse: 72 (!) 59  Resp: 18 16  Temp:    SpO2: 95% 96%    Last Pain:  Vitals:   08/12/19 1500  TempSrc:   PainSc: 0-No pain                 Latorie Montesano L Rio Taber

## 2019-08-12 NOTE — Anesthesia Procedure Notes (Signed)
Anesthesia Regional Block: Supraclavicular block   Pre-Anesthetic Checklist: ,, timeout performed, Correct Patient, Correct Site, Correct Laterality, Correct Procedure, Correct Position, site marked, Risks and benefits discussed,  Surgical consent,  Pre-op evaluation,  At surgeon's request and post-op pain management  Laterality: Right  Prep: Maximum Sterile Barrier Precautions used, chloraprep       Needles:  Injection technique: Single-shot  Needle Type: Echogenic Stimulator Needle     Needle Length: 4cm  Needle Gauge: 22     Additional Needles:   Procedures:,,,, ultrasound used (permanent image in chart),,,,  Narrative:  Start time: 08/12/2019 1:00 PM End time: 08/12/2019 1:10 PM Injection made incrementally with aspirations every 5 mL.  Performed by: Personally  Anesthesiologist: Freddrick March, MD  Additional Notes: Monitors applied. No increased pain on injection. No increased resistance to injection. Injection made in 5cc increments. Good needle visualization. Patient tolerated procedure well.

## 2019-08-12 NOTE — Progress Notes (Signed)
Assisted Dr. Woodrum with right, ultrasound guided, supraclavicular block. Side rails up, monitors on throughout procedure. See vital signs in flow sheet. Tolerated Procedure well. 

## 2019-08-12 NOTE — Discharge Instructions (Addendum)
[facility] Instructions Hand Surgery  Wound Care: Keep your hand elevated above the level of your heart.  Do not allow it to dangle by your side.  Keep the dressing dry and do not remove it unless your doctor advises you to do so.  He will usually change it at the time of your post-op visit.  Moving your fingers is advised to stimulate circulation but will depend on the site of your surgery.  If you have a splint applied, your doctor will advise you regarding movement.  Activity: Do not drive or operate machinery today.  Rest today and then you may return to your normal activity and work as indicated by your physician.  Diet:  Drink liquids today or eat a light diet.  You may resume a regular diet tomorrow.     Post Anesthesia Home Care Instructions  Activity: Get plenty of rest for the remainder of the day. A responsible individual must stay with you for 24 hours following the procedure.  For the next 24 hours, DO NOT: -Drive a car -Operate machinery -Drink alcoholic beverages -Take any medication unless instructed by your physician -Make any legal decisions or sign important papers.  Meals: Start with liquid foods such as gelatin or soup. Progress to regular foods as tolerated. Avoid greasy, spicy, heavy foods. If nausea and/or vomiting occur, drink only clear liquids until the nausea and/or vomiting subsides. Call your physician if vomiting continues.  Special Instructions/Symptoms: Your throat may feel dry or sore from the anesthesia or the breathing tube placed in your throat during surgery. If this causes discomfort, gargle with warm salt water. The discomfort should disappear within 24 hours.  If you had a scopolamine patch placed behind your ear for the management of post- operative nausea and/or vomiting:  1. The medication in the patch is effective for 72 hours, after which it should be removed.  Wrap patch in a tissue and discard in the trash. Wash hands thoroughly with  soap and water. 2. You may remove the patch earlier than 72 hours if you experience unpleasant side effects which may include dry mouth, dizziness or visual disturbances. 3. Avoid touching the patch. Wash your hands with soap and water after contact with the patch.    Regional Anesthesia Blocks  1. Numbness or the inability to move the "blocked" extremity may last from 3-48 hours after placement. The length of time depends on the medication injected and your individual response to the medication. If the numbness is not going away after 48 hours, call your surgeon.  2. The extremity that is blocked will need to be protected until the numbness is gone and the  Strength has returned. Because you cannot feel it, you will need to take extra care to avoid injury. Because it may be weak, you may have difficulty moving it or using it. You may not know what position it is in without looking at it while the block is in effect.  3. For blocks in the legs and feet, returning to weight bearing and walking needs to be done carefully. You will need to wait until the numbness is entirely gone and the strength has returned. You should be able to move your leg and foot normally before you try and bear weight or walk. You will need someone to be with you when you first try to ensure you do not fall and possibly risk injury.  4. Bruising and tenderness at the needle site are common side effects and   will resolve in a few days.  5. Persistent numbness or new problems with movement should be communicated to the surgeon or the [facility] ([phone])/ [facility] ([phone]).  General expectations: Pain for two to three days. Fingers may become slightly swollen.  Call your doctor if any of the following occur: Severe pain not relieved by pain medication. Elevated temperature. Dressing soaked with blood. Inability to move fingers. White or bluish color to fingers.  
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
## 2019-08-12 NOTE — Anesthesia Preprocedure Evaluation (Addendum)
Anesthesia Evaluation  Patient identified by MRN, date of birth, ID band Patient awake    Reviewed: Allergy & Precautions, NPO status , Patient's Chart, lab work & pertinent test results  History of Anesthesia Complications (+) PONV and history of anesthetic complications  Airway Mallampati: I  TM Distance: >3 FB Neck ROM: Full    Dental no notable dental hx. (+) Chipped, Dental Advisory Given,    Pulmonary neg pulmonary ROS,    Pulmonary exam normal breath sounds clear to auscultation       Cardiovascular negative cardio ROS Normal cardiovascular exam Rhythm:Regular Rate:Normal     Neuro/Psych PSYCHIATRIC DISORDERS Depression negative neurological ROS     GI/Hepatic negative GI ROS, Neg liver ROS,   Endo/Other  Hypothyroidism   Renal/GU negative Renal ROS  negative genitourinary   Musculoskeletal negative musculoskeletal ROS (+)   Abdominal   Peds  Hematology negative hematology ROS (+)   Anesthesia Other Findings   Reproductive/Obstetrics                            Anesthesia Physical Anesthesia Plan  ASA: II  Anesthesia Plan: MAC and Regional   Post-op Pain Management:  Regional for Post-op pain   Induction: Intravenous  PONV Risk Score and Plan: 3 and Propofol infusion, Treatment may vary due to age or medical condition, Midazolam, Ondansetron and Dexamethasone  Airway Management Planned: Natural Airway  Additional Equipment:   Intra-op Plan:   Post-operative Plan:   Informed Consent: I have reviewed the patients History and Physical, chart, labs and discussed the procedure including the risks, benefits and alternatives for the proposed anesthesia with the patient or authorized representative who has indicated his/her understanding and acceptance.     Dental advisory given  Plan Discussed with: CRNA  Anesthesia Plan Comments:         Anesthesia Quick  Evaluation

## 2019-08-13 ENCOUNTER — Ambulatory Visit: Payer: No Typology Code available for payment source | Admitting: Obstetrics and Gynecology

## 2019-08-13 ENCOUNTER — Encounter (HOSPITAL_BASED_OUTPATIENT_CLINIC_OR_DEPARTMENT_OTHER): Payer: Self-pay | Admitting: Orthopedic Surgery

## 2019-08-13 MED FILL — SM LORATA-DINE D 24HR TABLE: 10-240 | 30 days supply | Qty: 30 | Fill #0

## 2019-08-16 ENCOUNTER — Telehealth: Payer: Self-pay | Admitting: Family Medicine
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
## 2019-08-16 NOTE — Telephone Encounter (Signed)
Pt was sent my chart message letting her know she is due for appt, last appt 01/20/2019 and Dr Raliegh Ip wanted her to F/U in 6 months.

## 2019-08-16 NOTE — Op Note (Signed)
Intra-operative fluoroscopic images in the AP, lateral, and oblique views were taken and evaluated by myself.  Reduction and hardware placement were confirmed.  There was no intraarticular penetration of permanent hardware.  

## 2019-08-18 ENCOUNTER — Other Ambulatory Visit (HOSPITAL_COMMUNITY): Payer: Self-pay | Admitting: Orthopedic Surgery

## 2019-08-18 ENCOUNTER — Other Ambulatory Visit: Payer: Self-pay | Admitting: Orthopedic Surgery

## 2019-08-18 DIAGNOSIS — S52501D Unspecified fracture of the lower end of right radius, subsequent encounter for closed fracture with routine healing: Secondary | ICD-10-CM

## 2019-08-18 DIAGNOSIS — S52571A Other intraarticular fracture of lower end of right radius, initial encounter for closed fracture: Secondary | ICD-10-CM

## 2019-08-18 HISTORY — DX: Unspecified fracture of the lower end of right radius, subsequent encounter for closed fracture with routine healing: S52.501D

## 2019-08-20 ENCOUNTER — Other Ambulatory Visit: Payer: Self-pay

## 2019-08-20 ENCOUNTER — Ambulatory Visit (HOSPITAL_COMMUNITY)
Admission: RE | Admit: 2019-08-20 | Discharge: 2019-08-20 | Disposition: A | Discharge status: Home / Self Care | Payer: No Typology Code available for payment source | Source: Ambulatory Visit | Attending: Orthopedic Surgery | Admitting: Orthopedic Surgery

## 2019-08-20 DIAGNOSIS — S52571A Other intraarticular fracture of lower end of right radius, initial encounter for closed fracture: Secondary | ICD-10-CM | POA: Insufficient documentation
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
## 2019-08-26 ENCOUNTER — Encounter: Payer: Self-pay | Admitting: Obstetrics and Gynecology

## 2019-08-26 ENCOUNTER — Ambulatory Visit (INDEPENDENT_AMBULATORY_CARE_PROVIDER_SITE_OTHER): Payer: No Typology Code available for payment source | Admitting: Obstetrics and Gynecology

## 2019-08-26 ENCOUNTER — Other Ambulatory Visit: Payer: Self-pay

## 2019-08-26 VITALS — BP 120/76 | HR 68 | Temp 97.0°F | Ht 65.0 in | Wt 183.4 lb

## 2019-08-26 DIAGNOSIS — N951 Menopausal and female climacteric states: Secondary | ICD-10-CM | POA: Diagnosis not present

## 2019-08-26 DIAGNOSIS — N898 Other specified noninflammatory disorders of vagina: Secondary | ICD-10-CM | POA: Diagnosis not present

## 2019-08-26 DIAGNOSIS — Z01419 Encounter for gynecological examination (general) (routine) without abnormal findings: Secondary | ICD-10-CM | POA: Diagnosis not present

## 2019-08-26 MED ORDER — VALACYCLOVIR HCL 500 MG PO TABS: ORAL_TABLET | ORAL | 2 refills | Status: DC

## 2019-08-26 MED ORDER — MEDROXYPROGESTERONE ACETATE 5 MG PO TABS: ORAL_TABLET | ORAL | 1 refills | Status: DC

## 2019-08-26 MED FILL — VALACYCLOVIR HCL 500 MG TAB: 500 | 30 days supply | Qty: 30 | Fill #0

## 2019-08-26 NOTE — Patient Instructions (Signed)
EXERCISE AND DIET:  We recommended that you start or continue a regular exercise program for good health. Regular exercise means any activity that makes your heart beat faster and makes you sweat.  We recommend exercising at least 30 minutes per day at least 3 days a week, preferably 4 or 5.  We also recommend a diet low in fat and sugar.  Inactivity, poor dietary choices and obesity can cause diabetes, heart attack, stroke, and kidney damage, among others.    ALCOHOL AND SMOKING:  Women should limit their alcohol intake to no more than 7 drinks/beers/glasses of wine (combined, not each!) per week. Moderation of alcohol intake to this level decreases your risk of breast cancer and liver damage. And of course, no recreational drugs are part of a healthy lifestyle.  And absolutely no smoking or even second hand smoke. Most people know smoking can cause heart and lung diseases, but did you know it also contributes to weakening of your bones? Aging of your skin?  Yellowing of your teeth and nails?  CALCIUM AND VITAMIN D:  Adequate intake of calcium and Vitamin D are recommended.  The recommendations for exact amounts of these supplements seem to change often, but generally speaking 1,000 mg of calcium (between diet and supplement) and 800 units of Vitamin D per day seems prudent. Certain women may benefit from higher intake of Vitamin D.  If you are among these women, your doctor will have told you during your visit.    PAP SMEARS:  Pap smears, to check for cervical cancer or precancers,  have traditionally been done yearly, although recent scientific advances have shown that most women can have pap smears less often.  However, every woman still should have a physical exam from her gynecologist every year. It will include a breast check, inspection of the vulva and vagina to check for abnormal growths or skin changes, a visual exam of the cervix, and then an exam to evaluate the size and shape of the uterus and  ovaries.  And after 56 years of age, a rectal exam is indicated to check for rectal cancers. We will also provide age appropriate advice regarding health maintenance, like when you should have certain vaccines, screening for sexually transmitted diseases, bone density testing, colonoscopy, mammograms, etc.   MAMMOGRAMS:  All women over 40 years old should have a yearly mammogram. Many facilities now offer a "3D" mammogram, which may cost around $50 extra out of pocket. If possible,  we recommend you accept the option to have the 3D mammogram performed.  It both reduces the number of women who will be called back for extra views which then turn out to be normal, and it is better than the routine mammogram at detecting truly abnormal areas.    COLON CANCER SCREENING: Now recommend starting at age 45. At this time colonoscopy is not covered for routine screening until 50. There are take home tests that can be done between 45-49.   COLONOSCOPY:  Colonoscopy to screen for colon cancer is recommended for all women at age 50.  We know, you hate the idea of the prep.  We agree, BUT, having colon cancer and not knowing it is worse!!  Colon cancer so often starts as a polyp that can be seen and removed at colonscopy, which can quite literally save your life!  And if your first colonoscopy is normal and you have no family history of colon cancer, most women don't have to have it again for   10 years.  Once every ten years, you can do something that may end up saving your life, right?  We will be happy to help you get it scheduled when you are ready.  Be sure to check your insurance coverage so you understand how much it will cost.  It may be covered as a preventative service at no cost, but you should check your particular policy.      Breast Self-Awareness Breast self-awareness means being familiar with how your breasts look and feel. It involves checking your breasts regularly and reporting any changes to your  health care provider. Practicing breast self-awareness is important. A change in your breasts can be a sign of a serious medical problem. Being familiar with how your breasts look and feel allows you to find any problems early, when treatment is more likely to be successful. All women should practice breast self-awareness, including women who have had breast implants. How to do a breast self-exam One way to learn what is normal for your breasts and whether your breasts are changing is to do a breast self-exam. To do a breast self-exam: Look for Changes  1. Remove all the clothing above your waist. 2. Stand in front of a mirror in a room with good lighting. 3. Put your hands on your hips. 4. Push your hands firmly downward. 5. Compare your breasts in the mirror. Look for differences between them (asymmetry), such as: ? Differences in shape. ? Differences in size. ? Puckers, dips, and bumps in one breast and not the other. 6. Look at each breast for changes in your skin, such as: ? Redness. ? Scaly areas. 7. Look for changes in your nipples, such as: ? Discharge. ? Bleeding. ? Dimpling. ? Redness. ? A change in position. Feel for Changes Carefully feel your breasts for lumps and changes. It is best to do this while lying on your back on the floor and again while sitting or standing in the shower or tub with soapy water on your skin. Feel each breast in the following way:  Place the arm on the side of the breast you are examining above your head.  Feel your breast with the other hand.  Start in the nipple area and make  inch (2 cm) overlapping circles to feel your breast. Use the pads of your three middle fingers to do this. Apply light pressure, then medium pressure, then firm pressure. The light pressure will allow you to feel the tissue closest to the skin. The medium pressure will allow you to feel the tissue that is a little deeper. The firm pressure will allow you to feel the tissue  close to the ribs.  Continue the overlapping circles, moving downward over the breast until you feel your ribs below your breast.  Move one finger-width toward the center of the body. Continue to use the  inch (2 cm) overlapping circles to feel your breast as you move slowly up toward your collarbone.  Continue the up and down exam using all three pressures until you reach your armpit.  Write Down What You Find  Write down what is normal for each breast and any changes that you find. Keep a written record with breast changes or normal findings for each breast. By writing this information down, you do not need to depend only on memory for size, tenderness, or location. Write down where you are in your menstrual cycle, if you are still menstruating. If you are having trouble noticing differences   in your breasts, do not get discouraged. With time you will become more familiar with the variations in your breasts and more comfortable with the exam. How often should I examine my breasts? Examine your breasts every month. If you are breastfeeding, the best time to examine your breasts is after a feeding or after using a breast pump. If you menstruate, the best time to examine your breasts is 5-7 days after your period is over. During your period, your breasts are lumpier, and it may be more difficult to notice changes. When should I see my health care provider? See your health care provider if you notice:  A change in shape or size of your breasts or nipples.  A change in the skin of your breast or nipples, such as a reddened or scaly area.  Unusual discharge from your nipples.  A lump or thick area that was not there before.  Pain in your breasts.  Anything that concerns you.  

## 2019-08-27 ENCOUNTER — Other Ambulatory Visit: Payer: Self-pay | Admitting: *Deleted

## 2019-08-27 LAB — VAGINITIS/VAGINOSIS, DNA PROBE
Candida Species: NEGATIVE
Gardnerella vaginalis: POSITIVE — AB
Trichomonas vaginosis: NEGATIVE

## 2019-08-27 MED ORDER — METRONIDAZOLE 500 MG PO TABS: 500.0000 mg | ORAL_TABLET | Freq: Two times a day (BID) | ORAL | 0 refills | Status: AC

## 2019-08-27 MED FILL — metroNIDAZOLE 500 MG TABS: 500 | 7 days supply | Qty: 14 | Fill #0

## 2019-08-31 MED FILL — VENLAFAXINE HCL ER 75 MG CA: 75 | 90 days supply | Qty: 90 | Fill #1

## 2019-08-31 MED FILL — SYNTHROID 100 MCG TABLET: 100 | 90 days supply | Qty: 96 | Fill #1

## 2019-10-25 ENCOUNTER — Other Ambulatory Visit: Payer: Self-pay | Admitting: Orthopedic Surgery

## 2019-10-25 ENCOUNTER — Other Ambulatory Visit (HOSPITAL_COMMUNITY): Payer: Self-pay | Admitting: Orthopedic Surgery

## 2019-10-25 DIAGNOSIS — M25511 Pain in right shoulder: Secondary | ICD-10-CM

## 2019-10-28 MED FILL — DICLOFENAC SOD ER 100 MG TA: 100 | 90 days supply | Qty: 90 | Fill #2

## 2019-11-01 ENCOUNTER — Ambulatory Visit (HOSPITAL_COMMUNITY)
Admission: RE | Admit: 2019-11-01 | Discharge: 2019-11-01 | Disposition: A | Discharge status: Home / Self Care | Payer: No Typology Code available for payment source | Source: Ambulatory Visit | Attending: Orthopedic Surgery | Admitting: Orthopedic Surgery

## 2019-11-01 ENCOUNTER — Other Ambulatory Visit: Payer: Self-pay

## 2019-11-01 DIAGNOSIS — M25511 Pain in right shoulder: Secondary | ICD-10-CM | POA: Diagnosis present

## 2019-11-29 MED FILL — VENLAFAXINE HCL ER 75 MG CA: 75 | 90 days supply | Qty: 90 | Fill #2

## 2019-12-15 MED FILL — SYNTHROID 100 MCG TABLET: 100 | 90 days supply | Qty: 96 | Fill #2

## 2020-01-04 ENCOUNTER — Telehealth: Payer: Self-pay | Admitting: Obstetrics and Gynecology

## 2020-01-04 NOTE — Telephone Encounter (Signed)
Staff message from Debbora Presto.- Ms Craigie was referred to urology by [place] and since she has the focus plan, insurance is requiring the referral in order to file the claim. They have not received it. Patient would like to know if we can re-send it or send the referral to her so she can send it to insurance herself.   Thanks!  Arvilla Meres called [facility] and spoke with Randa Evens in regards to this referral. Per rep it is the patient's responsibility to call or go online to report referrals to specialists. I did give the appointment date and providers information to the rep for the referral documentation. The patient did see Dr. Bertis Ruddy on 08/16/19. Per rep there is no other information needed from [facility]. Reference number for case is [id number] and the reference number for our call is [id number].  I have spoken with the patient and conveyed the information. Patient states she did call her PCP to document the referral for her. She will follow up with her PCP to see if this documentation was completed for her. Patient was very appreciative and stated she would call back if she needs anything else.

## 2020-01-10 ENCOUNTER — Encounter (HOSPITAL_BASED_OUTPATIENT_CLINIC_OR_DEPARTMENT_OTHER): Payer: Self-pay | Admitting: Orthopedic Surgery

## 2020-01-10 ENCOUNTER — Other Ambulatory Visit: Payer: Self-pay | Admitting: Orthopedic Surgery

## 2020-01-11 ENCOUNTER — Other Ambulatory Visit: Payer: Self-pay

## 2020-01-11 ENCOUNTER — Encounter (HOSPITAL_BASED_OUTPATIENT_CLINIC_OR_DEPARTMENT_OTHER): Payer: Self-pay | Admitting: Orthopedic Surgery

## 2020-01-13 ENCOUNTER — Other Ambulatory Visit (HOSPITAL_COMMUNITY): Payer: No Typology Code available for payment source

## 2020-01-13 ENCOUNTER — Inpatient Hospital Stay (HOSPITAL_COMMUNITY): Admission: RE | Admit: 2020-01-13 | Payer: No Typology Code available for payment source | Source: Ambulatory Visit

## 2020-01-14 ENCOUNTER — Other Ambulatory Visit (HOSPITAL_COMMUNITY)
Admission: RE | Admit: 2020-01-14 | Discharge: 2020-01-14 | Disposition: A | Discharge status: Home / Self Care | Payer: No Typology Code available for payment source | Source: Ambulatory Visit | Attending: Orthopedic Surgery | Admitting: Orthopedic Surgery

## 2020-01-14 DIAGNOSIS — Z20822 Contact with and (suspected) exposure to covid-19: Secondary | ICD-10-CM | POA: Diagnosis not present

## 2020-01-14 DIAGNOSIS — Z01812 Encounter for preprocedural laboratory examination: Secondary | ICD-10-CM | POA: Insufficient documentation

## 2020-01-14 LAB — SARS CORONAVIRUS 2 (TAT 6-24 HRS): SARS Coronavirus 2: NEGATIVE

## 2020-01-17 ENCOUNTER — Encounter (HOSPITAL_BASED_OUTPATIENT_CLINIC_OR_DEPARTMENT_OTHER): Payer: Self-pay | Admitting: Orthopedic Surgery

## 2020-01-17 ENCOUNTER — Ambulatory Visit (HOSPITAL_BASED_OUTPATIENT_CLINIC_OR_DEPARTMENT_OTHER): Payer: No Typology Code available for payment source | Admitting: Anesthesiology

## 2020-01-17 ENCOUNTER — Encounter (HOSPITAL_BASED_OUTPATIENT_CLINIC_OR_DEPARTMENT_OTHER)
Admission: RE | Disposition: A | Discharge status: Home / Self Care | Payer: Self-pay | Source: Home / Self Care | Attending: Orthopedic Surgery

## 2020-01-17 ENCOUNTER — Ambulatory Visit (HOSPITAL_BASED_OUTPATIENT_CLINIC_OR_DEPARTMENT_OTHER)
Admission: RE | Admit: 2020-01-17 | Discharge: 2020-01-17 | Disposition: A | Discharge status: Home / Self Care | Payer: No Typology Code available for payment source | Attending: Orthopedic Surgery | Admitting: Orthopedic Surgery

## 2020-01-17 ENCOUNTER — Other Ambulatory Visit: Payer: Self-pay

## 2020-01-17 DIAGNOSIS — Z79890 Hormone replacement therapy (postmenopausal): Secondary | ICD-10-CM | POA: Insufficient documentation

## 2020-01-17 DIAGNOSIS — E039 Hypothyroidism, unspecified: Secondary | ICD-10-CM | POA: Insufficient documentation

## 2020-01-17 DIAGNOSIS — M7501 Adhesive capsulitis of right shoulder: Secondary | ICD-10-CM | POA: Diagnosis not present

## 2020-01-17 DIAGNOSIS — M7541 Impingement syndrome of right shoulder: Secondary | ICD-10-CM | POA: Diagnosis not present

## 2020-01-17 DIAGNOSIS — M75111 Incomplete rotator cuff tear or rupture of right shoulder, not specified as traumatic: Secondary | ICD-10-CM | POA: Diagnosis present

## 2020-01-17 DIAGNOSIS — Z79899 Other long term (current) drug therapy: Secondary | ICD-10-CM | POA: Insufficient documentation

## 2020-01-17 HISTORY — PX: SHOULDER INJECTION: SHX5048

## 2020-01-17 HISTORY — DX: Impingement syndrome of right shoulder: M75.41

## 2020-01-17 HISTORY — PX: EXAM UNDER ANESTHESIA WITH MANIPULATION OF SHOULDER: SHX5817

## 2020-01-17 SURGERY — EXAM UNDER ANESTHESIA, SHOULDER, WITH MANIPULATION
Anesthesia: General | Site: Shoulder | Laterality: Right

## 2020-01-17 MED ORDER — CEFAZOLIN SODIUM-DEXTROSE 2-4 GM/100ML-% IV SOLN
2.0000 g | INTRAVENOUS | Status: AC
  Administered 2020-01-17: 14:00:00 2 g via INTRAVENOUS

## 2020-01-17 MED ORDER — EPHEDRINE SULFATE-NACL 50-0.9 MG/10ML-% IV SOSY
PREFILLED_SYRINGE | INTRAVENOUS | Status: DC | PRN
  Administered 2020-01-17 (×3): 10 mg via INTRAVENOUS

## 2020-01-17 MED ORDER — SUGAMMADEX SODIUM 200 MG/2ML IV SOLN
INTRAVENOUS | Status: DC | PRN
  Administered 2020-01-17: 170 mg via INTRAVENOUS

## 2020-01-17 MED ORDER — ONDANSETRON HCL 4 MG/2ML IJ SOLN
INTRAMUSCULAR | Status: DC | PRN
  Administered 2020-01-17: 4 mg via INTRAVENOUS

## 2020-01-17 MED ORDER — OXYCODONE HCL 5 MG/5ML PO SOLN: 5.0000 mg | Freq: Once | ORAL | Status: DC | PRN

## 2020-01-17 MED ORDER — ROCURONIUM BROMIDE 10 MG/ML (PF) SYRINGE
PREFILLED_SYRINGE | INTRAVENOUS | Status: DC | PRN
  Administered 2020-01-17: 50 mg via INTRAVENOUS

## 2020-01-17 MED ORDER — DEXAMETHASONE SODIUM PHOSPHATE 10 MG/ML IJ SOLN
INTRAMUSCULAR | Status: DC | PRN
  Administered 2020-01-17: 4 mg via INTRAVENOUS

## 2020-01-17 MED ORDER — PROPOFOL 10 MG/ML IV BOLUS
INTRAVENOUS | Status: DC | PRN
  Administered 2020-01-17: 200 mg via INTRAVENOUS

## 2020-01-17 MED ORDER — TRIAMCINOLONE ACETONIDE 40 MG/ML IJ SUSP
INTRAMUSCULAR | Status: DC | PRN
  Administered 2020-01-17: 40 mg via INTRA_ARTICULAR

## 2020-01-17 MED ORDER — FENTANYL CITRATE (PF) 100 MCG/2ML IJ SOLN
INTRAMUSCULAR | Status: AC
  Filled 2020-01-17: qty 2

## 2020-01-17 MED ORDER — BUPIVACAINE HCL (PF) 0.5 % IJ SOLN
INTRAMUSCULAR | Status: DC | PRN
  Administered 2020-01-17: 15 mL via PERINEURAL

## 2020-01-17 MED ORDER — MIDAZOLAM HCL 2 MG/2ML IJ SOLN
INTRAMUSCULAR | Status: AC
  Filled 2020-01-17: qty 2

## 2020-01-17 MED ORDER — TIZANIDINE HCL 4 MG PO TABS: 4.0000 mg | ORAL_TABLET | Freq: Three times a day (TID) | ORAL | 1 refills | Status: DC | PRN

## 2020-01-17 MED ORDER — LACTATED RINGERS IV SOLN: INTRAVENOUS | Status: DC

## 2020-01-17 MED ORDER — BUPIVACAINE HCL (PF) 0.5 % IJ SOLN
INTRAMUSCULAR | Status: AC
  Filled 2020-01-17: qty 30

## 2020-01-17 MED ORDER — FENTANYL CITRATE (PF) 100 MCG/2ML IJ SOLN: 25.0000 ug | INTRAMUSCULAR | Status: DC | PRN

## 2020-01-17 MED ORDER — SODIUM CHLORIDE 0.9 % IR SOLN
Status: DC | PRN
  Administered 2020-01-17: 3000 mL

## 2020-01-17 MED ORDER — PROPOFOL 500 MG/50ML IV EMUL
INTRAVENOUS | Status: DC | PRN
  Administered 2020-01-17: 25 ug/kg/min via INTRAVENOUS

## 2020-01-17 MED ORDER — PROMETHAZINE HCL 25 MG/ML IJ SOLN: 6.2500 mg | INTRAMUSCULAR | Status: DC | PRN

## 2020-01-17 MED ORDER — PHENYLEPHRINE 40 MCG/ML (10ML) SYRINGE FOR IV PUSH (FOR BLOOD PRESSURE SUPPORT)
PREFILLED_SYRINGE | INTRAVENOUS | Status: DC | PRN
  Administered 2020-01-17 (×2): 80 ug via INTRAVENOUS

## 2020-01-17 MED ORDER — BUPIVACAINE LIPOSOME 1.3 % IJ SUSP
INTRAMUSCULAR | Status: DC | PRN
  Administered 2020-01-17: 10 mL via PERINEURAL

## 2020-01-17 MED ORDER — CHLORHEXIDINE GLUCONATE 4 % EX LIQD: 60.0000 mL | Freq: Once | CUTANEOUS | Status: DC

## 2020-01-17 MED ORDER — OXYCODONE-ACETAMINOPHEN 5-325 MG PO TABS: ORAL_TABLET | ORAL | 0 refills | Status: DC

## 2020-01-17 MED ORDER — LIDOCAINE HCL (CARDIAC) PF 100 MG/5ML IV SOSY
PREFILLED_SYRINGE | INTRAVENOUS | Status: DC | PRN
  Administered 2020-01-17: 40 mg via INTRAVENOUS

## 2020-01-17 MED ORDER — FENTANYL CITRATE (PF) 100 MCG/2ML IJ SOLN
50.0000 ug | INTRAMUSCULAR | Status: DC | PRN
  Administered 2020-01-17: 50 ug via INTRAVENOUS

## 2020-01-17 MED ORDER — CEFAZOLIN SODIUM-DEXTROSE 2-4 GM/100ML-% IV SOLN
INTRAVENOUS | Status: AC
  Filled 2020-01-17: qty 100

## 2020-01-17 MED ORDER — BUPIVACAINE HCL (PF) 0.25 % IJ SOLN
INTRAMUSCULAR | Status: AC
  Filled 2020-01-17: qty 30

## 2020-01-17 MED ORDER — MIDAZOLAM HCL 2 MG/2ML IJ SOLN
1.0000 mg | INTRAMUSCULAR | Status: DC | PRN
  Administered 2020-01-17: 2 mg via INTRAVENOUS

## 2020-01-17 MED ORDER — BUPIVACAINE HCL (PF) 0.25 % IJ SOLN
INTRAMUSCULAR | Status: DC | PRN
  Administered 2020-01-17: 7 mL via INTRA_ARTICULAR

## 2020-01-17 MED ORDER — OXYCODONE HCL 5 MG PO TABS: 5.0000 mg | ORAL_TABLET | Freq: Once | ORAL | Status: DC | PRN

## 2020-01-17 MED FILL — tiZANidine HCL 4 MG TABS: 4 | 10 days supply | Qty: 30 | Fill #0

## 2020-01-17 MED FILL — OXYCODONE-ACETAMINOPHEN 5-3: 5-325 | 5 days supply | Qty: 30 | Fill #0

## 2020-01-17 SURGICAL SUPPLY — 74 items
BENZOIN TINCTURE PRP APPL 2/3 (GAUZE/BANDAGES/DRESSINGS) IMPLANT
BLADE CLIPPER SURG (BLADE) IMPLANT
BURR OVAL 8 FLU 4.0X13 (MISCELLANEOUS) ×3 IMPLANT
CANNULA 5.75X7 CRYSTAL CLEAR (CANNULA) ×3 IMPLANT
CANNULA TWIST IN 8.25X7CM (CANNULA) IMPLANT
CHLORAPREP W/TINT 26 (MISCELLANEOUS) ×3 IMPLANT
COVER WAND RF STERILE (DRAPES) IMPLANT
CUTTER BONE 4.0MM X 13CM (MISCELLANEOUS) ×3 IMPLANT
DECANTER SPIKE VIAL GLASS SM (MISCELLANEOUS) IMPLANT
DERMABOND ADVANCED (GAUZE/BANDAGES/DRESSINGS)
DERMABOND ADVANCED .7 DNX12 (GAUZE/BANDAGES/DRESSINGS) IMPLANT
DRAPE IMP U-DRAPE 54X76 (DRAPES) ×3 IMPLANT
DRAPE INCISE IOBAN 66X45 STRL (DRAPES) ×3 IMPLANT
DRAPE STERI 35X30 U-POUCH (DRAPES) ×3 IMPLANT
DRAPE SURG 17X23 STRL (DRAPES) ×3 IMPLANT
DRAPE U-SHAPE 47X51 STRL (DRAPES) ×3 IMPLANT
DRAPE U-SHAPE 76X120 STRL (DRAPES) ×6 IMPLANT
DRSG PAD ABDOMINAL 8X10 ST (GAUZE/BANDAGES/DRESSINGS) ×3 IMPLANT
ELECT BLADE 4.0 EZ CLEAN MEGAD (MISCELLANEOUS)
ELECT REM PT RETURN 9FT ADLT (ELECTROSURGICAL) ×3
ELECTRODE BLDE 4.0 EZ CLN MEGD (MISCELLANEOUS) IMPLANT
ELECTRODE REM PT RTRN 9FT ADLT (ELECTROSURGICAL) ×2 IMPLANT
GAUZE 4X4 16PLY RFD (DISPOSABLE) IMPLANT
GAUZE SPONGE 4X4 12PLY STRL (GAUZE/BANDAGES/DRESSINGS) ×3 IMPLANT
GAUZE XEROFORM 1X8 LF (GAUZE/BANDAGES/DRESSINGS) ×3 IMPLANT
GLOVE BIO SURGEON STRL SZ7 (GLOVE) ×6 IMPLANT
GLOVE BIO SURGEON STRL SZ7.5 (GLOVE) ×3 IMPLANT
GLOVE BIOGEL PI IND STRL 7.0 (GLOVE) ×4 IMPLANT
GLOVE BIOGEL PI IND STRL 8 (GLOVE) ×2 IMPLANT
GLOVE BIOGEL PI INDICATOR 7.0 (GLOVE) ×2
GLOVE BIOGEL PI INDICATOR 8 (GLOVE) ×1
GLOVE ECLIPSE 6.5 STRL STRAW (GLOVE) ×3 IMPLANT
GOWN STRL REUS W/ TWL LRG LVL3 (GOWN DISPOSABLE) ×4 IMPLANT
GOWN STRL REUS W/TWL LRG LVL3 (GOWN DISPOSABLE) ×6
KIT STR SPEAR 1.8 FBRTK DISP (KITS) IMPLANT
LASSO CRESCENT QUICKPASS (SUTURE) IMPLANT
MANIFOLD NEPTUNE II (INSTRUMENTS) ×3 IMPLANT
NEEDLE SCORPION MULTI FIRE (NEEDLE) IMPLANT
NS IRRIG 1000ML POUR BTL (IV SOLUTION) IMPLANT
PACK ARTHROSCOPY DSU (CUSTOM PROCEDURE TRAY) ×3 IMPLANT
PACK BASIN DAY SURGERY FS (CUSTOM PROCEDURE TRAY) ×3 IMPLANT
PENCIL SMOKE EVACUATOR (MISCELLANEOUS) IMPLANT
PROBE BIPOLAR ATHRO 135MM 90D (MISCELLANEOUS) ×3 IMPLANT
RESTRAINT HEAD UNIVERSAL NS (MISCELLANEOUS) ×3 IMPLANT
SLEEVE SCD COMPRESS KNEE MED (MISCELLANEOUS) ×3 IMPLANT
SLING ARM FOAM STRAP LRG (SOFTGOODS) ×3 IMPLANT
SPONGE LAP 4X18 RFD (DISPOSABLE) IMPLANT
STRIP CLOSURE SKIN 1/2X4 (GAUZE/BANDAGES/DRESSINGS) IMPLANT
SUCTION FRAZIER HANDLE 10FR (MISCELLANEOUS)
SUCTION TUBE FRAZIER 10FR DISP (MISCELLANEOUS) IMPLANT
SUPPORT WRAP ARM LG (MISCELLANEOUS) ×3 IMPLANT
SUT ETHILON 3 0 PS 1 (SUTURE) ×3 IMPLANT
SUT ETHILON 4 0 PS 2 18 (SUTURE) IMPLANT
SUT FIBERWIRE #2 38 T-5 BLUE (SUTURE)
SUT MNCRL AB 3-0 PS2 18 (SUTURE) IMPLANT
SUT MNCRL AB 4-0 PS2 18 (SUTURE) IMPLANT
SUT PDS AB 0 CT 36 (SUTURE) IMPLANT
SUT PDS AB 1 CT  36 (SUTURE)
SUT PDS AB 1 CT 36 (SUTURE) IMPLANT
SUT TIGER TAPE 7 IN WHITE (SUTURE) IMPLANT
SUT VIC AB 0 CT1 27 (SUTURE)
SUT VIC AB 0 CT1 27XBRD ANBCTR (SUTURE) IMPLANT
SUT VIC AB 3-0 SH 27 (SUTURE)
SUT VIC AB 3-0 SH 27X BRD (SUTURE) IMPLANT
SUTURE FIBERWR #2 38 T-5 BLUE (SUTURE) IMPLANT
SUTURE TAPE 1.3 40 TPR END (SUTURE) IMPLANT
SUTURE TAPE 1.3 FIBERLOP 20 ST (SUTURE) IMPLANT
SUTURETAPE 1.3 40 TPR END (SUTURE)
SUTURETAPE 1.3 FIBERLOOP 20 ST (SUTURE)
TAPE FIBER 2MM 7IN #2 BLUE (SUTURE) IMPLANT
TOWEL GREEN STERILE FF (TOWEL DISPOSABLE) ×3 IMPLANT
TUBE CONNECTING 20X1/4 (TUBING) ×3 IMPLANT
TUBING ARTHROSCOPY IRRIG 16FT (MISCELLANEOUS) ×3 IMPLANT
WATER STERILE IRR 1000ML POUR (IV SOLUTION) IMPLANT
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
## 2020-01-17 NOTE — Transfer of Care (Signed)
Immediate Anesthesia Transfer of Care Note  Patient: Molly Park  Procedure(s) Performed: Right shoulder manipulation under anesthesia (Right Shoulder)  Patient Location: PACU  Anesthesia Type:GA combined with regional for post-op pain  Level of Consciousness: awake, alert , oriented, drowsy and patient cooperative  Airway & Oxygen Therapy: Patient Spontanous Breathing and Patient connected to nasal cannula oxygen  Post-op Assessment: Report given to RN and Post -op Vital signs reviewed and stable  Post vital signs: Reviewed and stable  Last Vitals:  Vitals Value Taken Time  BP 149/85 01/17/20 1500  Temp    Pulse 81 01/17/20 1502  Resp 20 01/17/20 1502  SpO2 100 % 01/17/20 1502  Vitals shown include unvalidated device data.  Last Pain:  Vitals:   01/17/20 1112  TempSrc: Oral  PainSc: 0-No pain      Patients Stated Pain Goal: 8 (01/17/20 1112)  Complications: No apparent anesthesia complications

## 2020-01-17 NOTE — Anesthesia Postprocedure Evaluation (Signed)
Anesthesia Post Note  Patient: Molly Park  Procedure(s) Performed: Right shoulder manipulation under anesthesia (Right Shoulder) SHOULDER STEROID INJECTION (Right Shoulder) SHOULDER ARTHROSCOPY WITH DEBRIDEMENT AND SUBACROMIAL DECOMPRESSION (Right Shoulder)     Patient location during evaluation: PACU Anesthesia Type: General Level of consciousness: awake and alert Pain management: pain level controlled Vital Signs Assessment: post-procedure vital signs reviewed and stable Respiratory status: spontaneous breathing, nonlabored ventilation and respiratory function stable Cardiovascular status: blood pressure returned to baseline and stable Postop Assessment: no apparent nausea or vomiting Anesthetic complications: no    Last Vitals:  Vitals:   01/17/20 1500 01/17/20 1515  BP: (!) 149/85 (!) 140/92  Pulse: 84 81  Resp: 11 (!) 23  Temp: (!) 36.4 C   SpO2: 100% 98%    Last Pain:  Vitals:   01/17/20 1515  TempSrc:   PainSc: 0-No pain                 Beryle Lathe

## 2020-01-17 NOTE — Progress Notes (Signed)
Assisted Dr. Brock with right, ultrasound guided, interscalene  block. Side rails up, monitors on throughout procedure. See vital signs in flow sheet. Tolerated Procedure well.  

## 2020-01-17 NOTE — Anesthesia Procedure Notes (Signed)
Procedure Name: Intubation Date/Time: 01/17/2020 2:01 PM Performed by: Raenette Rover, CRNA Pre-anesthesia Checklist: Patient identified, Emergency Drugs available, Suction available and Patient being monitored Patient Re-evaluated:Patient Re-evaluated prior to induction Oxygen Delivery Method: Circle system utilized Preoxygenation: Pre-oxygenation with 100% oxygen Induction Type: IV induction Ventilation: Mask ventilation without difficulty Laryngoscope Size: Mac and 3 Grade View: Grade I Tube type: Oral Tube size: 7.0 mm Number of attempts: 1 Airway Equipment and Method: Stylet Placement Confirmation: ETT inserted through vocal cords under direct vision,  positive ETCO2 and breath sounds checked- equal and bilateral Secured at: 22 cm Tube secured with: Tape Dental Injury: Teeth and Oropharynx as per pre-operative assessment

## 2020-01-17 NOTE — Discharge Instructions (Signed)
Discharge Instructions after Arthroscopic Shoulder Surgery   A sling has been provided for you. You may remove the sling after 72 hours. The sling may be worn for your protection, if you are in a crowd.  Use ice on the shoulder intermittently over the first 48 hours after surgery.  Pain medication has been prescribed for you.  Use your medication liberally over the first 48 hours, and then begin to taper your use. You may take Extra Strength Tylenol or Tylenol only in place of the pain pills. DO NOT take ANY nonsteroidal anti-inflammatory pain medications: Advil, Motrin, Ibuprofen, Aleve, Naproxen, or Naprosyn.  You may remove your dressing after two days.  You may shower 5 days after surgery. The incision CANNOT get wet prior to 5 days. Simply allow the water to wash over the site and then pat dry. Do not rub the incision. Make sure your axilla (armpit) is completely dry after showering.  Take one aspirin a day for 2 weeks after surgery, unless you have an aspirin sensitivity/allergy or asthma.  Three to 5 times each day you should perform assisted overhead reaching and external rotation (outward turning) exercises with the operative arm. Both exercises should be done with the non-operative arm used as the "therapist arm" while the operative arm remains relaxed. Ten of each exercise should be done three to five times each day.    Overhead reach is helping to lift your stiff arm up as high as it will go. To stretch your overhead reach, lie flat on your back, relax, and grasp the wrist of the tight shoulder with your opposite hand. Using the power in your opposite arm, bring the stiff arm up as far as it is comfortable. Start holding it for ten seconds and then work up to where you can hold it for a count of 30. Breathe slowly and deeply while the arm is moved. Repeat this stretch ten times, trying to help the arm up a little higher each time.       External rotation is turning the arm out to  the side while your elbow stays close to your body. External rotation is best stretched while you are lying on your back. Hold a cane, yardstick, broom handle, or dowel in both hands. Bend both elbows to a right angle. Use steady, gentle force from your normal arm to rotate the hand of the stiff shoulder out away from your body. Continue the rotation as far as it will go comfortably, holding it there for a count of 10. Repeat this exercise ten times.     Please call [phone] during normal business hours or [phone] after hours for any problems. Including the following:  - excessive redness of the incisions - drainage for more than 4 days - fever of more than 101.5 F  *Please note that pain medications will not be refilled after hours or on weekends.   Post Anesthesia Home Care Instructions  Activity: Get plenty of rest for the remainder of the day. A responsible individual must stay with you for 24 hours following the procedure.  For the next 24 hours, DO NOT: -Drive a car -Operate machinery -Drink alcoholic beverages -Take any medication unless instructed by your physician -Make any legal decisions or sign important papers.  Meals: Start with liquid foods such as gelatin or soup. Progress to regular foods as tolerated. Avoid greasy, spicy, heavy foods. If nausea and/or vomiting occur, drink only clear liquids until the nausea and/or vomiting subsides. Call your   physician if vomiting continues.  Special Instructions/Symptoms: Your throat may feel dry or sore from the anesthesia or the breathing tube placed in your throat during surgery. If this causes discomfort, gargle with warm salt water. The discomfort should disappear within 24 hours.  If you had a scopolamine patch placed behind your ear for the management of post- operative nausea and/or vomiting:  1. The medication in the patch is effective for 72 hours, after which it should be removed.  Wrap patch in a tissue and  discard in the trash. Wash hands thoroughly with soap and water. 2. You may remove the patch earlier than 72 hours if you experience unpleasant side effects which may include dry mouth, dizziness or visual disturbances. 3. Avoid touching the patch. Wash your hands with soap and water after contact with the patch.  Regional Anesthesia Blocks  1. Numbness or the inability to move the "blocked" extremity may last from 3-48 hours after placement. The length of time depends on the medication injected and your individual response to the medication. If the numbness is not going away after 48 hours, call your surgeon.  2. The extremity that is blocked will need to be protected until the numbness is gone and the  Strength has returned. Because you cannot feel it, you will need to take extra care to avoid injury. Because it may be weak, you may have difficulty moving it or using it. You may not know what position it is in without looking at it while the block is in effect.  3. For blocks in the legs and feet, returning to weight bearing and walking needs to be done carefully. You will need to wait until the numbness is entirely gone and the strength has returned. You should be able to move your leg and foot normally before you try and bear weight or walk. You will need someone to be with you when you first try to ensure you do not fall and possibly risk injury.  4. Bruising and tenderness at the needle site are common side effects and will resolve in a few days.  5. Persistent numbness or new problems with movement should be communicated to the surgeon or the [facility] [phone] [facility] [phone]).    Information for Discharge Teaching: EXPAREL (bupivacaine liposome injectable suspension)   Your surgeon or anesthesiologist gave you EXPAREL(bupivacaine) to help control your pain after surgery.   EXPAREL is a local anesthetic that provides pain relief by numbing the  tissue around the surgical site.  EXPAREL is designed to release pain medication over time and can control pain for up to 72 hours.  Depending on how you respond to EXPAREL, you may require less pain medication during your recovery.  Possible side effects:  Temporary loss of sensation or ability to move in the area where bupivacaine was injected.  Nausea, vomiting, constipation  Rarely, numbness and tingling in your mouth or lips, lightheadedness, or anxiety may occur.  Call your doctor right away if you think you may be experiencing any of these sensations, or if you have other questions regarding possible side effects.  Follow all other discharge instructions given to you by your surgeon or nurse. Eat a healthy diet and drink plenty of water or other fluids.  If you return to the hospital for any reason within 96 hours following the administration of EXPAREL, it is important for health care providers to know that you have received this anesthetic. A teal colored  band has been placed on your arm with the date, time and amount of EXPAREL you have received in order to alert and inform your health care providers. Please leave this armband in place for the full 96 hours following administration, and then you may remove the band.  Call your surgeon if you experience:   1.  Fever over 101.0. 2.  Inability to urinate. 3.  Nausea and/or vomiting. 4.  Extreme swelling or bruising at the surgical site. 5.  Continued bleeding from the incision. 6.  Increased pain, redness or drainage from the incision. 7.  Problems related to your pain medication. 8.  Any problems and/or concerns
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
## 2020-01-17 NOTE — Anesthesia Procedure Notes (Signed)
Anesthesia Regional Block: Interscalene brachial plexus block   Pre-Anesthetic Checklist: ,, timeout performed, Correct Patient, Correct Site, Correct Laterality, Correct Procedure, Correct Position, site marked, Risks and benefits discussed,  Surgical consent,  Pre-op evaluation,  At surgeon's request and post-op pain management  Laterality: Right  Prep: chloraprep       Needles:  Injection technique: Single-shot  Needle Type: Echogenic Needle     Needle Length: 5cm  Needle Gauge: 21     Additional Needles:   Narrative:  Start time: 01/17/2020 12:38 PM End time: 01/17/2020 12:42 PM Injection made incrementally with aspirations every 5 mL.  Performed by: Personally  Anesthesiologist: Beryle Lathe, MD  Additional Notes: No pain on injection. No increased resistance to injection. Injection made in 5cc increments. Good needle visualization. Patient tolerated the procedure well.

## 2020-01-17 NOTE — Anesthesia Preprocedure Evaluation (Addendum)
Anesthesia Evaluation  Patient identified by MRN, date of birth, ID band Patient awake    Reviewed: Allergy & Precautions, NPO status , Patient's Chart, lab work & pertinent test results  History of Anesthesia Complications (+) PONV and history of anesthetic complications  Airway Mallampati: II  TM Distance: >3 FB Neck ROM: Full    Dental  (+) Dental Advisory Given, Missing   Pulmonary neg pulmonary ROS,    Pulmonary exam normal        Cardiovascular negative cardio ROS Normal cardiovascular exam     Neuro/Psych negative neurological ROS  negative psych ROS   GI/Hepatic negative GI ROS, Neg liver ROS,   Endo/Other  Hypothyroidism   Renal/GU negative Renal ROS     Musculoskeletal negative musculoskeletal ROS (+)   Abdominal   Peds  Hematology negative hematology ROS (+)   Anesthesia Other Findings HSV Hx lower extremity edema b/l Covid neg 01/14/20   Reproductive/Obstetrics  S/p tubal ligation                             Anesthesia Physical Anesthesia Plan  ASA: II  Anesthesia Plan: General   Post-op Pain Management:  Regional for Post-op pain   Induction: Intravenous  PONV Risk Score and Plan: 4 or greater and Treatment may vary due to age or medical condition, Ondansetron, Dexamethasone and Midazolam  Airway Management Planned: Oral ETT  Additional Equipment: None  Intra-op Plan:   Post-operative Plan: Extubation in OR  Informed Consent: I have reviewed the patients History and Physical, chart, labs and discussed the procedure including the risks, benefits and alternatives for the proposed anesthesia with the patient or authorized representative who has indicated his/her understanding and acceptance.     Dental advisory given  Plan Discussed with: CRNA and Anesthesiologist  Anesthesia Plan Comments:        Anesthesia Quick Evaluation

## 2020-01-18 ENCOUNTER — Encounter: Payer: Self-pay | Admitting: *Deleted

## 2020-01-26 ENCOUNTER — Encounter: Payer: Self-pay | Admitting: Family Medicine

## 2020-01-26 ENCOUNTER — Telehealth: Payer: Self-pay | Admitting: Family Medicine

## 2020-01-26 ENCOUNTER — Other Ambulatory Visit: Payer: Self-pay

## 2020-01-26 ENCOUNTER — Other Ambulatory Visit: Payer: Self-pay | Admitting: Family Medicine

## 2020-01-26 ENCOUNTER — Ambulatory Visit (INDEPENDENT_AMBULATORY_CARE_PROVIDER_SITE_OTHER): Payer: No Typology Code available for payment source | Admitting: Family Medicine

## 2020-01-26 VITALS — BP 115/76 | HR 84 | Temp 97.7°F | Resp 16 | Ht 65.0 in | Wt 174.5 lb

## 2020-01-26 DIAGNOSIS — R232 Flushing: Secondary | ICD-10-CM | POA: Diagnosis not present

## 2020-01-26 DIAGNOSIS — E559 Vitamin D deficiency, unspecified: Secondary | ICD-10-CM | POA: Diagnosis not present

## 2020-01-26 DIAGNOSIS — Z0000 Encounter for general adult medical examination without abnormal findings: Secondary | ICD-10-CM | POA: Diagnosis not present

## 2020-01-26 DIAGNOSIS — Z5320 Procedure and treatment not carried out because of patient's decision for unspecified reasons: Secondary | ICD-10-CM

## 2020-01-26 DIAGNOSIS — Z79899 Other long term (current) drug therapy: Secondary | ICD-10-CM | POA: Diagnosis not present

## 2020-01-26 DIAGNOSIS — E663 Overweight: Secondary | ICD-10-CM | POA: Diagnosis not present

## 2020-01-26 DIAGNOSIS — Z1231 Encounter for screening mammogram for malignant neoplasm of breast: Secondary | ICD-10-CM

## 2020-01-26 DIAGNOSIS — E039 Hypothyroidism, unspecified: Secondary | ICD-10-CM | POA: Diagnosis not present

## 2020-01-26 DIAGNOSIS — R7309 Other abnormal glucose: Secondary | ICD-10-CM | POA: Diagnosis not present

## 2020-01-26 DIAGNOSIS — E782 Mixed hyperlipidemia: Secondary | ICD-10-CM | POA: Diagnosis not present

## 2020-01-26 LAB — LIPID PANEL
Cholesterol: 244 mg/dL — ABNORMAL HIGH (ref 0–200)
HDL: 73.7 mg/dL (ref >39.00)
LDL Cholesterol: 153 mg/dL — ABNORMAL HIGH (ref 0–99)
NonHDL: 170.27
Total CHOL/HDL Ratio: 3
Triglycerides: 87 mg/dL (ref 0.0–149.0)
VLDL: 17.4 mg/dL (ref 0.0–40.0)

## 2020-01-26 LAB — CBC
HCT: 37.4 % (ref 36.0–46.0)
Hemoglobin: 12.2 g/dL (ref 12.0–15.0)
MCHC: 32.7 g/dL (ref 30.0–36.0)
MCV: 88.4 fl (ref 78.0–100.0)
Platelets: 368 10*3/uL (ref 150.0–400.0)
RBC: 4.23 Mil/uL (ref 3.87–5.11)
RDW: 14.3 % (ref 11.5–15.5)
WBC: 7.8 10*3/uL (ref 4.0–10.5)

## 2020-01-26 LAB — TSH: TSH: 0.21 u[IU]/mL — ABNORMAL LOW (ref 0.35–4.50)

## 2020-01-26 LAB — COMPREHENSIVE METABOLIC PANEL
ALT: 19 U/L (ref 0–35)
AST: 14 U/L (ref 0–37)
Albumin: 3.9 g/dL (ref 3.5–5.2)
Alkaline Phosphatase: 76 U/L (ref 39–117)
BUN: 20 mg/dL (ref 6–23)
CO2: 26 mEq/L (ref 19–32)
Calcium: 9.4 mg/dL (ref 8.4–10.5)
Chloride: 106 mEq/L (ref 96–112)
Creatinine, Ser: 0.63 mg/dL (ref 0.40–1.20)
GFR: 97.4 mL/min (ref >60.00)
Glucose, Bld: 98 mg/dL (ref 70–99)
Potassium: 4.5 mEq/L (ref 3.5–5.1)
Sodium: 139 mEq/L (ref 135–145)
Total Bilirubin: 0.3 mg/dL (ref 0.2–1.2)
Total Protein: 6.9 g/dL (ref 6.0–8.3)

## 2020-01-26 LAB — VITAMIN D 25 HYDROXY (VIT D DEFICIENCY, FRACTURES): VITD: 35.96 ng/mL (ref 30.00–100.00)

## 2020-01-26 LAB — HEMOGLOBIN A1C: Hgb A1c MFr Bld: 6 % (ref 4.6–6.5)

## 2020-01-26 MED ORDER — LEVOTHYROXINE SODIUM 100 MCG PO TABS: 100.0000 ug | ORAL_TABLET | Freq: Every day | ORAL | 3 refills | Status: DC

## 2020-01-26 MED ORDER — VENLAFAXINE HCL ER 75 MG PO CP24: 75.0000 mg | ORAL_CAPSULE | Freq: Every day | ORAL | 3 refills | Status: DC

## 2020-01-26 NOTE — Progress Notes (Signed)
This visit occurred during the SARS-CoV-2 public health emergency.  Safety protocols were in place, including screening questions prior to the visit, additional usage of staff PPE, and extensive cleaning of exam room while observing appropriate contact time as indicated for disinfecting solutions.    Patient ID: Molly Park, female  DOB: 1960/12/30, 59 y.o.   MRN: [id number] Patient Care Team    Relationship Specialty Notifications Start End  Natalia Leatherwood, DO PCP - General Family Medicine  01/14/17   Romualdo Bolk, MD Consulting Physician Obstetrics and Gynecology  01/14/18     Chief Complaint  Patient presents with  . Annual Exam    Pap smear 12/25/17 by GYN, Mammogram 2018, Colonoscopy 2015. Fasting.     Subjective:  Molly Park is a 59 y.o.  Female  present for CPE. All past medical history, surgical history, allergies, family history, immunizations, medications and social history were updated in the electronic medical record today. All recent labs, ED visits and hospitalizations within the last year were reviewed.  Hot flashes: Doing well. Working well for her hot flashes.  Prior note: She presents today to talk about her hot flashes. She reports that the Celexa was working rather well until recently. We have even increased her dose, which did not seem very effective for her. She would like to try something else today. Prior note: pt has seen her gyn and told she is in perimenopause. She reports celexa 40 mg Qd was working really well, until she forgot 3 doses 3 weeks ago. She still feels they are improved from prior.  Health maintenance:  Colonoscopy: no fhx, screen completed 01/21/2014 w/ polyps, at [facility]. She was told 5 year follow up> her appt was canceled d/t covid>> she will call to reschedule.  Mammogram: completed:03/05/2017, birads1. [facility].Had appt 01/2019> canceled d/t covid> reordered today.  Cervical cancer  screening: last pap: 2019, results:Dr. Oscar La Neg hpv/nl pap. Immunizations: tdap3/2018 UTD. Influenza UTD declined (encouraged yearly). Shingrix #1 01/2019> did not return for 2nd d/t covid> declined today. She has received her covid series she will call in with the dates so we can abstract them.  Infectious disease: HIV & Hep C completed Assistive device: none Oxygen YQM:VHQI Patient has a Dental home. Hospitalizations/ED visits: reviewed  Depression screen [facility] 2/9 01/26/2020 01/20/2019 10/27/2018 07/22/2018 01/14/2018  Decreased Interest 0 0 0 0 0  Down, Depressed, Hopeless 0 0 0 0 0  PHQ - 2 Score 0 0 0 0 0  Altered sleeping 1 0 1 - -  Tired, decreased energy 0 0 3 - -  Change in appetite 0 0 0 - -  Feeling bad or failure about yourself  0 0 0 - -  Trouble concentrating 0 0 0 - -  Moving slowly or fidgety/restless 0 0 1 - -  Suicidal thoughts 0 0 0 - -  PHQ-9 Score 1 0 5 - -  Difficult doing work/chores Not difficult at all Not difficult at all Not difficult at all - -   GAD 7 : Generalized Anxiety Score 01/20/2019 10/27/2018  Nervous, Anxious, on Edge 0 1  Control/stop worrying 0 1  Worry too much - different things 0 1  Trouble relaxing 0 1  Restless 0 1  Easily annoyed or irritable 0 0  Afraid - awful might happen 0 0  Total GAD 7 Score 0 5  Anxiety Difficulty Not difficult at all Not difficult at all  Immunization History  Administered Date(s) Administered  . Hepatitis A, Ped/Adol-2 Dose 12/07/2012  . Influenza, Seasonal, Injecte, Preservative Fre 09/15/2014  . Influenza-Unspecified 08/11/2016, 09/15/2018  . Tdap 01/14/2017  . Zoster Recombinat (Shingrix) 01/20/2019     Past Medical History:  Diagnosis Date  . Bilateral edema of lower extremity    tried on low dose lasix by prior provider  . Chicken pox   . Hemorrhoid   . HSV infection   . Hypothyroidism   . PONV (postoperative nausea and vomiting)   . Rotator cuff impingement syndrome of right  shoulder    No Known Allergies Past Surgical History:  Procedure Laterality Date  . APPENDECTOMY  1973  . BREAST LUMPECTOMY Right 2014  . EXAM UNDER ANESTHESIA WITH MANIPULATION OF SHOULDER Right 01/17/2020   Procedure: Right shoulder manipulation under anesthesia;  Surgeon: Jones Broom, MD;  Location: [facility];  Service: Orthopedics;  Laterality: Right;  . HEMORRHOID SURGERY    . OPEN REDUCTION INTERNAL FIXATION (ORIF) DISTAL RADIAL FRACTURE Right 08/12/2019   Procedure: OPEN REDUCTION INTERNAL FIXATION (ORIF) DISTAL RADIAL FRACTURE;  Surgeon: Betha Loa, MD;  Location: [facility];  Service: Orthopedics;  Laterality: Right;  . SHOULDER INJECTION Right 01/17/2020   Procedure: SHOULDER STEROID INJECTION;  Surgeon: Jones Broom, MD;  Location: [facility];  Service: Orthopedics;  Laterality: Right;  . TUBAL LIGATION  2004   Family History  Problem Relation Age of Onset  . Diabetes Mother   . Valvular heart disease Father   . Breast cancer Maternal Aunt        56   Social History   Social History Narrative   Patient is married to [place]. They have 5 children btw them.   BA degree. 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   Drinks caffeine.   Wears a seatbelt, wears a bicycle helmet, smoke detector in the home.   Firearms in the home.   Exercises routinely.   Feels safe in her relationships.    Allergies as of 01/26/2020   No Known Allergies     Medication List       Accurate as of January 26, 2020  8:53 AM. If you have any questions, ask your nurse or doctor.        STOP taking these medications   Diclofenac Sodium CR 100 MG 24 hr tablet Stopped by: Felix Pacini, DO   HYDROcodone-acetaminophen 5-325 MG tablet Commonly known as: Norco Stopped by: Felix Pacini, DO   metaxalone 800 MG tablet Commonly known as: Skelaxin Stopped by: Felix Pacini, DO   ondansetron 4 MG tablet Commonly known  as: Zofran Stopped by: Felix Pacini, DO   oxyCODONE-acetaminophen 5-325 MG tablet Commonly known as: Percocet Stopped by: Felix Pacini, DO     TAKE these medications   levothyroxine 100 MCG tablet Commonly known as: Synthroid 1 tab (100 mcg) QD x6 days a week, 1.5 tabs (150 mcg) x1 day a week.   SM Lorata-dine D 10-240 MG 24 hr tablet Generic drug: loratadine-pseudoephedrine TAKE 1 TABLET BY MOUTH DAILY.   tiZANidine 4 MG tablet Commonly known as: Zanaflex Take 1 tablet (4 mg total) by mouth every 8 (eight) hours as needed for muscle spasms.   valACYclovir 500 MG tablet Commonly known as: VALTREX For oral hsv, take 4 tablets BID for one day. For genital hsv take one tablet BID for 3 days   venlafaxine XR 75 MG 24 hr capsule Commonly known  as: Effexor XR Take 1 capsule (75 mg total) by mouth daily with breakfast.   Vitamin D 50 MCG (2000 UT) tablet Take 2,000 Units by mouth daily.       All past medical history, surgical history, allergies, family history, immunizations andmedications were updated in the EMR today and reviewed under the history and medication portions of their EMR.      No results found.   ROS: 14 pt review of systems performed and negative (unless mentioned in an HPI)  Objective: BP 115/76 (BP Location: Left Arm, Patient Position: Sitting, Cuff Size: Normal)   Pulse 84   Temp 97.7 F (36.5 C) (Temporal)   Resp 16   Ht 5\' 5"  (1.651 m)   Wt 174 lb 8 oz (79.2 kg)   LMP  (LMP Unknown)   SpO2 91%   BMI 29.04 kg/m  Gen: Afebrile. No acute distress. Nontoxic in appearance, well-developed, well-nourished,  Pleasant female.  HENT: AT. [place]. Bilateral TM visualized and normal in appearance, normal external auditory canal. MMM, no oral lesions, adequate dentition. Bilateral nares within normal limits. Cough on exam, no hoarseness on exam. Eyes:Pupils Equal Round Reactive to light, Extraocular movements intact,  Conjunctiva without redness, discharge or  icterus. Neck/lymp/endocrine: Supple,no lymphadenopathy, no thyromegaly CV: RRR no murmur, no edema, +2/4 P posterior tibialis pulses. no carotid bruits. No JVD. Chest: CTAB, no wheeze, rhonchi or crackles. normal Respiratory effort. good Air movement. Abd: Soft. flat. NTND. BS present. no Masses palpated. No hepatosplenomegaly. No rebound tenderness or guarding. Skin: no rashes, purpura or petechiae. Warm and well-perfused. Skin intact. Neuro/Msk:  Normal gait. PERLA. EOMi. Alert. Oriented x3.  Cranial nerves II through XII intact. Muscle strength 5/5 upper/lower extremity. DTRs equal bilaterally. Psych: Normal affect, dress and demeanor. Normal speech. Normal thought content and judgment.   No exam data present  Assessment/plan: Molly Park is a 59 y.o. female present for CPE Hot flashes: -stable.  -  Continue  effexor 75 qd for hot flashes   Elevated hemoglobin A1c - Hemoglobin A1c  Hypothyroidism, unspecified type Continue levothy 100 mcg QD- check levels today and then appropriate dose refills will be prescribed for her after results.  - CBC - Comprehensive metabolic panel - TSH  Vitamin D deficiency Continue reported 2000u daily. Recheck levels today to ensure adequate dosing.  - Vitamin D (25 hydroxy)  Encounter for long-term (current) use of medications - CBC - Comprehensive metabolic panel  Mixed hyperlipidemia/Statin declined/Overweight (BMI 25.0-29.9) Lipids last her with LDL above goal. She declined statin and wanted to work on diet and exercise. She has lost 5 lbs over the last year - CBC - Comprehensive metabolic panel - Lipid panel - TSH  Encounter for screening mammogram for malignant neoplasm of breast - MM 3D SCREEN BREAST BILATERAL; Future  Encounter for preventive health examination *Patient was encouraged to exercise greater than 150 minutes a week. Patient was encouraged to choose a diet filled with fresh fruits and vegetables, and lean  meats. AVS provided to patient today for education/recommendation on gender specific health and safety maintenance. Colonoscopy: no fhx, screen completed 01/21/2014 w/ polyps, at [facility] health specialist. She was told 5 year follow up> her appt was canceled d/t covid>> she will call to reschedule.  Mammogram: completed:03/05/2017, birads1. [facility].Had appt 01/2019> canceled d/t covid> reordered today.  Cervical cancer screening: last pap: 2019, results:Dr. Talbert Nan Neg hpv/nl pap. Immunizations: tdap3/2018 UTD. Influenza UTD declined (encouraged yearly). Shingrix #1 01/2019> did not return for 2nd d/t covid>  declined s2nd shingrix today. She has received her covid series she will call in with the dates so we can abstract them.   Return in about 1 year (around 01/25/2021) for CPE (30 min).  Orders Placed This Encounter  Procedures  . MM 3D SCREEN BREAST BILATERAL  . CBC  . Comprehensive metabolic panel  . Hemoglobin A1c  . Lipid panel  . TSH  . Vitamin D (25 hydroxy)    Orders Placed This Encounter  Procedures  . MM 3D SCREEN BREAST BILATERAL  . CBC  . Comprehensive metabolic panel  . Hemoglobin A1c  . Lipid panel  . TSH  . Vitamin D (25 hydroxy)   Meds ordered this encounter  Medications  . venlafaxine XR (EFFEXOR XR) 75 MG 24 hr capsule    Sig: Take 1 capsule (75 mg total) by mouth daily with breakfast.    Dispense:  90 capsule    Refill:  3   Referral Orders  No referral(s) requested today     Electronically signed by: Felix Pacini, DO [facility] [place]

## 2020-01-26 NOTE — Patient Instructions (Signed)
Health Maintenance, Female Adopting a healthy lifestyle and getting preventive care are important in promoting health and wellness. Ask your health care provider about:  The right schedule for you to have regular tests and exams.  Things you can do on your own to prevent diseases and keep yourself healthy. What should I know about diet, weight, and exercise? Eat a healthy diet   Eat a diet that includes plenty of vegetables, fruits, low-fat dairy products, and lean protein.  Do not eat a lot of foods that are high in solid fats, added sugars, or sodium. Maintain a healthy weight Body mass index (BMI) is used to identify weight problems. It estimates body fat based on height and weight. Your health care provider can help determine your BMI and help you achieve or maintain a healthy weight. Get regular exercise Get regular exercise. This is one of the most important things you can do for your health. Most adults should:  Exercise for at least 150 minutes each week. The exercise should increase your heart rate and make you sweat (moderate-intensity exercise).  Do strengthening exercises at least twice a week. This is in addition to the moderate-intensity exercise.  Spend less time sitting. Even light physical activity can be beneficial. Watch cholesterol and blood lipids Have your blood tested for lipids and cholesterol at 57 years of age, then have this test every 5 years. Have your cholesterol levels checked more often if:  Your lipid or cholesterol levels are high.  You are older than 57 years of age.  You are at high risk for heart disease. What should I know about cancer screening? Depending on your health history and family history, you may need to have cancer screening at various ages. This may include screening for:  Breast cancer.  Cervical cancer.  Colorectal cancer.  Skin cancer.  Lung cancer. What should I know about heart disease, diabetes, and high blood  pressure? Blood pressure and heart disease  High blood pressure causes heart disease and increases the risk of stroke. This is more likely to develop in people who have high blood pressure readings, are of African descent, or are overweight.  Have your blood pressure checked: ? Every 3-5 years if you are 18-39 years of age. ? Every year if you are 40 years old or older. Diabetes Have regular diabetes screenings. This checks your fasting blood sugar level. Have the screening done:  Once every three years after age 40 if you are at a normal weight and have a low risk for diabetes.  More often and at a younger age if you are overweight or have a high risk for diabetes. What should I know about preventing infection? Hepatitis B If you have a higher risk for hepatitis B, you should be screened for this virus. Talk with your health care provider to find out if you are at risk for hepatitis B infection. Hepatitis C Testing is recommended for:  Everyone born from 1945 through 1965.  Anyone with known risk factors for hepatitis C. Sexually transmitted infections (STIs)  Get screened for STIs, including gonorrhea and chlamydia, if: ? You are sexually active and are younger than 57 years of age. ? You are older than 57 years of age and your health care provider tells you that you are at risk for this type of infection. ? Your sexual activity has changed since you were last screened, and you are at increased risk for chlamydia or gonorrhea. Ask your health care provider if   you are at risk.  Ask your health care provider about whether you are at high risk for HIV. Your health care provider may recommend a prescription medicine to help prevent HIV infection. If you choose to take medicine to prevent HIV, you should first get tested for HIV. You should then be tested every 3 months for as long as you are taking the medicine. Pregnancy  If you are about to stop having your period (premenopausal) and  you may become pregnant, seek counseling before you get pregnant.  Take 400 to 800 micrograms (mcg) of folic acid every day if you become pregnant.  Ask for birth control (contraception) if you want to prevent pregnancy. Osteoporosis and menopause Osteoporosis is a disease in which the bones lose minerals and strength with aging. This can result in bone fractures. If you are 65 years old or older, or if you are at risk for osteoporosis and fractures, ask your health care provider if you should:  Be screened for bone loss.  Take a calcium or vitamin D supplement to lower your risk of fractures.  Be given hormone replacement therapy (HRT) to treat symptoms of menopause. Follow these instructions at home: Lifestyle  Do not use any products that contain nicotine or tobacco, such as cigarettes, e-cigarettes, and chewing tobacco. If you need help quitting, ask your health care provider.  Do not use street drugs.  Do not share needles.  Ask your health care provider for help if you need support or information about quitting drugs. Alcohol use  Do not drink alcohol if: ? Your health care provider tells you not to drink. ? You are pregnant, may be pregnant, or are planning to become pregnant.  If you drink alcohol: ? Limit how much you use to 0-1 drink a day. ? Limit intake if you are breastfeeding.  Be aware of how much alcohol is in your drink. In the [place] one drink equals one 12 oz bottle of beer (355 mL), one 5 oz glass of wine (148 mL), or one 1 oz glass of hard liquor (44 mL). General instructions  Schedule regular health, dental, and eye exams.  Stay current with your vaccines.  Tell your health care provider if: ? You often feel depressed. ? You have ever been abused or do not feel safe at home. Summary  Adopting a healthy lifestyle and getting preventive care are important in promoting health and wellness.  Follow your health care provider's instructions about healthy  diet, exercising, and getting tested or screened for diseases.  Follow your health care provider's instructions on monitoring your cholesterol and blood pressure. This information is not intended to replace advice given to you by your health care provider. Make sure you discuss any questions you have with your health care provider. Document Revised: 10/21/2018 Document Reviewed: 10/21/2018 Elsevier Patient Education  2020 Elsevier Inc[employer]  

## 2020-01-26 NOTE — Telephone Encounter (Signed)
Please inform patient: Her liver and kidney function is normal. Vitamin D levels are within normal range-continue current supplement. Blood cell counts are in normal range Her thyroid is over replaced.  I have refilled her prescription at the same dose per tablet she is to only take 1/day 7 days a week (prior prescription had her taking 1 tab 6 days a week and 1-1/2 x 1 day a week) A1c/diabetes screen is increased from 5.6 last year to 6.0 this year.  This does place her in the prediabetic range.  No medications are needed at this time but - I would recommend a mediterranean diet and regular exercise.  A mediterranean diet is high in fruits, vegetables, whole grains, fish, chicken, nuts, healthy fats (olive oil or canola oil). Low fat dairy. There are many online resources and books on this diet. Limit butter, margarine, red meat and sweets.   Lastly her cholesterol panel is almost exactly the same as last year, a little bit lower with a total cholesterol of 258 last year now 244.  LDL 163 last year is now 153.  This still puts her at increased risk of heart attack and stroke above other females her age.  Last time she declined start of statin and wanted to work on exercise and diet first.  I would encourage her to consider a low-dose statin to provide her cardiovascular protection from heart attack and stroke and help lower her cholesterol.  If she would like to start I will call this in for her today in follow-up in 3 months for repeat cholesterol check and LFTs if she starts the statin.

## 2020-02-03 MED ORDER — ATORVASTATIN CALCIUM 20 MG PO TABS: 20.0000 mg | ORAL_TABLET | Freq: Every day | ORAL | 3 refills | Status: DC

## 2020-02-03 MED FILL — ATORVASTATIN 20 MG TABLET: 20 | 90 days supply | Qty: 90 | Fill #0

## 2020-02-03 NOTE — Telephone Encounter (Signed)
Atorvastatin 20 mg called in

## 2020-02-03 NOTE — Addendum Note (Signed)
Addended by: Felix Pacini A on: 02/03/2020 12:42 PM   Modules accepted: Orders

## 2020-02-03 NOTE — Telephone Encounter (Signed)
Pt was called and given information. She would like to start the low dose statin medication and would like this sent to [facility]. She was advised to F/U in three months but said she would have to call back and schedule that bc she is driving.

## 2020-02-18 ENCOUNTER — Other Ambulatory Visit: Payer: Self-pay | Admitting: Family Medicine

## 2020-02-18 DIAGNOSIS — M545 Low back pain, unspecified: Secondary | ICD-10-CM

## 2020-02-24 ENCOUNTER — Other Ambulatory Visit: Payer: Self-pay | Admitting: Family Medicine

## 2020-02-24 DIAGNOSIS — M545 Low back pain, unspecified: Secondary | ICD-10-CM

## 2020-02-24 MED FILL — VALACYCLOVIR HCL 500 MG TAB: 500 | 30 days supply | Qty: 30 | Fill #1

## 2020-02-24 MED FILL — SM LORATA-DINE D 24HR TABLE: 10-240 | 30 days supply | Qty: 30 | Fill #1

## 2020-02-24 MED FILL — VENLAFAXINE HCL ER 75 MG CA: 75 | 90 days supply | Qty: 90 | Fill #0

## 2020-03-08 ENCOUNTER — Other Ambulatory Visit: Payer: Self-pay

## 2020-03-08 ENCOUNTER — Other Ambulatory Visit: Payer: Self-pay | Admitting: Family Medicine

## 2020-03-08 DIAGNOSIS — M545 Low back pain, unspecified: Secondary | ICD-10-CM

## 2020-03-08 MED ORDER — DICLOFENAC SODIUM ER 100 MG PO TB24: 100.0000 mg | ORAL_TABLET | Freq: Every day | ORAL | 3 refills | Status: DC

## 2020-03-08 MED FILL — DICLOFENAC SOD ER 100 MG TA: 100 | 90 days supply | Qty: 90 | Fill #0

## 2020-03-08 NOTE — Telephone Encounter (Signed)
Pt was called and VM was left letting pt know medication had been sent to the pharmacy

## 2020-03-08 NOTE — Telephone Encounter (Signed)
Pt left VM on nurse line asking for refill on medication.  RF request for Diclofenac CR 100mg  LOV:01/26/2020 Next ov: Not scheduled  Last written: 01/08/2019 #90 x3 refills   Pt states she has been taking this medications daily to help with back pain and it is very helpful. She did D/C for short time after recent surgery due to taking other pain meds.

## 2020-04-14 MED FILL — SYNTHROID 100 MCG TABLET: 100 | 90 days supply | Qty: 90 | Fill #0

## 2020-05-08 MED FILL — ATORVASTATIN 20 MG TABLET: 20 | 90 days supply | Qty: 90 | Fill #1

## 2020-05-31 MED FILL — VENLAFAXINE HCL ER 75 MG CA: 75 | 90 days supply | Qty: 90 | Fill #1

## 2020-05-31 MED FILL — DICLOFENAC SOD ER 100 MG TA: 100 | 90 days supply | Qty: 90 | Fill #1

## 2020-07-18 MED FILL — SYNTHROID 100 MCG TABLET: 100 | 90 days supply | Qty: 90 | Fill #1

## 2020-08-03 MED FILL — ATORVASTATIN 20 MG TABLET: 20 | 90 days supply | Qty: 90 | Fill #2

## 2020-08-03 MED FILL — SM LORATA-DINE D 24HR TABLE: 10-240 | 30 days supply | Qty: 30 | Fill #2

## 2020-08-18 ENCOUNTER — Telehealth: Payer: Self-pay

## 2020-08-18 ENCOUNTER — Ambulatory Visit: Payer: No Typology Code available for payment source | Admitting: Family Medicine

## 2020-08-18 ENCOUNTER — Encounter: Payer: Self-pay | Admitting: Family Medicine

## 2020-08-18 ENCOUNTER — Other Ambulatory Visit: Payer: Self-pay | Admitting: Family Medicine

## 2020-08-18 ENCOUNTER — Other Ambulatory Visit: Payer: Self-pay

## 2020-08-18 VITALS — BP 124/82 | HR 84 | Ht 65.0 in | Wt 181.4 lb

## 2020-08-18 DIAGNOSIS — M5412 Radiculopathy, cervical region: Secondary | ICD-10-CM

## 2020-08-18 DIAGNOSIS — M542 Cervicalgia: Secondary | ICD-10-CM | POA: Diagnosis not present

## 2020-08-18 MED ORDER — TIZANIDINE HCL 4 MG PO TABS: 4.0000 mg | ORAL_TABLET | Freq: Four times a day (QID) | ORAL | 1 refills | Status: DC | PRN

## 2020-08-18 MED ORDER — GABAPENTIN 300 MG PO CAPS: 300.0000 mg | ORAL_CAPSULE | Freq: Three times a day (TID) | ORAL | 3 refills | Status: DC | PRN

## 2020-08-18 MED FILL — tiZANidine HCL 4 MG TABS: 4 | 8 days supply | Qty: 30 | Fill #0

## 2020-08-18 MED FILL — GABAPENTIN 300 MG CAPSULE: 300 | 30 days supply | Qty: 90 | Fill #0

## 2020-08-18 NOTE — Patient Instructions (Addendum)
Thank you for coming in today.  I've referred you to Physical Therapy.  Let us know if you don't hear from them in one week.  Take the gabapentin up to 3x daily for nerve pain as needed for nerve pain  Take the tizanidine muscle relaxer up to 3x daily as needed for muscle spasm  TENS UNIT: This is helpful for muscle pain and spasm.   Search and Purchase a TENS 7000 2nd edition at  www.tenspros.com or www.Amazon.com It should be less than $30.     TENS unit instructions: Do not shower or bathe with the unit on . Turn the unit off before removing electrodes or batteries . If the electrodes lose stickiness add a drop of water to the electrodes after they are disconnected from the unit and place on plastic sheet. If you continued to have difficulty, call the TENS unit company to purchase more electrodes. . Do not apply lotion on the skin area prior to use. Make sure the skin is clean and dry as this will help prolong the life of the electrodes. . After use, always check skin for unusual red areas, rash or other skin difficulties. If there are any skin problems, does not apply electrodes to the same area. . Never remove the electrodes from the unit by pulling the wires. . Do not use the TENS unit or electrodes other than as directed. . Do not change electrode placement without consultating your therapist or physician. Marland Kitchen Keep 2 fingers with between each electrode. . Wear time ratio is 2:1, on to off times.    For example on for 30 minutes off for 15 minutes and then on for 30 minutes off for 15 minutes    Cervical Strain and Sprain Rehab Ask your health care provider which exercises are safe for you. Do exercises exactly as told by your health care provider and adjust them as directed. It is normal to feel mild stretching, pulling, tightness, or discomfort as you do these exercises. Stop right away if you feel sudden pain or your pain gets worse. Do not begin these exercises until told by  your health care provider. Stretching and range-of-motion exercises Cervical side bending  Using good posture, sit on a stable chair or stand up. Without moving your shoulders, slowly tilt your left / right ear to your shoulder until you feel a stretch in the opposite side neck muscles. You should be looking straight ahead. Hold for __________ seconds. Repeat with the other side of your neck. Repeat __________ times. Complete this exercise __________ times a day. Cervical rotation  Using good posture, sit on a stable chair or stand up. Slowly turn your head to the side as if you are looking over your left / right shoulder. Keep your eyes level with the ground. Stop when you feel a stretch along the side and the back of your neck. Hold for __________ seconds. Repeat this by turning to your other side. Repeat __________ times. Complete this exercise __________ times a day. Thoracic extension and pectoral stretch Roll a towel or a small blanket so it is about 4 inches (10 cm) in diameter. Lie down on your back on a firm surface. Put the towel lengthwise, under your spine in the middle of your back. It should not be under your shoulder blades. The towel should line up with your spine from your middle back to your lower back. Put your hands behind your head and let your elbows fall out to your sides.  Hold for __________ seconds. Repeat __________ times. Complete this exercise __________ times a day. Strengthening exercises Isometric upper cervical flexion Lie on your back with a thin pillow behind your head and a small rolled-up towel under your neck. Gently tuck your chin toward your chest and nod your head down to look toward your feet. Do not lift your head off the pillow. Hold for __________ seconds. Release the tension slowly. Relax your neck muscles completely before you repeat this exercise. Repeat __________ times. Complete this exercise __________ times a day. Isometric cervical  extension  Stand about 6 inches (15 cm) away from a wall, with your back facing the wall. Place a soft object, about 6-8 inches (15-20 cm) in diameter, between the back of your head and the wall. A soft object could be a small pillow, a ball, or a folded towel. Gently tilt your head back and press into the soft object. Keep your jaw and forehead relaxed. Hold for __________ seconds. Release the tension slowly. Relax your neck muscles completely before you repeat this exercise. Repeat __________ times. Complete this exercise __________ times a day. Posture and body mechanics Body mechanics refers to the movements and positions of your body while you do your daily activities. Posture is part of body mechanics. Good posture and healthy body mechanics can help to relieve stress in your body's tissues and joints. Good posture means that your spine is in its natural S-curve position (your spine is neutral), your shoulders are pulled back slightly, and your head is not tipped forward. The following are general guidelines for applying improved posture and body mechanics to your everyday activities. Sitting  When sitting, keep your spine neutral and keep your feet flat on the floor. Use a footrest, if necessary, and keep your thighs parallel to the floor. Avoid rounding your shoulders, and avoid tilting your head forward. When working at a desk or a computer, keep your desk at a height where your hands are slightly lower than your elbows. Slide your chair under your desk so you are close enough to maintain good posture. When working at a computer, place your monitor at a height where you are looking straight ahead and you do not have to tilt your head forward or downward to look at the screen. Standing  When standing, keep your spine neutral and keep your feet about hip-width apart. Keep a slight bend in your knees. Your ears, shoulders, and hips should line up. When you do a task in which you stand in one  place for a long time, place one foot up on a stable object that is 2-4 inches (5-10 cm) high, such as a footstool. This helps keep your spine neutral. Resting When lying down and resting, avoid positions that are most painful for you. Try to support your neck in a neutral position. You can use a contour pillow or a small rolled-up towel. Your pillow should support your neck but not push on it. This information is not intended to replace advice given to you by your health care provider. Make sure you discuss any questions you have with your health care provider. Document Revised: 02/17/2019 Document Reviewed: 07/29/2018 Elsevier Patient Education  2020 [employer].
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
## 2020-08-18 NOTE — Telephone Encounter (Signed)
LVM for pt to CB regarding prior message.  ?

## 2020-08-18 NOTE — Telephone Encounter (Signed)
Patient called to say she needed referral for sports medicine appt today, She is on her way to appt 8:45. For neck pain  Please enter referral per [facility]  New Patient appt [facility] - Sports Physical Medicine Dr. [name]   If any questions, patient can be reached at [phone]

## 2020-08-24 ENCOUNTER — Ambulatory Visit (INDEPENDENT_AMBULATORY_CARE_PROVIDER_SITE_OTHER)
Admission: RE | Admit: 2020-08-24 | Discharge: 2020-08-24 | Disposition: A | Discharge status: Home / Self Care | Payer: No Typology Code available for payment source | Source: Ambulatory Visit | Attending: Family Medicine | Admitting: Family Medicine

## 2020-08-24 ENCOUNTER — Encounter: Payer: Self-pay | Admitting: Family Medicine

## 2020-08-24 ENCOUNTER — Other Ambulatory Visit: Payer: Self-pay | Admitting: Family Medicine

## 2020-08-24 ENCOUNTER — Ambulatory Visit: Payer: No Typology Code available for payment source | Admitting: Family Medicine

## 2020-08-24 ENCOUNTER — Other Ambulatory Visit: Payer: Self-pay

## 2020-08-24 VITALS — BP 106/76 | HR 73 | Ht 65.0 in | Wt 182.0 lb

## 2020-08-24 DIAGNOSIS — M999 Biomechanical lesion, unspecified: Secondary | ICD-10-CM

## 2020-08-24 DIAGNOSIS — M542 Cervicalgia: Secondary | ICD-10-CM | POA: Diagnosis not present

## 2020-08-24 HISTORY — DX: Biomechanical lesion, unspecified: M99.9

## 2020-08-24 MED ORDER — METHYLPREDNISOLONE ACETATE 80 MG/ML IJ SUSP
80.0000 mg | Freq: Once | INTRAMUSCULAR | Status: AC
  Administered 2020-08-24: 80 mg via INTRAMUSCULAR

## 2020-08-24 MED ORDER — KETOROLAC TROMETHAMINE 60 MG/2ML IM SOLN
60.0000 mg | Freq: Once | INTRAMUSCULAR | Status: AC
  Administered 2020-08-24: 60 mg via INTRAMUSCULAR

## 2020-08-24 MED ORDER — PREDNISONE 50 MG PO TABS: ORAL_TABLET | ORAL | 0 refills | Status: DC

## 2020-08-24 MED FILL — predniSONE 50 MG TABS: 50 | 5 days supply | Qty: 5 | Fill #0

## 2020-08-24 NOTE — Patient Instructions (Addendum)
Xray at [facility] Exercises 3x a week Prednisone- start tomorrow Hold all of there anti-inflammatories Can take gabapentin and zanaflex at night See me in 2-3 weeks

## 2020-08-24 NOTE — Assessment & Plan Note (Signed)
Seems to be more of a muscle spasm.  X-rays pending but unfortunately x-rays done at our facility.  She will get it at another facility in the near future.  Patient given Toradol and Depo-Medrol today.  Patient can continue the gabapentin as well as the muscle relaxer at night.  Prednisone given for 5 days and will hold on any other anti-inflammatories.  Exercises given from athletic trainer today as well.  Follow-up with me again in 2 to 4 weeks attempted manipulation with mild improvement

## 2020-08-24 NOTE — Progress Notes (Signed)
[facility] [address] [place] [address] Phone: [phone] Subjective:   Bruce Donath, am serving as a scribe for Dr. Antoine Primas. This visit occurred during the SARS-CoV-2 public health emergency.  Safety protocols were in place, including screening questions prior to the visit, additional usage of staff PPE, and extensive cleaning of exam room while observing appropriate contact time as indicated for disinfecting solutions.   I'm seeing this patient by the request  of:  Kuneff, Renee A, DO  CC: Neck pain  LAG:[id number]  Molly Park is a 59 y.o. female coming in with complaint of cervical spine pain. Patient states that she has been having pain for one week. Pain at base of skull with radiating symptoms into left arm. Using gabapentin. Not improving over the past few days.   Patient states that it is severe.  Continuing to give her pain and seems to be worse throughout the day.  Denies any abdominal pain, denies any chest pain.  Seems to be on the backside of the neck left-sided. Also taking diclofenac.     Past Medical History:  Diagnosis Date  . Bilateral edema of lower extremity    tried on low dose lasix by prior provider  . Chicken pox   . Hemorrhoid   . HSV infection   . Hypothyroidism   . PONV (postoperative nausea and vomiting)   . Rotator cuff impingement syndrome of right shoulder    Past Surgical History:  Procedure Laterality Date  . APPENDECTOMY  1973  . BREAST LUMPECTOMY Right 2014  . EXAM UNDER ANESTHESIA WITH MANIPULATION OF SHOULDER Right 01/17/2020   Procedure: Right shoulder manipulation under anesthesia;  Surgeon: Jones Broom, MD;  Location: [facility];  Service: Orthopedics;  Laterality: Right;  . HEMORRHOID SURGERY    . OPEN REDUCTION INTERNAL FIXATION (ORIF) DISTAL RADIAL FRACTURE Right 08/12/2019   Procedure: OPEN REDUCTION INTERNAL FIXATION (ORIF) DISTAL RADIAL FRACTURE;  Surgeon:  Betha Loa, MD;  Location: [facility];  Service: Orthopedics;  Laterality: Right;  . SHOULDER INJECTION Right 01/17/2020   Procedure: SHOULDER STEROID INJECTION;  Surgeon: Jones Broom, MD;  Location:  SURGERY CENTER;  Service: Orthopedics;  Laterality: Right;  . TUBAL LIGATION  2004   Social History   Socioeconomic History  . Marital status: Legally Separated    Spouse name: Thereasa Distance  . Number of children: 5  . Years of education: BA  . Highest education level: Not on file  Occupational History  . Occupation: Merchant navy officer  Tobacco Use  . Smoking status: Never Smoker  . Smokeless tobacco: Never Used  Vaping Use  . Vaping Use: Never used  Substance and Sexual Activity  . Alcohol use: Yes    Alcohol/week: 1.0 standard drink    Types: 1 Glasses of wine per week    Comment: social  . Drug use: No  . Sexual activity: Yes    Partners: Male    Birth control/protection: Surgical  Other Topics Concern  . Not on file  Social History Narrative   Patient is married to [place]. They have 5 children btw them.   BA degree. 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   Drinks caffeine.   Wears a seatbelt, wears a bicycle helmet, smoke detector in the home.   Firearms in the home.   Exercises routinely.   Feels safe in her relationships.   Social Determinants of Corporate investment banker  Strain:   . Difficulty of Paying Living Expenses: Not on file  Food Insecurity:   . Worried About Programme researcher, broadcasting/film/video in the Last Year: Not on file  . Ran Out of Food in the Last Year: Not on file  Transportation Needs:   . Lack of Transportation (Medical): Not on file  . Lack of Transportation (Non-Medical): Not on file  Physical Activity:   . Days of Exercise per Week: Not on file  . Minutes of Exercise per Session: Not on file  Stress:   . Feeling of Stress : Not on file  Social Connections:   . Frequency of Communication with Friends  and Family: Not on file  . Frequency of Social Gatherings with Friends and Family: Not on file  . Attends Religious Services: Not on file  . Active Member of Clubs or Organizations: Not on file  . Attends Banker Meetings: Not on file  . Marital Status: Not on file   No Known Allergies Family History  Problem Relation Age of Onset  . Diabetes Mother   . Valvular heart disease Father   . Breast cancer Maternal Aunt        50    Current Outpatient Medications (Endocrine & Metabolic):  .  levothyroxine (SYNTHROID) 100 MCG tablet, Take 1 tablet (100 mcg total) by mouth daily before breakfast. .  predniSONE (DELTASONE) 50 MG tablet, Take one tablet daily for the next 5 days.  Current Outpatient Medications (Cardiovascular):  .  atorvastatin (LIPITOR) 20 MG tablet, Take 1 tablet (20 mg total) by mouth daily.  Current Outpatient Medications (Respiratory):  Marland Kitchen  SM LORATA-DINE D 10-240 MG 24 hr tablet, TAKE 1 TABLET BY MOUTH DAILY.  Current Outpatient Medications (Analgesics):  Marland Kitchen  Diclofenac Sodium CR 100 MG 24 hr tablet, Take 1 tablet (100 mg total) by mouth daily.   Current Outpatient Medications (Other):  Marland Kitchen  Cholecalciferol (VITAMIN D) 50 MCG (2000 UT) tablet, Take 2,000 Units by mouth daily. Marland Kitchen  gabapentin (NEURONTIN) 300 MG capsule, Take 1 capsule (300 mg total) by mouth 3 (three) times daily as needed (nerve pain). Marland Kitchen  tiZANidine (ZANAFLEX) 4 MG tablet, Take 1 tablet (4 mg total) by mouth every 6 (six) hours as needed for muscle spasms. .  valACYclovir (VALTREX) 500 MG tablet, For oral hsv, take 4 tablets BID for one day. For genital hsv take one tablet BID for 3 days .  venlafaxine XR (EFFEXOR XR) 75 MG 24 hr capsule, Take 1 capsule (75 mg total) by mouth daily with breakfast.   Reviewed prior external information including notes and imaging from  primary care provider As well as notes that were available from care everywhere and other healthcare systems.  Past  medical history, social, surgical and family history all reviewed in electronic medical record.  No pertanent information unless stated regarding to the chief complaint.   Review of Systems:  No headache, visual changes, nausea, vomiting, diarrhea, constipation, dizziness, abdominal pain, skin rash, fevers, chills, night sweats, weight loss, swollen lymph nodes, body aches, joint swelling, chest pain, shortness of breath, mood changes. POSITIVE muscle aches  Objective  Blood pressure 106/76, pulse 73, height 5\' 5"  (1.651 m), weight 182 lb (82.6 kg), SpO2 98 %.   General: No apparent distress alert and oriented x3 mood and affect normal, dressed appropriately.  HEENT: Pupils equal, extraocular movements intact  Respiratory: Patient's speak in full sentences and does not appear short of breath  Cardiovascular: No lower extremity  edema, non tender, no erythema  Neuro: Cranial nerves II through XII are intact, neurovascularly intact in all extremities with 2+ DTRs and 2+ pulses.  Gait normal with good balance and coordination.  MSK:  Non tender with full range of motion and good stability and symmetric strength and tone of shoulders, elbows, wrist, hip, knee and ankles bilaterally.  Neck exam shows loss of lordosis.  Tender to palpation in the parascapular region left greater than right.  Patient has some very mild increase in radicular symptoms with Spurling's.  No weakness noted and deep tendon reflexes intact.  Osteopathic findings C4 flexed rotated and side bent left C6 flexed rotated and side bent left T3 extended rotated and side bent left inhaled third rib   [id number]; 15 additional minutes spent for Therapeutic exercises as stated in above notes.  This included exercises focusing on stretching, strengthening, with significant focus on eccentric aspects.   Long term goals include an improvement in range of motion, strength, endurance as well as avoiding reinjury. Patient's frequency would  include in 1-2 times a day, 3-5 times a week for a duration of 6-12 weeks. Exercises that included:  Basic scapular stabilization to include adduction and depression of scapula Scaption, focusing on proper movement and good control Internal and External rotation utilizing a theraband, with elbow tucked at side entire time Rows with theraband   Proper technique shown and discussed handout in great detail with ATC.  All questions were discussed and answered.      Impression and Recommendations:     The above documentation has been reviewed and is accurate and complete Judi Saa, DO

## 2020-08-24 NOTE — Assessment & Plan Note (Signed)
   Decision today to treat with OMT was based on Physical Exam  After verbal consent patient was treated with HVLA, ME, FPR techniques in cervical, thoracic, rib areas, all areas are chronic   Patient tolerated the procedure well with improvement in symptoms  Patient given exercises, stretches and lifestyle modifications  See medications in patient instructions if given  Patient will follow up in 4-8 weeks 

## 2020-08-28 NOTE — Progress Notes (Signed)
59 y.o. [id number] Legally Separated White or Caucasian Hispanic or Latino female here for annual exam.  She says that she keeps getting BV every month. Every 3-4 months she notices a vaginal odor. Vagasil wash seems to help.  No vaginal bleeding.  She lives with her long term partner, no dyspareunia. No bowel or bladder issues.  She is on effexor for hot flashes, it is helping. Prescribed by her primary.     No LMP recorded (lmp unknown). Patient is postmenopausal.          Sexually active: Yes.    The current method of family planning is post menopausal status.    Exercising: Yes.    Walking  Smoker:  no  Health Maintenance: Pap:  12/25/17 WNL HR HPV Neg 2016 WNL per patient  History of abnormal Pap:  Yes had colposcopy- neg  MMG:  03/06/17 Density C Bi-rads 1 neg, she will schedule  BMD:   Never  Colonoscopy: 2015 polyp, she is due for another one.  TDaP:  01/14/17  Gardasil: na   reports that she has never smoked. She has never used smokeless tobacco. She reports current alcohol use of about 1.0 standard drink of alcohol per week. She reports that she does not use drugs. 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 Involved in Covid testing. Kids are 27-34, 5 grand children. 2 step children.   Past Medical History:  Diagnosis Date  . Bilateral edema of lower extremity    tried on low dose lasix by prior provider  . Chicken pox   . Hemorrhoid   . HSV infection   . Hypothyroidism   . PONV (postoperative nausea and vomiting)   . Rotator cuff impingement syndrome of right shoulder     Past Surgical History:  Procedure Laterality Date  . APPENDECTOMY  1973  . BREAST LUMPECTOMY Right 2014  . EXAM UNDER ANESTHESIA WITH MANIPULATION OF SHOULDER Right 01/17/2020   Procedure: Right shoulder manipulation under anesthesia;  Surgeon: Jones Broom, MD;  Location: [facility];  Service: Orthopedics;  Laterality: Right;  . HEMORRHOID SURGERY    . OPEN REDUCTION INTERNAL  FIXATION (ORIF) DISTAL RADIAL FRACTURE Right 08/12/2019   Procedure: OPEN REDUCTION INTERNAL FIXATION (ORIF) DISTAL RADIAL FRACTURE;  Surgeon: Betha Loa, MD;  Location: [facility];  Service: Orthopedics;  Laterality: Right;  . SHOULDER INJECTION Right 01/17/2020   Procedure: SHOULDER STEROID INJECTION;  Surgeon: Jones Broom, MD;  Location: [facility];  Service: Orthopedics;  Laterality: Right;  . TUBAL LIGATION  2004    Current Outpatient Medications  Medication Sig Dispense Refill  . atorvastatin (LIPITOR) 20 MG tablet Take 1 tablet (20 mg total) by mouth daily. 90 tablet 3  . Cholecalciferol (VITAMIN D) 50 MCG (2000 UT) tablet Take 2,000 Units by mouth daily.    . Diclofenac Sodium CR 100 MG 24 hr tablet Take 1 tablet (100 mg total) by mouth daily. 90 tablet 3  . gabapentin (NEURONTIN) 300 MG capsule Take 1 capsule (300 mg total) by mouth 3 (three) times daily as needed (nerve pain). 90 capsule 3  . levothyroxine (SYNTHROID) 100 MCG tablet Take 1 tablet (100 mcg total) by mouth daily before breakfast. 90 tablet 3  . SM LORATA-DINE D 10-240 MG 24 hr tablet TAKE 1 TABLET BY MOUTH DAILY. 30 tablet 5  . tiZANidine (ZANAFLEX) 4 MG tablet Take 1 tablet (4 mg total) by mouth every 6 (six) hours as needed for muscle spasms. 30  tablet 1  . valACYclovir (VALTREX) 500 MG tablet For oral hsv, take 4 tablets BID for one day. For genital hsv take one tablet BID for 3 days 30 tablet 2  . venlafaxine XR (EFFEXOR XR) 75 MG 24 hr capsule Take 1 capsule (75 mg total) by mouth daily with breakfast. 90 capsule 3   No current facility-administered medications for this visit.    Family History  Problem Relation Age of Onset  . Diabetes Mother   . Valvular heart disease Father   . Breast cancer Maternal Aunt        52    Review of Systems  All other systems reviewed and are negative.   Exam:   BP 122/64   Pulse 97   Ht 5\' 4"  (1.626 m)   Wt 178 lb (80.7 kg)   LMP   (LMP Unknown)   SpO2 100%   BMI 30.55 kg/m   Weight change: @WEIGHTCHANGE @ Height:   Height: 5\' 4"  (162.6 cm)  Ht Readings from Last 3 Encounters:  08/30/20 5\' 4"  (1.626 m)  08/24/20 5\' 5"  (1.651 m)  08/18/20 5\' 5"  (1.651 m)    General appearance: alert, cooperative and appears stated age Head: Normocephalic, without obvious abnormality, atraumatic Neck: no adenopathy, supple, symmetrical, trachea midline and thyroid normal to inspection and palpation Lungs: clear to auscultation bilaterally Cardiovascular: regular rate and rhythm Breasts: normal appearance, no masses or tenderness Abdomen: soft, non-tender; non distended,  no masses,  no organomegaly Extremities: extremities normal, atraumatic, no cyanosis or edema Skin: Skin color, texture, turgor normal. No rashes or lesions Lymph nodes: Cervical, supraclavicular, and axillary nodes normal. No abnormal inguinal nodes palpated Neurologic: Grossly normal   Pelvic: External genitalia:  no lesions              Urethra:  normal appearing urethra with no masses, tenderness or lesions              Bartholins and Skenes: normal                 Vagina: normal appearing vagina with normal color and discharge, no lesions              Cervix: large pedunculated nabothian cyst               Bimanual Exam:  Uterus:  normal size, contour, position, consistency, mobility, non-tender              Adnexa: no mass, fullness, tenderness               Rectovaginal: Confirms               Anus:  normal sphincter tone, no lesions  Shanon Petty chaperoned for the exam.  A:  Well Woman with normal exam  Vaginal odor  P:   No pap this year  Mammogram overdue, she will schedule  Colonoscopy referral placed  Discussed breast self exam  Discussed calcium and vit D intake  Labs with primary  Nuswab for BV  Try vaginal moisutizer

## 2020-08-30 ENCOUNTER — Other Ambulatory Visit: Payer: Self-pay

## 2020-08-30 ENCOUNTER — Ambulatory Visit (INDEPENDENT_AMBULATORY_CARE_PROVIDER_SITE_OTHER): Payer: No Typology Code available for payment source | Admitting: Obstetrics and Gynecology

## 2020-08-30 ENCOUNTER — Encounter: Payer: Self-pay | Admitting: Obstetrics and Gynecology

## 2020-08-30 ENCOUNTER — Other Ambulatory Visit: Payer: Self-pay | Admitting: Obstetrics and Gynecology

## 2020-08-30 VITALS — BP 122/64 | HR 97 | Ht 64.0 in | Wt 178.0 lb

## 2020-08-30 DIAGNOSIS — Z01419 Encounter for gynecological examination (general) (routine) without abnormal findings: Secondary | ICD-10-CM

## 2020-08-30 DIAGNOSIS — Z1211 Encounter for screening for malignant neoplasm of colon: Secondary | ICD-10-CM | POA: Diagnosis not present

## 2020-08-30 DIAGNOSIS — N898 Other specified noninflammatory disorders of vagina: Secondary | ICD-10-CM | POA: Diagnosis not present

## 2020-08-30 MED ORDER — VALACYCLOVIR HCL 500 MG PO TABS: ORAL_TABLET | ORAL | 2 refills | Status: DC

## 2020-08-30 MED FILL — VALACYCLOVIR HCL 500 MG TAB: 500 | 4 days supply | Qty: 30 | Fill #0

## 2020-08-30 NOTE — Patient Instructions (Addendum)
Try Replens vaginal moisturizer.   EXERCISE AND DIET:  We recommended that you start or continue a regular exercise program for good health. Regular exercise means any activity that makes your heart beat faster and makes you sweat.  We recommend exercising at least 30 minutes per day at least 3 days a week, preferably 4 or 5.  We also recommend a diet low in fat and sugar.  Inactivity, poor dietary choices and obesity can cause diabetes, heart attack, stroke, and kidney damage, among others.    ALCOHOL AND SMOKING:  Women should limit their alcohol intake to no more than 7 drinks/beers/glasses of wine (combined, not each!) per week. Moderation of alcohol intake to this level decreases your risk of breast cancer and liver damage. And of course, no recreational drugs are part of a healthy lifestyle.  And absolutely no smoking or even second hand smoke. Most people know smoking can cause heart and lung diseases, but did you know it also contributes to weakening of your bones? Aging of your skin?  Yellowing of your teeth and nails?  CALCIUM AND VITAMIN D:  Adequate intake of calcium and Vitamin D are recommended.  The recommendations for exact amounts of these supplements seem to change often, but generally speaking 1,200 mg of calcium (between diet and supplement) and 800 units of Vitamin D per day seems prudent. Certain women may benefit from higher intake of Vitamin D.  If you are among these women, your doctor will have told you during your visit.    PAP SMEARS:  Pap smears, to check for cervical cancer or precancers,  have traditionally been done yearly, although recent scientific advances have shown that most women can have pap smears less often.  However, every woman still should have a physical exam from her gynecologist every year. It will include a breast check, inspection of the vulva and vagina to check for abnormal growths or skin changes, a visual exam of the cervix, and then an exam to evaluate  the size and shape of the uterus and ovaries.  And after 57 years of age, a rectal exam is indicated to check for rectal cancers. We will also provide age appropriate advice regarding health maintenance, like when you should have certain vaccines, screening for sexually transmitted diseases, bone density testing, colonoscopy, mammograms, etc.   MAMMOGRAMS:  All women over 40 years old should have a yearly mammogram. Many facilities now offer a "3D" mammogram, which may cost around $50 extra out of pocket. If possible,  we recommend you accept the option to have the 3D mammogram performed.  It both reduces the number of women who will be called back for extra views which then turn out to be normal, and it is better than the routine mammogram at detecting truly abnormal areas.    COLON CANCER SCREENING: Now recommend starting at age 45. At this time colonoscopy is not covered for routine screening until 50. There are take home tests that can be done between 45-49.   COLONOSCOPY:  Colonoscopy to screen for colon cancer is recommended for all women at age 50.  We know, you hate the idea of the prep.  We agree, BUT, having colon cancer and not knowing it is worse!!  Colon cancer so often starts as a polyp that can be seen and removed at colonscopy, which can quite literally save your life!  And if your first colonoscopy is normal and you have no family history of colon cancer, most women don't   have to have it again for 10 years.  Once every ten years, you can do something that may end up saving your life, right?  We will be happy to help you get it scheduled when you are ready.  Be sure to check your insurance coverage so you understand how much it will cost.  It may be covered as a preventative service at no cost, but you should check your particular policy.      Breast Self-Awareness Breast self-awareness means being familiar with how your breasts look and feel. It involves checking your breasts regularly  and reporting any changes to your health care provider. Practicing breast self-awareness is important. A change in your breasts can be a sign of a serious medical problem. Being familiar with how your breasts look and feel allows you to find any problems early, when treatment is more likely to be successful. All women should practice breast self-awareness, including women who have had breast implants. How to do a breast self-exam One way to learn what is normal for your breasts and whether your breasts are changing is to do a breast self-exam. To do a breast self-exam: Look for Changes  1. Remove all the clothing above your waist. 2. Stand in front of a mirror in a room with good lighting. 3. Put your hands on your hips. 4. Push your hands firmly downward. 5. Compare your breasts in the mirror. Look for differences between them (asymmetry), such as: ? Differences in shape. ? Differences in size. ? Puckers, dips, and bumps in one breast and not the other. 6. Look at each breast for changes in your skin, such as: ? Redness. ? Scaly areas. 7. Look for changes in your nipples, such as: ? Discharge. ? Bleeding. ? Dimpling. ? Redness. ? A change in position. Feel for Changes Carefully feel your breasts for lumps and changes. It is best to do this while lying on your back on the floor and again while sitting or standing in the shower or tub with soapy water on your skin. Feel each breast in the following way:  Place the arm on the side of the breast you are examining above your head.  Feel your breast with the other hand.  Start in the nipple area and make  inch (2 cm) overlapping circles to feel your breast. Use the pads of your three middle fingers to do this. Apply light pressure, then medium pressure, then firm pressure. The light pressure will allow you to feel the tissue closest to the skin. The medium pressure will allow you to feel the tissue that is a little deeper. The firm pressure  will allow you to feel the tissue close to the ribs.  Continue the overlapping circles, moving downward over the breast until you feel your ribs below your breast.  Move one finger-width toward the center of the body. Continue to use the  inch (2 cm) overlapping circles to feel your breast as you move slowly up toward your collarbone.  Continue the up and down exam using all three pressures until you reach your armpit.  Write Down What You Find  Write down what is normal for each breast and any changes that you find. Keep a written record with breast changes or normal findings for each breast. By writing this information down, you do not need to depend only on memory for size, tenderness, or location. Write down where you are in your menstrual cycle, if you are still menstruating. If   you are having trouble noticing differences in your breasts, do not get discouraged. With time you will become more familiar with the variations in your breasts and more comfortable with the exam. How often should I examine my breasts? Examine your breasts every month. If you are breastfeeding, the best time to examine your breasts is after a feeding or after using a breast pump. If you menstruate, the best time to examine your breasts is 5-7 days after your period is over. During your period, your breasts are lumpier, and it may be more difficult to notice changes. When should I see my health care provider? See your health care provider if you notice:  A change in shape or size of your breasts or nipples.  A change in the skin of your breast or nipples, such as a reddened or scaly area.  Unusual discharge from your nipples.  A lump or thick area that was not there before.  Pain in your breasts.  Anything that concerns you.  

## 2020-08-31 ENCOUNTER — Ambulatory Visit: Payer: No Typology Code available for payment source | Admitting: Physical Therapy

## 2020-08-31 ENCOUNTER — Encounter: Payer: Self-pay | Admitting: Physical Therapy

## 2020-08-31 DIAGNOSIS — M5412 Radiculopathy, cervical region: Secondary | ICD-10-CM

## 2020-08-31 MED FILL — VENLAFAXINE HCL ER 75 MG CA: 75 | 90 days supply | Qty: 90 | Fill #2

## 2020-09-03 LAB — BACTERIAL VAGINOSIS, NAA: Atopobium vaginae: HIGH Score — AB

## 2020-09-04 ENCOUNTER — Encounter: Payer: No Typology Code available for payment source | Admitting: Physical Therapy

## 2020-09-04 ENCOUNTER — Other Ambulatory Visit: Payer: Self-pay

## 2020-09-04 DIAGNOSIS — N760 Acute vaginitis: Secondary | ICD-10-CM

## 2020-09-04 DIAGNOSIS — B9689 Other specified bacterial agents as the cause of diseases classified elsewhere: Secondary | ICD-10-CM

## 2020-09-04 MED ORDER — METRONIDAZOLE 500 MG PO TABS: 500.0000 mg | ORAL_TABLET | Freq: Two times a day (BID) | ORAL | 0 refills | Status: DC

## 2020-09-04 MED FILL — metroNIDAZOLE 500 MG TABS: 500 | 7 days supply | Qty: 14 | Fill #0

## 2020-09-04 NOTE — Progress Notes (Signed)
Oral: Flagyl 500 mg BID x 7 days sent to [facility] outpatient pharmacy.

## 2020-09-07 ENCOUNTER — Ambulatory Visit: Payer: No Typology Code available for payment source | Admitting: Physical Therapy

## 2020-09-07 ENCOUNTER — Ambulatory Visit: Payer: No Typology Code available for payment source | Admitting: Family Medicine

## 2020-09-07 ENCOUNTER — Encounter: Payer: Self-pay | Admitting: Family Medicine

## 2020-09-07 ENCOUNTER — Other Ambulatory Visit: Payer: Self-pay

## 2020-09-07 VITALS — BP 110/90 | HR 85 | Ht 64.0 in | Wt 178.0 lb

## 2020-09-07 DIAGNOSIS — M5412 Radiculopathy, cervical region: Secondary | ICD-10-CM

## 2020-09-07 DIAGNOSIS — M542 Cervicalgia: Secondary | ICD-10-CM

## 2020-09-07 DIAGNOSIS — M999 Biomechanical lesion, unspecified: Secondary | ICD-10-CM | POA: Diagnosis not present

## 2020-09-07 NOTE — Progress Notes (Signed)
[facility] [address] [place] [address] Phone: [phone] Subjective:   I Molly Park am serving as a Neurosurgeon for Dr. Antoine Primas.  This visit occurred during the SARS-CoV-2 public health emergency.  Safety protocols were in place, including screening questions prior to the visit, additional usage of staff PPE, and extensive cleaning of exam room while observing appropriate contact time as indicated for disinfecting solutions.   I'm seeing this patient by the request  of:  Kuneff, Renee A, DO  CC: Neck pain follow-up.  [id number]  Molly Park is a 59 y.o. female coming in with complaint of back and neck pain. OMT 08/24/2020. Patient states she is better today. Neck is painful today but making progress.  Patient states still noticing tightness but no longer having any other radicular symptoms.  Prednisone helped out significantly.  Still using the muscle relaxer.  Did do dry needling which was also very helpful.  States she is approximately 70% better.  Medications patient has been prescribed: Zanaflex        Reviewed prior external information including notes and imaging from previsou exam, outside providers and external EMR if available.   As well as notes that were available from care everywhere and other healthcare systems.  Past medical history, social, surgical and family history all reviewed in electronic medical record.  No pertanent information unless stated regarding to the chief complaint.   Past Medical History:  Diagnosis Date  . Bilateral edema of lower extremity    tried on low dose lasix by prior provider  . Chicken pox   . Hemorrhoid   . HSV infection   . Hypothyroidism   . PONV (postoperative nausea and vomiting)   . Rotator cuff impingement syndrome of right shoulder     No Known Allergies   Review of Systems:  No headache, visual changes, nausea, vomiting, diarrhea, constipation, dizziness, abdominal  pain, skin rash, fevers, chills, night sweats, weight loss, swollen lymph nodes, body aches, joint swelling, chest pain, shortness of breath, mood changes. POSITIVE muscle aches  Objective  Blood pressure 110/90, pulse 85, height 5\' 4"  (1.626 m), weight 178 lb (80.7 kg), SpO2 93 %.   General: No apparent distress alert and oriented x3 mood and affect normal, dressed appropriately.  HEENT: Pupils equal, extraocular movements intact  Respiratory: Patient's speak in full sentences and does not appear short of breath  Cardiovascular: No lower extremity edema, non tender, no erythema  Neuro: Cranial nerves II through XII are intact, neurovascularly intact in all extremities with 2+ DTRs and 2+ pulses.  Gait normal with good balance and coordination.  MSK:  Non tender with full range of motion and good stability and symmetric strength and tone of shoulders, elbows, wrist, hip, knee and ankles bilaterally.  Neck exam shows the patient does have some very mild loss of lordosis.  Tightness noted in the left trapezius.  Negative Spurling's which is an improvement.  Patient does have some improvement in range of motion but lacks the last 5 degrees of sidebending to the left and extension.  Osteopathic findings  C6 flexed rotated and side bent left T3 extended rotated and side bent left inhaled rib       Assessment and Plan:  Cervical pain (neck) Patient is making progress.  Responding well to manipulation again.  Discussed posture and ergonomics, discussed avoiding certain activities.  Patient can continue gabapentin or the Zanaflex as needed.  Patient can continue physical therapy and dry  needling as needed.  Follow-up with me again in 6 to 8 weeks    Nonallopathic problems  Decision today to treat with OMT was based on Physical Exam  After verbal consent patient was treated with HVLA, ME, FPR techniques in cervical, rib, thoracic areas  Patient tolerated the procedure well with improvement  in symptoms  Patient given exercises, stretches and lifestyle modifications  See medications in patient instructions if given  Patient will follow up in 6-8 weeks      The above documentation has been reviewed and is accurate and complete Judi Saa, DO       Note: This dictation was prepared with Dragon dictation along with smaller phrase technology. Any transcriptional errors that result from this process are unintentional.

## 2020-09-07 NOTE — Patient Instructions (Signed)
Much better Keep doing exercises Use Lauren when you need her See me in 5-6 weeks

## 2020-09-07 NOTE — Assessment & Plan Note (Signed)
Patient is making progress.  Responding well to manipulation again.  Discussed posture and ergonomics, discussed avoiding certain activities.  Patient can continue gabapentin or the Zanaflex as needed.  Patient can continue physical therapy and dry needling as needed.  Follow-up with me again in 6 to 8 weeks

## 2020-09-14 ENCOUNTER — Encounter: Payer: Self-pay | Admitting: Physical Therapy

## 2020-09-14 NOTE — Therapy (Signed)
[facility] [address] [place], [place], [address] Phone: [phone]   Fax:  [phone]  Physical Therapy Evaluation  Patient Details  Name: Molly Park MRN: [id number] Date of Birth: 01-20-1961 Referring Provider (PT): Terrilee Files   Encounter Date: 08/31/2020   PT End of Session - 09/14/20 1400    Visit Number 1    Number of Visits 12    Date for PT Re-Evaluation 10/12/20    Authorization Type [facility]    PT Start Time 1517    PT Stop Time 1600    PT Time Calculation (min) 43 min    Activity Tolerance Patient tolerated treatment well    Behavior During Therapy [facility] for tasks assessed/performed           Past Medical History:  Diagnosis Date   Bilateral edema of lower extremity    tried on low dose lasix by prior provider   Chicken pox    Hemorrhoid    HSV infection    Hypothyroidism    PONV (postoperative nausea and vomiting)    Rotator cuff impingement syndrome of right shoulder     Past Surgical History:  Procedure Laterality Date   APPENDECTOMY  1973   BREAST LUMPECTOMY Right 2014   EXAM UNDER ANESTHESIA WITH MANIPULATION OF SHOULDER Right 01/17/2020   Procedure: Right shoulder manipulation under anesthesia;  Surgeon: Jones Broom, MD;  Location: [facility];  Service: Orthopedics;  Laterality: Right;   HEMORRHOID SURGERY     OPEN REDUCTION INTERNAL FIXATION (ORIF) DISTAL RADIAL FRACTURE Right 08/12/2019   Procedure: OPEN REDUCTION INTERNAL FIXATION (ORIF) DISTAL RADIAL FRACTURE;  Surgeon: Betha Loa, MD;  Location: [facility];  Service: Orthopedics;  Laterality: Right;   SHOULDER INJECTION Right 01/17/2020   Procedure: SHOULDER STEROID INJECTION;  Surgeon: Jones Broom, MD;  Location: [facility];  Service: Orthopedics;  Laterality: Right;   TUBAL LIGATION  2004    There were no vitals filed for this visit.    Subjective Assessment -  09/14/20 1400    Subjective Pt states increased pain in L side of neck, thinks she slept wrong, has been present for about 3 weeks. Is showing some improvments since seeing MD, and prednesone. Pt has been able to still work, but pain is bothersome. Headaches. Most pain into L cervical region and into shoulder blade    Limitations Lifting;House hold activities    Patient Stated Goals decreased pain    Currently in Pain? Yes    Pain Score 5     Pain Location Neck    Pain Orientation Left    Pain Descriptors / Indicators Aching;Tightness;Radiating    Pain Type Acute pain    Pain Onset 1 to 4 weeks ago    Pain Frequency Intermittent    Aggravating Factors  increased cervical ROM,  shoulder ROM,              [facility] PT Assessment - 09/14/20 0001      Assessment   Medical Diagnosis Cervical pain with radiculopathy    Referring Provider (PT) Terrilee Files    Hand Dominance Right    Prior Therapy no      Balance Screen   Has the patient fallen in the past 6 months No      Prior Function   Level of Independence Independent      Cognition   Overall Cognitive Status Within Functional Limits for tasks assessed  AROM   Overall AROM Comments Cervical: limited extension and limited L SB and ROT      Strength   Overall Strength Comments L UE: 4/5, R: 4+/5 :       Palpation   Palpation comment Soreness and tigtness in L UT, cervical paraspinals, levator into rhomboid,                       Objective measurements completed on examination: See above findings.       [facility] Adult PT Treatment/Exercise - 09/14/20 0001      Exercises   Exercises Neck      Neck Exercises: Seated   Other Seated Exercise scap retract x 20       Modalities   Modalities Moist Heat      Moist Heat Therapy   Number Minutes Moist Heat 10 Minutes    Moist Heat Location Cervical      Manual Therapy   Manual Therapy Joint mobilization;Soft tissue mobilization;Manual Traction    Manual  therapy comments skilled palpation and monitoring of soft tissue with dry needling     Joint Mobilization PA mobs cervical spine    Soft tissue mobilization STM/DTM bil cervical paraspinals, SO, UTs.     Manual Traction cervcial 10 sec x 10;             Trigger Point Dry Needling - 09/14/20 0001    Consent Given? Yes    Education Handout Provided Yes    Muscles Treated Head and Neck Upper trapezius;Levator scapulae    Upper Trapezius Response Twitch reponse elicited;Palpable increased muscle length    Levator Scapulae Response Twitch response elicited;Palpable increased muscle length   L                 PT Short Term Goals - 09/14/20 1406      PT SHORT TERM GOAL #1   Title Pt to be independent with initial HEP    Time 2    Period Weeks    Status New    Target Date 09/14/20             PT Long Term Goals - 09/14/20 1408      PT LONG TERM GOAL #1   Title Pt to be independent with final HEP    Time 6    Period Weeks    Status New    Target Date 10/12/20      PT LONG TERM GOAL #2   Title Pt to report decreased pain in cervcial region to 0-1/10 with activity    Time 6    Period Weeks    Status New    Target Date 10/12/20      PT LONG TERM GOAL #3   Title Pt to demo full cervical and shoulder ROM without pain, to improve ability for ADLs and IADLS.    Time 6    Period Weeks    Status New    Target Date 10/12/20      PT LONG TERM GOAL #4   Title Pt to demo soft tissue in neck and shoulder region to be WNL for decreased pain and function.    Time 6    Period Weeks    Status New    Target Date 10/12/20                  Plan - 09/14/20 1410    Clinical Impression Statement Pt presents with primary complaint  of increased pain in L sided cervcial region, into L UE. Pt with signficant muscle tension in same region, and painful cervical and shoulder ROM. Pt with decreased ability for full functional activiteis due to pain and dysfunction. Pt to  benefit from skilled PT to improve deficits and pain.    Examination-Activity Limitations Reach Overhead;Carry;Sleep;Lift    Examination-Participation Restrictions Meal Prep;Cleaning;Occupation;Community Activity;Driving;Yard Work    Stability/Clinical Decision Making Stable/Uncomplicated    Clinical Decision Making Low    Rehab Potential Good    PT Frequency 2x / week    PT Duration 6 weeks    PT Treatment/Interventions ADLs/Self Care Home Management;Cryotherapy;Electrical Stimulation;Iontophoresis 4mg /ml Dexamethasone;Moist Heat;Traction;Ultrasound;Neuromuscular re-education;Therapeutic exercise;Therapeutic activities;Functional mobility training;Patient/family education;Manual techniques;Taping;Dry needling;Passive range of motion;Spinal Manipulations;Joint Manipulations    Consulted and Agree with Plan of Care Patient           Patient will benefit from skilled therapeutic intervention in order to improve the following deficits and impairments:  Decreased range of motion, Increased muscle spasms, Decreased activity tolerance, Pain, Impaired flexibility, Decreased mobility, Decreased strength, Improper body mechanics  Visit Diagnosis: Radiculopathy, cervical region     Problem List Patient Active Problem List   Diagnosis Date Noted   Cervical pain (neck) 08/24/2020   Nonallopathic lesion of cervical region 08/24/2020   Closed fracture of lower end of right radius with routine healing 08/18/2019   Hot flashes 07/22/2018   Elevated hemoglobin A1c 07/22/2018   Dysphagia 07/22/2018   Lumbar pain 01/14/2018   Mass 12/09/2017   Encounter for preventive health examination 01/14/2017   Hypothyroidism 01/14/2017   Vitamin D deficiency 01/14/2017   03/16/2017, PT, DPT 2:17 PM  09/14/20    [facility] [address] [place], [place], [place] Phone: [phone]   Fax:  [phone]  Name: Molly Park MRN:  Amedeo Kinsman Date of Birth: 1961-09-12

## 2020-09-14 NOTE — Therapy (Addendum)
[facility] [address] [place], [place], [address] Phone: [phone]   Fax:  [phone]  Physical Therapy Treatment  Patient Details  Name: Molly Park MRN: [id number] Date of Birth: 09-20-1961 Referring Provider (PT): Charlann Boxer   Encounter Date: 09/07/2020   PT End of Session - 09/14/20 1419    Visit Number 2    Number of Visits 12    Date for PT Re-Evaluation 10/12/20    Authorization Type [facility]    PT Start Time 1600    PT Stop Time 1644    PT Time Calculation (min) 44 min    Activity Tolerance Patient tolerated treatment well    Behavior During Therapy [facility] for tasks assessed/performed           Past Medical History:  Diagnosis Date  . Bilateral edema of lower extremity    tried on low dose lasix by prior provider  . Chicken pox   . Hemorrhoid   . HSV infection   . Hypothyroidism   . PONV (postoperative nausea and vomiting)   . Rotator cuff impingement syndrome of right shoulder     Past Surgical History:  Procedure Laterality Date  . APPENDECTOMY  1973  . BREAST LUMPECTOMY Right 2014  . EXAM UNDER ANESTHESIA WITH MANIPULATION OF SHOULDER Right 01/17/2020   Procedure: Right shoulder manipulation under anesthesia;  Surgeon: Tania Ade, MD;  Location: [facility];  Service: Orthopedics;  Laterality: Right;  . HEMORRHOID SURGERY    . OPEN REDUCTION INTERNAL FIXATION (ORIF) DISTAL RADIAL FRACTURE Right 08/12/2019   Procedure: OPEN REDUCTION INTERNAL FIXATION (ORIF) DISTAL RADIAL FRACTURE;  Surgeon: Leanora Cover, MD;  Location: [facility];  Service: Orthopedics;  Laterality: Right;  . SHOULDER INJECTION Right 01/17/2020   Procedure: SHOULDER STEROID INJECTION;  Surgeon: Tania Ade, MD;  Location: [facility];  Service: Orthopedics;  Laterality: Right;  . TUBAL LIGATION  2004    There were no vitals filed for this visit.   Subjective Assessment - 09/14/20  1419    Subjective Pt states improving pain    Currently in Pain? Yes    Pain Location Neck    Pain Orientation Left    Pain Descriptors / Indicators Aching;Tightness    Pain Type Acute pain    Pain Onset More than a month ago    Pain Frequency Intermittent                             [facility] Adult PT Treatment/Exercise - 09/14/20 1425      Exercises   Exercises Neck      Neck Exercises: Theraband   Rows 20 reps;Green    Shoulder External Rotation 20 reps;Red    Horizontal ABduction 20 reps;Red      Neck Exercises: Standing   Other Standing Exercises shoulder flexion x 15 AROM       Neck Exercises: Seated   Other Seated Exercise --      Modalities   Modalities Moist Heat      Moist Heat Therapy   Moist Heat Location --      Manual Therapy   Manual Therapy Joint mobilization;Soft tissue mobilization;Manual Traction    Manual therapy comments skilled palpation and monitoring of soft tissue with dry needling     Joint Mobilization PA mobs cervical spine    Soft tissue mobilization STM/DTM bil cervical paraspinals, SO, UTs.  Manual Traction cervcial 10 sec x 10;       Neck Exercises: Stretches   Upper Trapezius Stretch 3 reps;30 seconds    Levator Stretch 3 reps;30 seconds            Trigger Point Dry Needling - 09/14/20 1426    Consent Given? Yes    Education Handout Provided Previously provided    Muscles Treated Head and Neck Upper trapezius;Levator scapulae    Upper Trapezius Response Twitch reponse elicited;Palpable increased muscle length   Bil   Levator Scapulae Response Twitch response elicited;Palpable increased muscle length   L                 PT Short Term Goals - 09/14/20 1406      PT SHORT TERM GOAL #1   Title Pt to be independent with initial HEP    Time 2    Period Weeks    Status New    Target Date 09/14/20             PT Long Term Goals - 09/14/20 1408      PT LONG TERM GOAL #1   Title Pt to be  independent with final HEP    Time 6    Period Weeks    Status New    Target Date 10/12/20      PT LONG TERM GOAL #2   Title Pt to report decreased pain in cervcial region to 0-1/10 with activity    Time 6    Period Weeks    Status New    Target Date 10/12/20      PT LONG TERM GOAL #3   Title Pt to demo full cervical and shoulder ROM without pain, to improve ability for ADLs and IADLS.    Time 6    Period Weeks    Status New    Target Date 10/12/20      PT LONG TERM GOAL #4   Title Pt to demo soft tissue in neck and shoulder region to be WNL for decreased pain and function.    Time 6    Period Weeks    Status New    Target Date 10/12/20                 Plan - 09/14/20 1423    Clinical Impression Statement Pt with mid pain improvments today. Continues to have muscle tightness and mild soreness. Dry needling repeated today, bilaterally for decreased muscle tension. Pt with improving ROM and ability for activity withdecreasing pain.    Examination-Activity Limitations Reach Overhead;Carry;Sleep;Lift    Examination-Participation Restrictions Meal Prep;Cleaning;Occupation;Community Activity;Driving;Yard Work    Stability/Clinical Decision Making Stable/Uncomplicated    Rehab Potential Good    PT Frequency 2x / week    PT Duration 6 weeks    PT Treatment/Interventions ADLs/Self Care Home Management;Cryotherapy;Electrical Stimulation;Iontophoresis 38m/ml Dexamethasone;Moist Heat;Traction;Ultrasound;Neuromuscular re-education;Therapeutic exercise;Therapeutic activities;Functional mobility training;Patient/family education;Manual techniques;Taping;Dry needling;Passive range of motion;Spinal Manipulations;Joint Manipulations    Consulted and Agree with Plan of Care Patient           Patient will benefit from skilled therapeutic intervention in order to improve the following deficits and impairments:  Decreased range of motion, Increased muscle spasms, Decreased activity  tolerance, Pain, Impaired flexibility, Decreased mobility, Decreased strength, Improper body mechanics  Visit Diagnosis: Radiculopathy, cervical region     Problem List Patient Active Problem List   Diagnosis Date Noted  . Cervical pain (neck) 08/24/2020  . Nonallopathic lesion of cervical region  08/24/2020  . Closed fracture of lower end of right radius with routine healing 08/18/2019  . Hot flashes 07/22/2018  . Elevated hemoglobin A1c 07/22/2018  . Dysphagia 07/22/2018  . Lumbar pain 01/14/2018  . Mass 12/09/2017  . Encounter for preventive health examination 01/14/2017  . Hypothyroidism 01/14/2017  . Vitamin D deficiency 01/14/2017   Lyndee Hensen, PT, DPT 2:26 PM  09/14/20    [facility] [place], [place], [address] Phone: [phone]   Fax:  [phone]  Name: Molly Park MRN: [id number] Date of Birth: 01/27/1961   PHYSICAL THERAPY DISCHARGE SUMMARY  Visits from Start of Care: 2 Plan: Patient agrees to discharge.  Patient goals were met. Patient is being discharged due to meeting the stated rehab goals.  ?????     Lyndee Hensen, PT, DPT 1:36 PM  03/06/21

## 2020-09-15 MED FILL — tiZANidine HCL 4 MG TABS: 4 | 8 days supply | Qty: 30 | Fill #1

## 2020-09-15 MED FILL — DICLOFENAC SOD ER 100 MG TA: 100 | 90 days supply | Qty: 90 | Fill #2

## 2020-10-12 ENCOUNTER — Ambulatory Visit: Payer: No Typology Code available for payment source | Admitting: Family Medicine

## 2020-10-12 NOTE — Progress Notes (Deleted)
  [facility] [address] [place] [address] Phone: [phone] Subjective:    I'm seeing this patient by the request  of:  Kuneff, Renee A, DO  CC:   PPI:[id number]  Molly Park is a 59 y.o. female coming in with complaint of back and neck pain. OMT 09/07/2020. Patient states   Medications patient has been prescribed: None Taking:         Reviewed prior external information including notes and imaging from previsou exam, outside providers and external EMR if available.   As well as notes that were available from care everywhere and other healthcare systems.  Past medical history, social, surgical and family history all reviewed in electronic medical record.  No pertanent information unless stated regarding to the chief complaint.   Past Medical History:  Diagnosis Date  . Bilateral edema of lower extremity    tried on low dose lasix by prior provider  . Chicken pox   . Hemorrhoid   . HSV infection   . Hypothyroidism   . PONV (postoperative nausea and vomiting)   . Rotator cuff impingement syndrome of right shoulder     No Known Allergies   Review of Systems:  No headache, visual changes, nausea, vomiting, diarrhea, constipation, dizziness, abdominal pain, skin rash, fevers, chills, night sweats, weight loss, swollen lymph nodes, body aches, joint swelling, chest pain, shortness of breath, mood changes. POSITIVE muscle aches  Objective  There were no vitals taken for this visit.   General: No apparent distress alert and oriented x3 mood and affect normal, dressed appropriately.  HEENT: Pupils equal, extraocular movements intact  Respiratory: Patient's speak in full sentences and does not appear short of breath  Cardiovascular: No lower extremity edema, non tender, no erythema  Neuro: Cranial nerves II through XII are intact, neurovascularly intact in all extremities with 2+ DTRs and 2+ pulses.  Gait normal with good balance  and coordination.  MSK:  Non tender with full range of motion and good stability and symmetric strength and tone of shoulders, elbows, wrist, hip, knee and ankles bilaterally.  Back - Normal skin, Spine with normal alignment and no deformity.  No tenderness to vertebral process palpation.  Paraspinous muscles are not tender and without spasm.   Range of motion is full at neck and lumbar sacral regions  Osteopathic findings  C2 flexed rotated and side bent right C6 flexed rotated and side bent left T3 extended rotated and side bent right inhaled rib T9 extended rotated and side bent left L2 flexed rotated and side bent right Sacrum right on right       Assessment and Plan:    Nonallopathic problems  Decision today to treat with OMT was based on Physical Exam  After verbal consent patient was treated with HVLA, ME, FPR techniques in cervical, rib, thoracic, lumbar, and sacral  areas  Patient tolerated the procedure well with improvement in symptoms  Patient given exercises, stretches and lifestyle modifications  See medications in patient instructions if given  Patient will follow up in 4-8 weeks      The above documentation has been reviewed and is accurate and complete Wilford Grist       Note: This dictation was prepared with Dragon dictation along with smaller phrase technology. Any transcriptional errors that result from this process are unintentional.

## 2020-10-16 MED FILL — SYNTHROID 100 MCG TABLET: 100 | 90 days supply | Qty: 90 | Fill #2

## 2020-11-01 MED FILL — ATORVASTATIN CALCIUM 20 MG: 20 | 90 days supply | Qty: 90 | Fill #3

## 2020-12-01 MED FILL — VENLAFAXINE HCL ER 75 MG CA: 75 | 90 days supply | Qty: 90 | Fill #3

## 2020-12-14 MED FILL — DICLOFENAC SOD ER 100 MG TA: 100 | 90 days supply | Qty: 90 | Fill #3

## 2021-01-09 MED FILL — SYNTHROID 100 MCG TABLET: 100 | 90 days supply | Qty: 90 | Fill #3

## 2021-01-18 ENCOUNTER — Other Ambulatory Visit: Payer: Self-pay | Admitting: Family Medicine

## 2021-01-18 DIAGNOSIS — Z1231 Encounter for screening mammogram for malignant neoplasm of breast: Secondary | ICD-10-CM

## 2021-01-25 ENCOUNTER — Ambulatory Visit
Admission: RE | Admit: 2021-01-25 | Discharge: 2021-01-25 | Disposition: A | Discharge status: Home / Self Care | Payer: No Typology Code available for payment source | Source: Ambulatory Visit | Attending: Family Medicine | Admitting: Family Medicine

## 2021-01-25 ENCOUNTER — Ambulatory Visit: Payer: No Typology Code available for payment source

## 2021-01-25 ENCOUNTER — Other Ambulatory Visit: Payer: Self-pay

## 2021-01-25 DIAGNOSIS — Z1231 Encounter for screening mammogram for malignant neoplasm of breast: Secondary | ICD-10-CM

## 2021-01-29 ENCOUNTER — Other Ambulatory Visit: Payer: Self-pay | Admitting: Family Medicine

## 2021-01-29 DIAGNOSIS — R928 Other abnormal and inconclusive findings on diagnostic imaging of breast: Secondary | ICD-10-CM

## 2021-01-30 ENCOUNTER — Telehealth: Payer: Self-pay

## 2021-01-30 NOTE — Telephone Encounter (Signed)
Mammogram fax received for Breast US order. Appt sched for 4/8  Will place on PCP desk

## 2021-01-30 NOTE — Telephone Encounter (Signed)
Signed and returned paper copies.  Also signed electronic copies requested yesterday.

## 2021-01-30 NOTE — Telephone Encounter (Signed)
faxed

## 2021-02-06 ENCOUNTER — Telehealth: Payer: Self-pay

## 2021-02-06 ENCOUNTER — Other Ambulatory Visit (HOSPITAL_BASED_OUTPATIENT_CLINIC_OR_DEPARTMENT_OTHER): Payer: Self-pay

## 2021-02-06 MED ORDER — ATORVASTATIN CALCIUM 20 MG PO TABS
20.0000 mg | ORAL_TABLET | Freq: Every day | ORAL | 0 refills | Status: DC
  Filled 2021-02-06: qty 30, 30d supply, fill #0

## 2021-02-06 NOTE — Telephone Encounter (Signed)
  LAST APPOINTMENT DATE: 01/30/2021   NEXT APPOINTMENT DATE:@Visit  date not found  MEDICATION:atorvastatin (LIPITOR) 20 MG tablet  PHARMACY: [facility] : FAX: [phone]   Comments: Patient is completely out and is switching pharmacies.

## 2021-02-06 NOTE — Telephone Encounter (Signed)
Rx sent for 30 d/s. Pt needs ov appt.

## 2021-02-10 ENCOUNTER — Encounter: Payer: Self-pay | Admitting: Family Medicine

## 2021-02-13 ENCOUNTER — Ambulatory Visit: Payer: No Typology Code available for payment source

## 2021-02-13 ENCOUNTER — Encounter: Payer: Self-pay | Admitting: Family Medicine

## 2021-02-13 ENCOUNTER — Telehealth (INDEPENDENT_AMBULATORY_CARE_PROVIDER_SITE_OTHER): Payer: No Typology Code available for payment source | Admitting: Family Medicine

## 2021-02-13 ENCOUNTER — Other Ambulatory Visit: Payer: Self-pay

## 2021-02-13 ENCOUNTER — Other Ambulatory Visit (HOSPITAL_BASED_OUTPATIENT_CLINIC_OR_DEPARTMENT_OTHER): Payer: Self-pay

## 2021-02-13 DIAGNOSIS — E559 Vitamin D deficiency, unspecified: Secondary | ICD-10-CM

## 2021-02-13 DIAGNOSIS — E039 Hypothyroidism, unspecified: Secondary | ICD-10-CM

## 2021-02-13 DIAGNOSIS — R7309 Other abnormal glucose: Secondary | ICD-10-CM

## 2021-02-13 DIAGNOSIS — R232 Flushing: Secondary | ICD-10-CM

## 2021-02-13 DIAGNOSIS — R5383 Other fatigue: Secondary | ICD-10-CM

## 2021-02-13 DIAGNOSIS — K2900 Acute gastritis without bleeding: Secondary | ICD-10-CM

## 2021-02-13 DIAGNOSIS — R4189 Other symptoms and signs involving cognitive functions and awareness: Secondary | ICD-10-CM | POA: Diagnosis not present

## 2021-02-13 DIAGNOSIS — Z8616 Personal history of COVID-19: Secondary | ICD-10-CM

## 2021-02-13 MED ORDER — OMEPRAZOLE 40 MG PO CPDR
40.0000 mg | DELAYED_RELEASE_CAPSULE | Freq: Every day | ORAL | 1 refills | Status: DC
  Filled 2021-02-13: qty 90, 90d supply, fill #0
  Filled 2021-05-24: qty 90, 90d supply, fill #1

## 2021-02-13 NOTE — Progress Notes (Signed)
VIRTUAL VISIT VIA VIDEO  I connected with Molly Park on 02/15/21 at  1:30 PM EDT by elemedicine application and verified that I am speaking with the correct person using two identifiers. Location patient: Home Location provider: [facility] [place], Office Persons participating in the virtual visit: Patient, Dr. Claiborne Billings and Reece Agar. Cesar, CMA  I discussed the limitations of evaluation and management by telemedicine and the availability of in person appointments. The patient expressed understanding and agreed to proceed.   SUBJECTIVE Chief Complaint  Patient presents with  . Fatigue    Pt c/o inc fatigue, upset stomach after eating and mild "fogginess", trouble forming words x 5-6 mos but worsen in last couple weeks; pt did have COVID in Dec;    HPI: Molly Park is a 60 y.o. present for complaints of  Fatigue and upset stomach.  She reports she has had stomach upset after eating.  This is occurred for the last few months ever since she had Covid.  She did have GI symptoms with her COVID infection.  She is currently not on a proton pump inhibitor or taking OTC medications.  She also complains of feeling fatigued with trouble concentrating.  She reports she feels foggy and is becoming more forgetful including word finding difficulties.  This has been worsening ever since her COVID but more so over the last few weeks.  Denies any recent medication changes or dietary changes.  She does have gabapentin on her med list but states she rarely takes that medication. ROS: See pertinent positives and negatives per HPI.  She is prescribed Effexor for her hot flashes.  She reports she has continued the Effexor 75 mg daily.  Her hot flashes are still present.  She does have some mild sexual side effects to this medication with lack of libido.  She has a thyroid disorder, and reports compliance with Synthroid 100 mcg daily.  He does continue to take the diclofenac 100 mg daily for her chronic  discomfort of her neck and back. Patient Active Problem List   Diagnosis Date Noted  . History of COVID-19 02/15/2021  . Gastritis 02/15/2021  . Nonallopathic lesion of cervical region 08/24/2020  . Hot flashes 07/22/2018  . Elevated hemoglobin A1c 07/22/2018  . Lumbar pain 01/14/2018  . Encounter for preventive health examination 01/14/2017  . Hypothyroidism 01/14/2017  . Vitamin D deficiency 01/14/2017    Social History   Tobacco Use  . Smoking status: Never Smoker  . Smokeless tobacco: Never Used  Substance Use Topics  . Alcohol use: Yes    Alcohol/week: 1.0 standard drink    Types: 1 Glasses of wine per week    Comment: social    Current Outpatient Medications:  .  Cholecalciferol (VITAMIN D) 50 MCG (2000 UT) tablet, Take 2,000 Units by mouth daily., Disp: , Rfl:  .  gabapentin (NEURONTIN) 300 MG capsule, TAKE 1 CAPSULE (300 MG TOTAL) BY MOUTH 3 (THREE) TIMES DAILY AS NEEDED (NERVE PAIN)., Disp: 90 capsule, Rfl: 3 .  omeprazole (PRILOSEC) 40 MG capsule, Take 1 capsule (40 mg total) by mouth daily., Disp: 90 capsule, Rfl: 1 .  valACYclovir (VALTREX) 500 MG tablet, FOR ORAL HSV: TAKE 4 TABLETS BY MOUTH TWICE DAILY FOR 1 DAY. FOR GENITAL HSV: TAKE 1 TAB TWICE DAILY FOR 3 DAYS., Disp: 30 tablet, Rfl: 2 .  atorvastatin (LIPITOR) 20 MG tablet, Take 1 tablet (20 mg total) by mouth daily., Disp: 90 tablet, Rfl: 3 .  Diclofenac Sodium CR 100 MG 24  hr tablet, TAKE 1 TABLET (100 MG TOTAL) BY MOUTH DAILY., Disp: 90 tablet, Rfl: 3 .  SYNTHROID 100 MCG tablet, TAKE 1 TABLET (100 MCG TOTAL) BY MOUTH DAILY BEFORE BREAKFAST., Disp: 90 tablet, Rfl: 3 .  venlafaxine XR (EFFEXOR XR) 150 MG 24 hr capsule, Take 1 capsule (150 mg total) by mouth daily with breakfast., Disp: 90 capsule, Rfl: 1  No Known Allergies  OBJECTIVE: LMP  (LMP Unknown)  Gen: No acute distress. Nontoxic in appearance.  HENT: AT. [place].  MMM.  Eyes:Pupils Equal Round Reactive to light, Extraocular movements intact,   Conjunctiva without redness, discharge or icterus. Chest: Cough not present.  No hoarseness Skin: No rashes, purpura or petechiae.  Neuro: Alert. Oriented x3  Psych: Normal affect and demeanor. Normal speech. Normal thought content and judgment.  ASSESSMENT AND PLAN: Molly Park is a 60 y.o. female present for  Hypothyroidism, unspecified type Ensure thyroid supplementation is adequate with reports of fatigue and cognitive changes.  If thyroid/TSH is normal will refill levothyroxine 100 mcg daily. - TSH; Future  Vitamin D deficiency Rule out vitamin D deficiency as cause of her fatigue with her history - VITAMIN D 25 Hydroxy (Vit-D Deficiency, Fractures); Future  Elevated hemoglobin A1c Rule out diabetes as cause of her cognitive change and fatigue with her history of elevated A1c - Hemoglobin A1c; Future  fatigue/cognitive change/history of Covid Discussed possible causes of fatigue and cognitive change.  Possibly could be related to her Covid infection in December. - Iron, TIBC and Ferritin Panel; Future - VITAMIN D 25 Hydroxy (Vit-D Deficiency, Fractures); Future - Vitamin B12; Future - TSH; Future - CBC with Differential/Platelet; Future She declined neurological referral today. If labs are normal and medication started below is not helpful, encourage patient to follow-up in person for further evaluation including MRI of brain.  Hot flashes Increase Effexor 75 mg to 150 mg for better coverage of hot flashes in possible assistance in her fatigue/memory deficit.  Abdominal discomfort: Symptoms seem consistent with gastritis.  Possibly related to her Covid infection in December since that is approximately 1 onset occurred and she had GI symptoms with her COVID. Start omeprazole 40 mg daily for 3 months.  Then taper off if able. If not improved with medication encouraged her to follow-up in person for further evaluation.   Felix Pacini, DO 02/15/2021    Orders Placed  This Encounter  Procedures  . Iron, TIBC and Ferritin Panel  . Hemoglobin A1c  . VITAMIN D 25 Hydroxy (Vit-D Deficiency, Fractures)  . Vitamin B12  . TSH  . CBC with Differential/Platelet   Meds ordered this encounter  Medications  . omeprazole (PRILOSEC) 40 MG capsule    Sig: Take 1 capsule (40 mg total) by mouth daily.    Dispense:  90 capsule    Refill:  1   Referral Orders  No referral(s) requested today

## 2021-02-14 ENCOUNTER — Telehealth: Payer: Self-pay | Admitting: Family Medicine

## 2021-02-14 DIAGNOSIS — E039 Hypothyroidism, unspecified: Secondary | ICD-10-CM

## 2021-02-14 DIAGNOSIS — M545 Low back pain, unspecified: Secondary | ICD-10-CM

## 2021-02-14 LAB — CBC WITH DIFFERENTIAL/PLATELET
Absolute Monocytes: 648 cells/uL (ref 200–950)
Basophils Absolute: 29 cells/uL (ref 0–200)
Basophils Relative: 0.4 %
Eosinophils Absolute: 187 cells/uL (ref 15–500)
Eosinophils Relative: 2.6 %
HCT: 37 % (ref 35.0–45.0)
Hemoglobin: 12.1 g/dL (ref 11.7–15.5)
Lymphs Abs: 2578 cells/uL (ref 850–3900)
MCH: 28.1 pg (ref 27.0–33.0)
MCHC: 32.7 g/dL (ref 32.0–36.0)
MCV: 86 fL (ref 80.0–100.0)
MPV: 11 fL (ref 7.5–12.5)
Monocytes Relative: 9 %
Neutro Abs: 3758 cells/uL (ref 1500–7800)
Neutrophils Relative %: 52.2 %
Platelets: 393 10*3/uL (ref 140–400)
RBC: 4.3 10*6/uL (ref 3.80–5.10)
RDW: 13 % (ref 11.0–15.0)
Total Lymphocyte: 35.8 %
WBC: 7.2 10*3/uL (ref 3.8–10.8)

## 2021-02-14 LAB — IRON,TIBC AND FERRITIN PANEL
%SAT: 21 % (calc) (ref 16–45)
Ferritin: 30 ng/mL (ref 16–232)
Iron: 74 ug/dL (ref 45–160)
TIBC: 356 mcg/dL (calc) (ref 250–450)

## 2021-02-14 LAB — TSH: TSH: 0.43 mIU/L (ref 0.40–4.50)

## 2021-02-14 LAB — HEMOGLOBIN A1C
Hgb A1c MFr Bld: 5.9 % of total Hgb — ABNORMAL HIGH (ref <5.7)
Mean Plasma Glucose: 123 mg/dL
eAG (mmol/L): 6.8 mmol/L

## 2021-02-14 LAB — VITAMIN D 25 HYDROXY (VIT D DEFICIENCY, FRACTURES): Vit D, 25-Hydroxy: 32 ng/mL (ref 30–100)

## 2021-02-14 LAB — VITAMIN B12: Vitamin B-12: 430 pg/mL (ref 200–1100)

## 2021-02-14 NOTE — Telephone Encounter (Signed)
Increasing gabapentin would not be an option and may cause her fatigue and lack of focus to be worse.  Increasing Effexor can certainly negatively impact her sex drive.  Other option is to try switching from Effexor to Wellbutrin, which has less effect on sex drive and can help with focus- problem is Wellbutrin dose not help with hot flashes.   If she does not desire referral to Neuro. If symptoms continue, We would need to follow up and pursue further work up for autoimmune disorders or neurological disorder, which usually requires imaging of the brain.

## 2021-02-14 NOTE — Telephone Encounter (Signed)
Please inform patient Her labs do not indicate a cause for her current symptoms. Iron panel is normal. Vitamin D is normal. B12 is low normal.  She could consider adding a sublingual 1000 mcg of B12 daily.  This may provide her with a little bit of energy. Blood cell counts are normal Thyroid is functioning normal Hemoglobin A1c is 5.9 which is improved from prior.  Encouraged her to start the omeprazole, as we discussed for her stomach symptoms. Start the B12, as recommended above.  We could consider increasing the Effexor to see if that helps with her focus and energy as well.  The other option is consider referral to neurology further recommendations.  Please advise on patient's decision.   Thanks

## 2021-02-14 NOTE — Telephone Encounter (Signed)
Spoke with pt regarding labs and instructions. Pt declined referral. Pt would like to know if she inc the Effexor would that effect her sex drive, which is already an issue at the moment. If prior statement is possible can gabapentin inc be an option.

## 2021-02-15 ENCOUNTER — Encounter: Payer: Self-pay | Admitting: Family Medicine

## 2021-02-15 ENCOUNTER — Other Ambulatory Visit (HOSPITAL_BASED_OUTPATIENT_CLINIC_OR_DEPARTMENT_OTHER): Payer: Self-pay

## 2021-02-15 DIAGNOSIS — Z8616 Personal history of COVID-19: Secondary | ICD-10-CM | POA: Insufficient documentation

## 2021-02-15 DIAGNOSIS — K2970 Gastritis, unspecified, without bleeding: Secondary | ICD-10-CM | POA: Insufficient documentation

## 2021-02-15 HISTORY — DX: Personal history of COVID-19: Z86.16

## 2021-02-15 MED ORDER — DICLOFENAC SODIUM ER 100 MG PO TB24
ORAL_TABLET | Freq: Every day | ORAL | 3 refills | Status: DC
  Filled 2021-02-15: qty 90, fill #0
  Filled 2021-03-09: qty 90, 90d supply, fill #0
  Filled 2021-06-08: qty 90, 90d supply, fill #1

## 2021-02-15 MED ORDER — SYNTHROID 100 MCG PO TABS
ORAL_TABLET | ORAL | 3 refills | Status: DC
  Filled 2021-02-15 – 2021-04-19 (×4): qty 90, 90d supply, fill #0
  Filled 2021-07-25: qty 90, 90d supply, fill #1

## 2021-02-15 MED ORDER — VENLAFAXINE HCL ER 150 MG PO CP24
150.0000 mg | ORAL_CAPSULE | Freq: Every day | ORAL | 1 refills | Status: DC
  Filled 2021-02-15: qty 90, 90d supply, fill #0
  Filled 2021-05-24: qty 90, 90d supply, fill #1

## 2021-02-15 MED ORDER — ATORVASTATIN CALCIUM 20 MG PO TABS
20.0000 mg | ORAL_TABLET | Freq: Every day | ORAL | 3 refills | Status: DC
  Filled 2021-02-15 – 2021-03-09 (×2): qty 90, 90d supply, fill #0
  Filled 2021-06-08: qty 90, 90d supply, fill #1
  Filled 2021-09-05: qty 90, 90d supply, fill #2
  Filled 2021-12-11: qty 90, 90d supply, fill #3

## 2021-02-15 NOTE — Addendum Note (Signed)
Addended by: Felix Pacini A on: 02/15/2021 08:28 AM   Modules accepted: Orders

## 2021-02-15 NOTE — Telephone Encounter (Signed)
Spoke with pt yesterday before phones were down to informed her providers response to questions. Pt stated that she will like to inc the Effexor and will monitor for the possible side effect. Pt also asked about if she could start the change of rx prior to mammogram.   Spoke with Dr. Claiborne Billings verbally about pt question and she stated that pt can start new meds. I will call pt later in day.   Please advise on inc med.

## 2021-02-15 NOTE — Telephone Encounter (Signed)
Spoke with pt regarding rx and following questions

## 2021-02-15 NOTE — Telephone Encounter (Signed)
Called in the higher dose of Effexor 150 mg daily.

## 2021-02-16 ENCOUNTER — Ambulatory Visit
Admission: RE | Admit: 2021-02-16 | Discharge: 2021-02-16 | Disposition: A | Discharge status: Home / Self Care | Payer: No Typology Code available for payment source | Source: Ambulatory Visit | Attending: Family Medicine | Admitting: Family Medicine

## 2021-02-16 ENCOUNTER — Other Ambulatory Visit: Payer: Self-pay | Admitting: Family Medicine

## 2021-02-16 ENCOUNTER — Other Ambulatory Visit: Payer: Self-pay

## 2021-02-16 DIAGNOSIS — R928 Other abnormal and inconclusive findings on diagnostic imaging of breast: Secondary | ICD-10-CM

## 2021-02-19 ENCOUNTER — Other Ambulatory Visit (HOSPITAL_BASED_OUTPATIENT_CLINIC_OR_DEPARTMENT_OTHER): Payer: Self-pay

## 2021-02-20 ENCOUNTER — Other Ambulatory Visit (HOSPITAL_BASED_OUTPATIENT_CLINIC_OR_DEPARTMENT_OTHER): Payer: Self-pay

## 2021-02-20 ENCOUNTER — Other Ambulatory Visit: Payer: Self-pay

## 2021-02-21 ENCOUNTER — Ambulatory Visit (INDEPENDENT_AMBULATORY_CARE_PROVIDER_SITE_OTHER): Payer: No Typology Code available for payment source | Admitting: Family Medicine

## 2021-02-21 ENCOUNTER — Other Ambulatory Visit (HOSPITAL_BASED_OUTPATIENT_CLINIC_OR_DEPARTMENT_OTHER): Payer: Self-pay

## 2021-02-21 ENCOUNTER — Encounter: Payer: Self-pay | Admitting: Family Medicine

## 2021-02-21 VITALS — BP 106/70 | HR 76 | Temp 98.0°F | Ht 64.0 in | Wt 177.0 lb

## 2021-02-21 DIAGNOSIS — E782 Mixed hyperlipidemia: Secondary | ICD-10-CM | POA: Diagnosis not present

## 2021-02-21 DIAGNOSIS — Z0000 Encounter for general adult medical examination without abnormal findings: Secondary | ICD-10-CM

## 2021-02-21 DIAGNOSIS — E039 Hypothyroidism, unspecified: Secondary | ICD-10-CM | POA: Diagnosis not present

## 2021-02-21 DIAGNOSIS — Z5320 Procedure and treatment not carried out because of patient's decision for unspecified reasons: Secondary | ICD-10-CM | POA: Insufficient documentation

## 2021-02-21 LAB — LIPID PANEL
Cholesterol: 162 mg/dL (ref 0–200)
HDL: 70.9 mg/dL
LDL Cholesterol: 78 mg/dL (ref 0–99)
NonHDL: 91
Total CHOL/HDL Ratio: 2
Triglycerides: 66 mg/dL (ref 0.0–149.0)
VLDL: 13.2 mg/dL (ref 0.0–40.0)

## 2021-02-21 MED FILL — Gabapentin Cap 300 MG: ORAL | 30 days supply | Qty: 90 | Fill #0 | Status: AC

## 2021-02-21 NOTE — Patient Instructions (Addendum)
Health Maintenance, Female Adopting a healthy lifestyle and getting preventive care are important in promoting health and wellness. Ask your health care provider about:  The right schedule for you to have regular tests and exams.  Things you can do on your own to prevent diseases and keep yourself healthy. What should I know about diet, weight, and exercise? Eat a healthy diet  Eat a diet that includes plenty of vegetables, fruits, low-fat dairy products, and lean protein.  Do not eat a lot of foods that are high in solid fats, added sugars, or sodium.   Maintain a healthy weight Body mass index (BMI) is used to identify weight problems. It estimates body fat based on height and weight. Your health care provider can help determine your BMI and help you achieve or maintain a healthy weight. Get regular exercise Get regular exercise. This is one of the most important things you can do for your health. Most adults should:  Exercise for at least 150 minutes each week. The exercise should increase your heart rate and make you sweat (moderate-intensity exercise).  Do strengthening exercises at least twice a week. This is in addition to the moderate-intensity exercise.  Spend less time sitting. Even light physical activity can be beneficial. Watch cholesterol and blood lipids Have your blood tested for lipids and cholesterol at 58 years of age, then have this test every 5 years. Have your cholesterol levels checked more often if:  Your lipid or cholesterol levels are high.  You are older than 58 years of age.  You are at high risk for heart disease. What should I know about cancer screening? Depending on your health history and family history, you may need to have cancer screening at various ages. This may include screening for:  Breast cancer.  Cervical cancer.  Colorectal cancer.  Skin cancer.  Lung cancer. What should I know about heart disease, diabetes, and high blood  pressure? Blood pressure and heart disease  High blood pressure causes heart disease and increases the risk of stroke. This is more likely to develop in people who have high blood pressure readings, are of African descent, or are overweight.  Have your blood pressure checked: ? Every 3-5 years if you are 18-39 years of age. ? Every year if you are 40 years old or older. Diabetes Have regular diabetes screenings. This checks your fasting blood sugar level. Have the screening done:  Once every three years after age 40 if you are at a normal weight and have a low risk for diabetes.  More often and at a younger age if you are overweight or have a high risk for diabetes. What should I know about preventing infection? Hepatitis B If you have a higher risk for hepatitis B, you should be screened for this virus. Talk with your health care provider to find out if you are at risk for hepatitis B infection. Hepatitis C Testing is recommended for:  Everyone born from 1945 through 1965.  Anyone with known risk factors for hepatitis C. Sexually transmitted infections (STIs)  Get screened for STIs, including gonorrhea and chlamydia, if: ? You are sexually active and are younger than 58 years of age. ? You are older than 58 years of age and your health care provider tells you that you are at risk for this type of infection. ? Your sexual activity has changed since you were last screened, and you are at increased risk for chlamydia or gonorrhea. Ask your health care provider   if you are at risk.  Ask your health care provider about whether you are at high risk for HIV. Your health care provider may recommend a prescription medicine to help prevent HIV infection. If you choose to take medicine to prevent HIV, you should first get tested for HIV. You should then be tested every 3 months for as long as you are taking the medicine. Pregnancy  If you are about to stop having your period (premenopausal) and  you may become pregnant, seek counseling before you get pregnant.  Take 400 to 800 micrograms (mcg) of folic acid every day if you become pregnant.  Ask for birth control (contraception) if you want to prevent pregnancy. Osteoporosis and menopause Osteoporosis is a disease in which the bones lose minerals and strength with aging. This can result in bone fractures. If you are 65 years old or older, or if you are at risk for osteoporosis and fractures, ask your health care provider if you should:  Be screened for bone loss.  Take a calcium or vitamin D supplement to lower your risk of fractures.  Be given hormone replacement therapy (HRT) to treat symptoms of menopause. Follow these instructions at home: Lifestyle  Do not use any products that contain nicotine or tobacco, such as cigarettes, e-cigarettes, and chewing tobacco. If you need help quitting, ask your health care provider.  Do not use street drugs.  Do not share needles.  Ask your health care provider for help if you need support or information about quitting drugs. Alcohol use  Do not drink alcohol if: ? Your health care provider tells you not to drink. ? You are pregnant, may be pregnant, or are planning to become pregnant.  If you drink alcohol: ? Limit how much you use to 0-1 drink a day. ? Limit intake if you are breastfeeding.  Be aware of how much alcohol is in your drink. In the [place] one drink equals one 12 oz bottle of beer (355 mL), one 5 oz glass of wine (148 mL), or one 1 oz glass of hard liquor (44 mL). General instructions  Schedule regular health, dental, and eye exams.  Stay current with your vaccines.  Tell your health care provider if: ? You often feel depressed. ? You have ever been abused or do not feel safe at home. Summary  Adopting a healthy lifestyle and getting preventive care are important in promoting health and wellness.  Follow your health care provider's instructions about healthy  diet, exercising, and getting tested or screened for diseases.  Follow your health care provider's instructions on monitoring your cholesterol and blood pressure. This information is not intended to replace advice given to you by your health care provider. Make sure you discuss any questions you have with your health care provider. Document Revised: 10/21/2018 Document Reviewed: 10/21/2018 [employer]vier Patient Education  2021 [employer]  
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
## 2021-03-09 ENCOUNTER — Other Ambulatory Visit (HOSPITAL_BASED_OUTPATIENT_CLINIC_OR_DEPARTMENT_OTHER): Payer: Self-pay

## 2021-04-03 ENCOUNTER — Other Ambulatory Visit (HOSPITAL_BASED_OUTPATIENT_CLINIC_OR_DEPARTMENT_OTHER): Payer: Self-pay

## 2021-04-04 ENCOUNTER — Other Ambulatory Visit (HOSPITAL_BASED_OUTPATIENT_CLINIC_OR_DEPARTMENT_OTHER): Payer: Self-pay

## 2021-04-13 ENCOUNTER — Other Ambulatory Visit (HOSPITAL_BASED_OUTPATIENT_CLINIC_OR_DEPARTMENT_OTHER): Payer: Self-pay

## 2021-04-16 ENCOUNTER — Other Ambulatory Visit (HOSPITAL_BASED_OUTPATIENT_CLINIC_OR_DEPARTMENT_OTHER): Payer: Self-pay

## 2021-04-19 ENCOUNTER — Other Ambulatory Visit (HOSPITAL_BASED_OUTPATIENT_CLINIC_OR_DEPARTMENT_OTHER): Payer: Self-pay

## 2021-05-24 MED FILL — Gabapentin Cap 300 MG: ORAL | 30 days supply | Qty: 90 | Fill #1 | Status: CN

## 2021-05-25 ENCOUNTER — Other Ambulatory Visit (HOSPITAL_BASED_OUTPATIENT_CLINIC_OR_DEPARTMENT_OTHER): Payer: Self-pay

## 2021-05-25 ENCOUNTER — Other Ambulatory Visit (HOSPITAL_COMMUNITY): Payer: Self-pay

## 2021-05-25 MED FILL — Gabapentin Cap 300 MG: ORAL | 30 days supply | Qty: 90 | Fill #0 | Status: AC

## 2021-05-28 ENCOUNTER — Other Ambulatory Visit (HOSPITAL_COMMUNITY): Payer: Self-pay

## 2021-05-28 ENCOUNTER — Other Ambulatory Visit (HOSPITAL_BASED_OUTPATIENT_CLINIC_OR_DEPARTMENT_OTHER): Payer: Self-pay

## 2021-06-08 ENCOUNTER — Other Ambulatory Visit (HOSPITAL_BASED_OUTPATIENT_CLINIC_OR_DEPARTMENT_OTHER): Payer: Self-pay

## 2021-07-19 ENCOUNTER — Encounter: Payer: Self-pay | Admitting: Family Medicine

## 2021-07-19 NOTE — Telephone Encounter (Signed)
Please advise, if appropriate

## 2021-07-23 ENCOUNTER — Other Ambulatory Visit (HOSPITAL_BASED_OUTPATIENT_CLINIC_OR_DEPARTMENT_OTHER): Payer: Self-pay

## 2021-07-23 MED ORDER — SM LORATA-DINE D 10-240 MG PO TB24
1.0000 | ORAL_TABLET | Freq: Every day | ORAL | 2 refills | Status: DC
  Filled 2021-07-23: qty 30, 30d supply, fill #0

## 2021-07-25 ENCOUNTER — Other Ambulatory Visit (HOSPITAL_BASED_OUTPATIENT_CLINIC_OR_DEPARTMENT_OTHER): Payer: Self-pay

## 2021-08-24 ENCOUNTER — Ambulatory Visit
Admission: RE | Admit: 2021-08-24 | Discharge: 2021-08-24 | Disposition: A | Discharge status: Home / Self Care | Payer: No Typology Code available for payment source | Source: Ambulatory Visit | Attending: Family Medicine | Admitting: Family Medicine

## 2021-08-24 ENCOUNTER — Other Ambulatory Visit: Payer: Self-pay

## 2021-08-24 DIAGNOSIS — R928 Other abnormal and inconclusive findings on diagnostic imaging of breast: Secondary | ICD-10-CM

## 2021-08-27 ENCOUNTER — Telehealth: Payer: Self-pay

## 2021-08-27 NOTE — Telephone Encounter (Addendum)
Has been taking effexor for hot flashes and is not helping any longer, hot flashes are back.  She has an appt next week, should she talk to them about another medication option? Does she want to increase Effexor?  Does she have to ween herself off Effexor, Omeprazole.  [phone]

## 2021-08-28 ENCOUNTER — Other Ambulatory Visit: Payer: Self-pay | Admitting: Family Medicine

## 2021-08-28 ENCOUNTER — Other Ambulatory Visit (HOSPITAL_BASED_OUTPATIENT_CLINIC_OR_DEPARTMENT_OTHER): Payer: Self-pay

## 2021-08-28 MED ORDER — OMEPRAZOLE 40 MG PO CPDR
40.0000 mg | DELAYED_RELEASE_CAPSULE | Freq: Every day | ORAL | 0 refills | Status: DC
  Filled 2021-08-28: qty 30, 30d supply, fill #0

## 2021-08-28 MED ORDER — VENLAFAXINE HCL ER 150 MG PO CP24
150.0000 mg | ORAL_CAPSULE | Freq: Every day | ORAL | 0 refills | Status: DC
  Filled 2021-08-28: qty 30, 30d supply, fill #0

## 2021-08-28 MED ORDER — CARESTART COVID-19 HOME TEST VI KIT
PACK | 0 refills | Status: DC
  Filled 2021-08-28: qty 2, 4d supply, fill #0

## 2021-08-28 NOTE — Telephone Encounter (Signed)
Scheduled pt for [facility] at next avail 10/28. Pt asked if she can refill meds at 30 day instead of 90, informed pt to inform pharmacy to refill 30 if she feel and they can adjust if needed.   FYI

## 2021-09-05 NOTE — Progress Notes (Signed)
60 y.o. [id number] Legally Separated White or Caucasian Hispanic or Latino female here for annual exam.  No vaginal bleeding. No dyspareunia.   Patient states that she is still having hot flashes, tolerable with the Effexor.  She is also having intermittent vaginal odor. She uses boric acid suppositories that that seems to help. She feels like she is doing okay. She lives with her long partner.  Getting divorced this year, still friends with her ex.     No LMP recorded (lmp unknown). Patient is postmenopausal.          Sexually active: Yes.    The current method of family planning is post menopausal status.    Exercising: Yes.     Gym Cardio weights  Smoker:  no  Health Maintenance: Pap:   12/25/17 WNL HR HPV Neg 2016 WNL per patient  History of abnormal Pap:  yes yes had colpo- neg  MMG:  08/24/21 Bi-rads 2 benign  BMD:   never  Colonoscopy: 2015 polyp, she is due for another one.  She is due. TDaP:  01/14/17 Gardasil: n/a   reports that she has never smoked. She has never used smokeless tobacco. She reports current alcohol use of about 1.0 standard drink per week. She reports that she does not use drugs. She is a Engineer, building services for [employer]. Kids are 28-35, 5 grand children. 2 step children.   Past Medical History:  Diagnosis Date   Bilateral edema of lower extremity    tried on low dose lasix by prior provider   Chicken pox    Closed fracture of lower end of right radius with routine healing 08/18/2019   Dysphagia 07/22/2018   Hemorrhoid    HSV infection    Hypothyroidism    PONV (postoperative nausea and vomiting)    Rotator cuff impingement syndrome of right shoulder     Past Surgical History:  Procedure Laterality Date   APPENDECTOMY  1973   BREAST EXCISIONAL BIOPSY Right 2014   EXAM UNDER ANESTHESIA WITH MANIPULATION OF SHOULDER Right 01/17/2020   Procedure: Right shoulder manipulation under anesthesia;  Surgeon: Tania Ade, MD;  Location: [place];  Service: Orthopedics;  Laterality: Right;   HEMORRHOID SURGERY     OPEN REDUCTION INTERNAL FIXATION (ORIF) DISTAL RADIAL FRACTURE Right 08/12/2019   Procedure: OPEN REDUCTION INTERNAL FIXATION (ORIF) DISTAL RADIAL FRACTURE;  Surgeon: Leanora Cover, MD;  Location: [place];  Service: Orthopedics;  Laterality: Right;   SHOULDER INJECTION Right 01/17/2020   Procedure: SHOULDER STEROID INJECTION;  Surgeon: Tania Ade, MD;  Location: [place];  Service: Orthopedics;  Laterality: Right;   TUBAL LIGATION  2004    Current Outpatient Medications  Medication Sig Dispense Refill   atorvastatin (LIPITOR) 20 MG tablet Take 1 tablet (20 mg total) by mouth daily. 90 tablet 3   Cholecalciferol (VITAMIN D) 50 MCG (2000 UT) tablet Take 2,000 Units by mouth daily.     Diclofenac Sodium CR 100 MG 24 hr tablet TAKE 1 TABLET (100 MG TOTAL) BY MOUTH DAILY. 90 tablet 3   loratadine-pseudoephedrine (SM LORATA-DINE D) 10-240 MG 24 hr tablet Take 1 tablet by mouth daily. 30 tablet 2   omeprazole (PRILOSEC) 40 MG capsule Take 1 capsule (40 mg total) by mouth daily. 30 capsule 0   SYNTHROID 100 MCG tablet TAKE 1 TABLET (100 MCG TOTAL) BY MOUTH DAILY BEFORE BREAKFAST. 90 tablet 3   venlafaxine XR (EFFEXOR XR) 150 MG 24 hr capsule Take  1 capsule (150 mg total) by mouth daily with breakfast. 30 capsule 0   COVID-19 At Home Antigen Test (CARESTART COVID-19 HOME TEST) KIT Use as directed (Patient not taking: Reported on 09/06/2021) 2 kit 0   gabapentin (NEURONTIN) 300 MG capsule TAKE 1 CAPSULE (300 MG TOTAL) BY MOUTH 3 (THREE) TIMES DAILY AS NEEDED (NERVE PAIN). 90 capsule 3   No current facility-administered medications for this visit.    Family History  Problem Relation Age of Onset   Diabetes Mother    Valvular heart disease Father    Breast cancer Maternal Aunt        40    Review of Systems  All other systems reviewed and are negative.  Exam:   BP 130/76    Pulse 98   Ht 5' 4"  (1.626 m)   Wt 174 lb (78.9 kg)   LMP  (LMP Unknown)   SpO2 98%   BMI 29.87 kg/m   Weight change: @WEIGHTCHANGE @ Height:   Height: 5' 4"  (162.6 cm)  Ht Readings from Last 3 Encounters:  09/06/21 5' 4"  (1.626 m)  02/21/21 5' 4"  (1.626 m)  09/07/20 5' 4"  (1.626 m)    General appearance: alert, cooperative and appears stated age Head: Normocephalic, without obvious abnormality, atraumatic Neck: no adenopathy, supple, symmetrical, trachea midline and thyroid normal to inspection and palpation Lungs: clear to auscultation bilaterally Cardiovascular: regular rate and rhythm Breasts: normal appearance, no masses or tenderness Abdomen: soft, non-tender; non distended,  no masses,  no organomegaly Extremities: extremities normal, atraumatic, no cyanosis or edema Skin: Skin color, texture, turgor normal. No rashes or lesions Lymph nodes: Cervical, supraclavicular, and axillary nodes normal. No abnormal inguinal nodes palpated Neurologic: Grossly normal   Pelvic: External genitalia:  no lesions              Urethra:  normal appearing urethra with no masses, tenderness or lesions              Bartholins and Skenes: normal                 Vagina: normal appearing vagina with normal color and discharge, no lesions              Cervix:  pedunculated nabothian cyst at external os               Bimanual Exam:  Uterus:  normal size, contour, position, consistency, mobility, non-tender              Adnexa: no mass, fullness, tenderness               Rectovaginal: Confirms               Anus:  normal sphincter tone, no lesions  Gae Dry chaperoned for the exam.  1. Well woman exam No pap this year Mammogram UTD Discussed breast self exam Discussed calcium and vit D intake Labs with primary Primary is managing her effexor for her vasomotor symptoms  2. Colon cancer screening - Ambulatory referral to Gastroenterology

## 2021-09-06 ENCOUNTER — Other Ambulatory Visit: Payer: Self-pay

## 2021-09-06 ENCOUNTER — Other Ambulatory Visit (HOSPITAL_BASED_OUTPATIENT_CLINIC_OR_DEPARTMENT_OTHER): Payer: Self-pay

## 2021-09-06 ENCOUNTER — Encounter: Payer: Self-pay | Admitting: Obstetrics and Gynecology

## 2021-09-06 ENCOUNTER — Ambulatory Visit (INDEPENDENT_AMBULATORY_CARE_PROVIDER_SITE_OTHER): Payer: No Typology Code available for payment source | Admitting: Obstetrics and Gynecology

## 2021-09-06 VITALS — BP 130/76 | HR 98 | Ht 64.0 in | Wt 174.0 lb

## 2021-09-06 DIAGNOSIS — Z1211 Encounter for screening for malignant neoplasm of colon: Secondary | ICD-10-CM | POA: Diagnosis not present

## 2021-09-06 DIAGNOSIS — Z01419 Encounter for gynecological examination (general) (routine) without abnormal findings: Secondary | ICD-10-CM | POA: Diagnosis not present

## 2021-09-06 NOTE — Patient Instructions (Signed)

## 2021-09-07 ENCOUNTER — Ambulatory Visit: Payer: No Typology Code available for payment source | Admitting: Family Medicine

## 2021-09-07 DIAGNOSIS — M545 Low back pain, unspecified: Secondary | ICD-10-CM

## 2021-09-17 ENCOUNTER — Ambulatory Visit (INDEPENDENT_AMBULATORY_CARE_PROVIDER_SITE_OTHER): Payer: No Typology Code available for payment source | Admitting: Family Medicine

## 2021-09-17 ENCOUNTER — Encounter (HOSPITAL_BASED_OUTPATIENT_CLINIC_OR_DEPARTMENT_OTHER): Payer: Self-pay | Admitting: Pharmacist

## 2021-09-17 ENCOUNTER — Other Ambulatory Visit (HOSPITAL_BASED_OUTPATIENT_CLINIC_OR_DEPARTMENT_OTHER): Payer: Self-pay

## 2021-09-17 ENCOUNTER — Other Ambulatory Visit: Payer: Self-pay

## 2021-09-17 ENCOUNTER — Encounter: Payer: Self-pay | Admitting: Family Medicine

## 2021-09-17 VITALS — BP 117/75 | HR 79 | Temp 98.1°F | Ht 64.0 in | Wt 176.0 lb

## 2021-09-17 DIAGNOSIS — M545 Low back pain, unspecified: Secondary | ICD-10-CM

## 2021-09-17 DIAGNOSIS — E782 Mixed hyperlipidemia: Secondary | ICD-10-CM

## 2021-09-17 DIAGNOSIS — Z5320 Procedure and treatment not carried out because of patient's decision for unspecified reasons: Secondary | ICD-10-CM | POA: Diagnosis not present

## 2021-09-17 DIAGNOSIS — E039 Hypothyroidism, unspecified: Secondary | ICD-10-CM | POA: Diagnosis not present

## 2021-09-17 DIAGNOSIS — R232 Flushing: Secondary | ICD-10-CM | POA: Diagnosis not present

## 2021-09-17 DIAGNOSIS — M1990 Unspecified osteoarthritis, unspecified site: Secondary | ICD-10-CM | POA: Insufficient documentation

## 2021-09-17 DIAGNOSIS — M79674 Pain in right toe(s): Secondary | ICD-10-CM | POA: Insufficient documentation

## 2021-09-17 MED ORDER — SYNTHROID 100 MCG PO TABS
ORAL_TABLET | ORAL | 1 refills | Status: DC
  Filled 2021-09-17: qty 90, 90d supply, fill #0
  Filled 2021-09-18: qty 90, fill #0
  Filled 2021-10-17: qty 90, 90d supply, fill #0
  Filled 2022-01-25: qty 90, 90d supply, fill #1

## 2021-09-17 MED ORDER — SM LORATA-DINE D 10-240 MG PO TB24
1.0000 | ORAL_TABLET | Freq: Every day | ORAL | 2 refills | Status: DC
  Filled 2021-09-17: qty 30, 30d supply, fill #0

## 2021-09-17 MED ORDER — VENLAFAXINE HCL ER 150 MG PO CP24
150.0000 mg | ORAL_CAPSULE | Freq: Every day | ORAL | 1 refills | Status: DC
  Filled 2021-09-17 – 2021-09-21 (×2): qty 90, 90d supply, fill #0
  Filled 2021-12-21: qty 90, 90d supply, fill #1

## 2021-09-17 MED ORDER — DICLOFENAC SODIUM ER 100 MG PO TB24
ORAL_TABLET | Freq: Every day | ORAL | 3 refills | Status: DC
  Filled 2021-09-17: qty 90, 90d supply, fill #0
  Filled 2021-12-11: qty 90, 90d supply, fill #1

## 2021-09-17 MED ORDER — OMEPRAZOLE 40 MG PO CPDR
40.0000 mg | DELAYED_RELEASE_CAPSULE | Freq: Every day | ORAL | 3 refills | Status: DC
  Filled 2021-09-17 – 2021-09-21 (×2): qty 90, 90d supply, fill #0
  Filled 2021-12-21: qty 90, 90d supply, fill #1

## 2021-09-17 NOTE — Progress Notes (Signed)
This visit occurred during the SARS-CoV-2 public health emergency.  Safety protocols were in place, including screening questions prior to the visit, additional usage of staff PPE, and extensive cleaning of exam room while observing appropriate contact time as indicated for disinfecting solutions.    Patient ID: Molly Park, female  DOB: 05-21-1961, 60 y.o.   MRN: [id number] Patient Care Team    Relationship Specialty Notifications Start End  Ma Hillock, DO PCP - General Family Medicine  01/14/17   Salvadore Dom, MD Consulting Physician Obstetrics and Gynecology  01/14/18   Gatha Mayer, MD Consulting Physician Gastroenterology  02/21/21     Chief Complaint  Patient presents with   Hot Flashes    [facility]; pt is fasting    Subjective: Molly Park is a 60 y.o.  Female  present for [facility] All past medical history, surgical history, allergies, family history, immunizations, medications and social history were updated in the electronic medical record today. All recent labs, ED visits and hospitalizations within the last year were reviewed.  Hypothyroidism, unspecified type Thyroid levels have been stable.  Labs are up-to-date, due next visit.  Patient reports compliance with levothyroxine 100 mcg daily.  Hot flashes/focus Patient reports she has seen improvement in her focus and her hot flashes are stable.  She reports compliance with Effexor 150 mg daily.  Statin declined/ Mixed hyperlipidemia She has declined start of atorvastatin.  She is still considering.  Lumbar pain/arthritis She reports she has continue diclofenac once daily with food.  She denies any reflux symptoms.  She ran out of medication and noticed that her right knee was aching as well as some of the areas in her feet.  GERD: Patient reports symptoms are well controlled on omeprazole 40 mg daily.  Symptoms do return if she discontinues omeprazole.  Symptoms present prior to start of NSAIDs and have not  worsened since.  Right toe pain: Patient reports she has noticed pain in her right large toe.  She feels the pain is not as bad when she wears tennis shoes.  However any high heels or shoes without great support her toe becomes painful.  She states that it has a bony area that is tender over her right large toe.  She has been putting diclofenac gel over the area and it seems to be helpful.  Depression screen [facility] 2/9 09/17/2021 02/21/2021 02/13/2021 01/26/2020 01/20/2019  Decreased Interest 0 0 1 0 0  Down, Depressed, Hopeless 0 0 0 0 0  PHQ - 2 Score 0 0 1 0 0  Altered sleeping - - - 1 0  Tired, decreased energy - - - 0 0  Change in appetite - - - 0 0  Feeling bad or failure about yourself  - - - 0 0  Trouble concentrating - - - 0 0  Moving slowly or fidgety/restless - - - 0 0  Suicidal thoughts - - - 0 0  PHQ-9 Score - - - 1 0  Difficult doing work/chores - - - Not difficult at all Not difficult at all   GAD 7 : Generalized Anxiety Score 01/20/2019 10/27/2018  Nervous, Anxious, on Edge 0 1  Control/stop worrying 0 1  Worry too much - different things 0 1  Trouble relaxing 0 1  Restless 0 1  Easily annoyed or irritable 0 0  Afraid - awful might happen 0 0  Total GAD 7 Score 0 5  Anxiety Difficulty Not difficult at all Not difficult at all  Immunization History  Administered Date(s) Administered   Hepatitis A, Ped/Adol-2 Dose 12/07/2012   Influenza, Seasonal, Injecte, Preservative Fre 09/15/2014   Influenza-Unspecified 08/11/2016, 09/15/2018, 08/11/2020, 08/11/2021   PFIZER(Purple Top)SARS-COV-2 Vaccination 11/16/2019, 12/06/2019   Tdap 01/14/2017   Zoster Recombinat (Shingrix) 01/20/2019    Past Medical History:  Diagnosis Date   Bilateral edema of lower extremity    tried on low dose lasix by prior provider   Chicken pox    Closed fracture of lower end of right radius with routine healing 08/18/2019   Dysphagia 07/22/2018   Hemorrhoid    HSV infection    Hypothyroidism     PONV (postoperative nausea and vomiting)    Rotator cuff impingement syndrome of right shoulder    No Known Allergies Past Surgical History:  Procedure Laterality Date   APPENDECTOMY  1973   BREAST EXCISIONAL BIOPSY Right 2014   EXAM UNDER ANESTHESIA WITH MANIPULATION OF SHOULDER Right 01/17/2020   Procedure: Right shoulder manipulation under anesthesia;  Surgeon: Tania Ade, MD;  Location: [place];  Service: Orthopedics;  Laterality: Right;   HEMORRHOID SURGERY     OPEN REDUCTION INTERNAL FIXATION (ORIF) DISTAL RADIAL FRACTURE Right 08/12/2019   Procedure: OPEN REDUCTION INTERNAL FIXATION (ORIF) DISTAL RADIAL FRACTURE;  Surgeon: Leanora Cover, MD;  Location: [place];  Service: Orthopedics;  Laterality: Right;   SHOULDER INJECTION Right 01/17/2020   Procedure: SHOULDER STEROID INJECTION;  Surgeon: Tania Ade, MD;  Location: [place];  Service: Orthopedics;  Laterality: Right;   TUBAL LIGATION  2004   Family History  Problem Relation Age of Onset   Diabetes Mother    Valvular heart disease Father    Breast cancer Maternal Aunt        17   Social History   Social History Narrative   Patient is married to Molly Park. They have 5 children btw them.   BA degree. 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   Drinks caffeine.   Wears a seatbelt, wears a bicycle helmet, smoke detector in the home.   Firearms in the home.   Exercises routinely.   Feels safe in her relationships.    Allergies as of 09/17/2021   No Known Allergies      Medication List        Accurate as of September 17, 2021 12:17 PM. If you have any questions, ask your nurse or doctor.          STOP taking these medications    Carestart COVID-19 Home Test Kit Generic drug: COVID-19 At Home Antigen Test Stopped by: Howard Pouch, DO       TAKE these medications    atorvastatin 20 MG tablet Commonly known as:  LIPITOR Take 1 tablet (20 mg total) by mouth daily.   Diclofenac Sodium CR 100 MG 24 hr tablet TAKE 1 TABLET (100 MG TOTAL) BY MOUTH DAILY.   gabapentin 300 MG capsule Commonly known as: NEURONTIN TAKE 1 CAPSULE (300 MG TOTAL) BY MOUTH 3 (THREE) TIMES DAILY AS NEEDED (NERVE PAIN).   omeprazole 40 MG capsule Commonly known as: PRILOSEC Take 1 capsule (40 mg total) by mouth daily.   SM Lorata-dine D 10-240 MG 24 hr tablet Generic drug: loratadine-pseudoephedrine Take 1 tablet by mouth daily.   Synthroid 100 MCG tablet Generic drug: levothyroxine TAKE 1 TABLET (100 MCG TOTAL) BY MOUTH DAILY BEFORE BREAKFAST.   venlafaxine XR 150 MG 24 hr capsule Commonly known as: Effexor XR  Take 1 capsule (150 mg total) by mouth daily with breakfast.   Vitamin D 50 MCG (2000 UT) tablet Take 2,000 Units by mouth daily.        All past medical history, surgical history, allergies, family history, immunizations andmedications were updated in the EMR today and reviewed under the history and medication portions of their EMR.      ROS: 14 pt review of systems performed and negative (unless mentioned in an HPI)  Objective: BP 117/75   Pulse 79   Temp 98.1 F (36.7 C) (Oral)   Ht 5' 4" (1.626 m)   Wt 176 lb (79.8 kg)   LMP  (LMP Unknown)   SpO2 97%   BMI 30.21 kg/m  Gen: Afebrile. No acute distress.  Nontoxic, very pleasant female. HENT: AT. [place].  No cough or hoarseness Eyes:Pupils Equal Round Reactive to light, Extraocular movements intact,  Conjunctiva without redness, discharge or icterus. MSK/Right large toe: No erythema, no soft tissue swelling.  Bony prominence/over right large toe proximal joint.  Discomfort with flexion and extension of toe.  Neurovascular intact distally. Skin: No rashes, purpura or petechiae.  Neuro:  Normal gait. PERLA. EOMi. Alert. Oriented x3 Psych: Normal affect, dress and demeanor. Normal speech. Normal thought content and judgment.   No results  found.  Assessment/plan: Samariyah Cowles is a 60 y.o. female present for CPE  Hypothyroidism, unspecified type Stable Continue levo 100 mcg Labs utd 02/2021  Hot flashes/focus: Stable. Continue effexor to  150 mg  Gerd: Stable. Continue omperazole qd  Hyperlip: Stable. Continue statin recommended.   Lumbar pain/arthritis: Stable. Continue diclofenac qd with food.  Gabapentin prescribed by SM.   Right large toe pain:  Referral to podiatry placed today.  Likely arthritis causing symptoms.  Can continue otc diclofenac gel for comfort.   Return in about 23 weeks (around 02/25/2022) for CPE (30 min), [facility] (30 min).   No orders of the defined types were placed in this encounter.  Meds ordered this encounter  Medications   omeprazole (PRILOSEC) 40 MG capsule    Sig: Take 1 capsule (40 mg total) by mouth daily.    Dispense:  90 capsule    Refill:  3   venlafaxine XR (EFFEXOR XR) 150 MG 24 hr capsule    Sig: Take 1 capsule (150 mg total) by mouth daily with breakfast.    Dispense:  90 capsule    Refill:  1   SYNTHROID 100 MCG tablet    Sig: TAKE 1 TABLET (100 MCG TOTAL) BY MOUTH DAILY BEFORE BREAKFAST.    Dispense:  90 tablet    Refill:  1   Diclofenac Sodium CR 100 MG 24 hr tablet    Sig: TAKE 1 TABLET (100 MG TOTAL) BY MOUTH DAILY.    Dispense:  90 tablet    Refill:  3   loratadine-pseudoephedrine (SM LORATA-DINE D) 10-240 MG 24 hr tablet    Sig: Take 1 tablet by mouth daily.    Dispense:  30 tablet    Refill:  2    Referral Orders  No referral(s) requested today     Electronically signed by: Howard Pouch, [place]

## 2021-09-17 NOTE — Patient Instructions (Signed)
  Great to see you today.  I have refilled the medication(s) we provide.   If labs were collected, we will inform you of lab results once received either by echart message or telephone call.   - echart message- for normal results that have been seen by the patient already.   - telephone call: abnormal results or if patient has not viewed results in their echart.   I placed referral to podiatry for you today.

## 2021-09-18 ENCOUNTER — Other Ambulatory Visit (HOSPITAL_BASED_OUTPATIENT_CLINIC_OR_DEPARTMENT_OTHER): Payer: Self-pay

## 2021-09-18 MED ORDER — COVID-19 AT HOME ANTIGEN TEST VI KIT
PACK | 0 refills | Status: DC
  Filled 2021-09-18: qty 2, 4d supply, fill #0

## 2021-09-21 ENCOUNTER — Other Ambulatory Visit (HOSPITAL_BASED_OUTPATIENT_CLINIC_OR_DEPARTMENT_OTHER): Payer: Self-pay

## 2021-09-26 ENCOUNTER — Other Ambulatory Visit: Payer: Self-pay | Admitting: Family Medicine

## 2021-09-27 ENCOUNTER — Other Ambulatory Visit (HOSPITAL_BASED_OUTPATIENT_CLINIC_OR_DEPARTMENT_OTHER): Payer: Self-pay

## 2021-09-28 ENCOUNTER — Other Ambulatory Visit (HOSPITAL_BASED_OUTPATIENT_CLINIC_OR_DEPARTMENT_OTHER): Payer: Self-pay

## 2021-09-28 MED ORDER — GABAPENTIN 300 MG PO CAPS
ORAL_CAPSULE | ORAL | 3 refills | Status: DC
  Filled 2021-09-28: qty 90, 30d supply, fill #0
  Filled 2021-10-17: qty 90, 90d supply, fill #0
  Filled 2021-10-22: qty 90, 30d supply, fill #0
  Filled 2022-07-28: qty 90, 30d supply, fill #1

## 2021-10-10 ENCOUNTER — Other Ambulatory Visit (HOSPITAL_BASED_OUTPATIENT_CLINIC_OR_DEPARTMENT_OTHER): Payer: Self-pay

## 2021-10-17 ENCOUNTER — Other Ambulatory Visit (HOSPITAL_BASED_OUTPATIENT_CLINIC_OR_DEPARTMENT_OTHER): Payer: Self-pay

## 2021-10-22 ENCOUNTER — Ambulatory Visit (INDEPENDENT_AMBULATORY_CARE_PROVIDER_SITE_OTHER): Payer: No Typology Code available for payment source | Admitting: Family Medicine

## 2021-10-22 ENCOUNTER — Other Ambulatory Visit: Payer: Self-pay

## 2021-10-22 ENCOUNTER — Encounter: Payer: Self-pay | Admitting: Family Medicine

## 2021-10-22 ENCOUNTER — Other Ambulatory Visit (HOSPITAL_BASED_OUTPATIENT_CLINIC_OR_DEPARTMENT_OTHER): Payer: Self-pay

## 2021-10-22 VITALS — BP 117/64 | HR 72 | Temp 98.1°F | Ht 64.0 in | Wt 177.0 lb

## 2021-10-22 DIAGNOSIS — S336XXA Sprain of sacroiliac joint, initial encounter: Secondary | ICD-10-CM

## 2021-10-22 DIAGNOSIS — M545 Low back pain, unspecified: Secondary | ICD-10-CM

## 2021-10-22 HISTORY — DX: Sprain of sacroiliac joint, initial encounter: S33.6XXA

## 2021-10-22 MED ORDER — METAXALONE 400 MG PO TABS
800.0000 mg | ORAL_TABLET | Freq: Two times a day (BID) | ORAL | 2 refills | Status: DC | PRN
  Filled 2021-10-22 (×2): qty 60, 15d supply, fill #0

## 2021-10-22 MED ORDER — METHYLPREDNISOLONE ACETATE 80 MG/ML IJ SUSP
80.0000 mg | Freq: Once | INTRAMUSCULAR | Status: AC
  Administered 2021-10-22: 80 mg via INTRAMUSCULAR

## 2021-10-22 MED ORDER — PREDNISONE 20 MG PO TABS
ORAL_TABLET | ORAL | 0 refills | Status: DC
  Filled 2021-10-22: qty 18, 10d supply, fill #0

## 2021-10-22 NOTE — Progress Notes (Signed)
This visit occurred during the SARS-CoV-2 public health emergency.  Safety protocols were in place, including screening questions prior to the visit, additional usage of staff PPE, and extensive cleaning of exam room while observing appropriate contact time as indicated for disinfecting solutions.    Patient ID: Molly Park, female  DOB: 04/18/1961, 60 y.o.   MRN: [id number] Patient Care Team    Relationship Specialty Notifications Start End  Ma Hillock, DO PCP - General Family Medicine  01/14/17   Salvadore Dom, MD Consulting Physician Obstetrics and Gynecology  01/14/18   Gatha Mayer, MD Consulting Physician Gastroenterology  02/21/21     Chief Complaint  Patient presents with   Back Pain    Pt c/o L LBP x 3 days after waking up one morning; No known injury; Pt reports pain as stiff, throbbing and shoot pain that travels to buttocks; Pt has taken ibuprofen and Diclofenac with gabapentin    Subjective: Molly Park is a 60 y.o.  Female  present for acute OV Lumbar pain/arthritis Patient reports she woke up 3 days ago and was in pain.  She points to the left lower back as the area of concern.  She states the weekend prior she had been moving things down from the attic for Christmas, but nothing the day prior to onset of symptoms.  She has been using heat, ice, massage, PT stretches without relief of symptoms.  She states that morning she got up and took a hot shower and it helped a little bit.  On Friday she awoke and could not get out of bed.  She states she could not twist/turn her trunk without excruciating pain.  She reports discomfort with picking up her foot to apply to the brake.  She has been using ibuprofen on top of her diclofenac scheduled medication to help control the pain.  She denies any bladder or bowel discomfort/dysfunction. She reports she has continue diclofenac once daily with food.  She denies any reflux symptoms.  She ran out of medication and noticed  that her right knee was aching as well as some of the areas in her feet.  MRI lumbar spine 2019: . Moderate facet arthrosis at L4-L5 and L5-S1 without stenosis. This may serve as a source of local back pain. Depression screen [facility] 2/9 09/17/2021 02/21/2021 02/13/2021 01/26/2020 01/20/2019  Decreased Interest 0 0 1 0 0  Down, Depressed, Hopeless 0 0 0 0 0  PHQ - 2 Score 0 0 1 0 0  Altered sleeping - - - 1 0  Tired, decreased energy - - - 0 0  Change in appetite - - - 0 0  Feeling bad or failure about yourself  - - - 0 0  Trouble concentrating - - - 0 0  Moving slowly or fidgety/restless - - - 0 0  Suicidal thoughts - - - 0 0  PHQ-9 Score - - - 1 0  Difficult doing work/chores - - - Not difficult at all Not difficult at all   GAD 7 : Generalized Anxiety Score 01/20/2019 10/27/2018  Nervous, Anxious, on Edge 0 1  Control/stop worrying 0 1  Worry too much - different things 0 1  Trouble relaxing 0 1  Restless 0 1  Easily annoyed or irritable 0 0  Afraid - awful might happen 0 0  Total GAD 7 Score 0 5  Anxiety Difficulty Not difficult at all Not difficult at all     Immunization History  Administered Date(s)  Administered   Hepatitis A, Ped/Adol-2 Dose 12/07/2012   Influenza, Seasonal, Injecte, Preservative Fre 09/15/2014   Influenza-Unspecified 08/11/2016, 09/15/2018, 08/11/2020, 08/11/2021   PFIZER(Purple Top)SARS-COV-2 Vaccination 11/16/2019, 12/06/2019   Tdap 01/14/2017   Zoster Recombinat (Shingrix) 01/20/2019    Past Medical History:  Diagnosis Date   Bilateral edema of lower extremity    tried on low dose lasix by prior provider   Chicken pox    Closed fracture of lower end of right radius with routine healing 08/18/2019   Dysphagia 07/22/2018   Hemorrhoid    HSV infection    Hypothyroidism    PONV (postoperative nausea and vomiting)    Rotator cuff impingement syndrome of right shoulder    No Known Allergies Past Surgical History:  Procedure Laterality Date    APPENDECTOMY  1973   BREAST EXCISIONAL BIOPSY Right 2014   EXAM UNDER ANESTHESIA WITH MANIPULATION OF SHOULDER Right 01/17/2020   Procedure: Right shoulder manipulation under anesthesia;  Surgeon: Tania Ade, MD;  Location: [place];  Service: Orthopedics;  Laterality: Right;   HEMORRHOID SURGERY     OPEN REDUCTION INTERNAL FIXATION (ORIF) DISTAL RADIAL FRACTURE Right 08/12/2019   Procedure: OPEN REDUCTION INTERNAL FIXATION (ORIF) DISTAL RADIAL FRACTURE;  Surgeon: Leanora Cover, MD;  Location: [place];  Service: Orthopedics;  Laterality: Right;   SHOULDER INJECTION Right 01/17/2020   Procedure: SHOULDER STEROID INJECTION;  Surgeon: Tania Ade, MD;  Location: [place];  Service: Orthopedics;  Laterality: Right;   TUBAL LIGATION  2004   Family History  Problem Relation Age of Onset   Diabetes Mother    Valvular heart disease Father    Breast cancer Maternal Aunt        59   Social History   Social History Narrative   Patient is married to [place]. They have 5 children btw them.   BA degree. 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   Drinks caffeine.   Wears a seatbelt, wears a bicycle helmet, smoke detector in the home.   Firearms in the home.   Exercises routinely.   Feels safe in her relationships.    Allergies as of 10/22/2021   No Known Allergies      Medication List        Accurate as of October 22, 2021 12:24 PM. If you have any questions, ask your nurse or doctor.          STOP taking these medications    Carestart COVID-19 Home Test Kit Generic drug: COVID-19 At Home Antigen Test Stopped by: Howard Pouch, DO       TAKE these medications    atorvastatin 20 MG tablet Commonly known as: LIPITOR Take 1 tablet (20 mg total) by mouth daily.   Diclofenac Sodium CR 100 MG 24 hr tablet TAKE 1 TABLET (100 MG TOTAL) BY MOUTH DAILY.   gabapentin 300 MG capsule Commonly  known as: NEURONTIN TAKE 1 CAPSULE (300 MG TOTAL) BY MOUTH THREE TIMES DAILY AS NEEDED FOR NERVE PAIN.   metaxalone 400 MG tablet Commonly known as: SKELAXIN Take 2 tablets (800 mg total) by mouth 2 (two) times daily as needed. Started by: Howard Pouch, DO   omeprazole 40 MG capsule Commonly known as: PRILOSEC Take 1 capsule (40 mg total) by mouth daily.   predniSONE 20 MG tablet Commonly known as: DELTASONE Take 3 tablets by mouth daily for 3 days, take 2 tablets daily for 3 days, take 1 tablet  daily for 2 days,then 1/2 tablet daily for 2 days (60 mg x3d, 40 mg x3d, 20 mg x2d, 10 mg x2d) Started by: Howard Pouch, DO   SM Lorata-dine D 10-240 MG 24 hr tablet Generic drug: loratadine-pseudoephedrine Take 1 tablet by mouth daily.   Synthroid 100 MCG tablet Generic drug: levothyroxine TAKE 1 TABLET (100 MCG TOTAL) BY MOUTH DAILY BEFORE BREAKFAST.   venlafaxine XR 150 MG 24 hr capsule Commonly known as: Effexor XR Take 1 capsule (150 mg total) by mouth daily with breakfast.   Vitamin D 50 MCG (2000 UT) tablet Take 2,000 Units by mouth daily.        All past medical history, surgical history, allergies, family history, immunizations andmedications were updated in the EMR today and reviewed under the history and medication portions of their EMR.      ROS: 14 pt review of systems performed and negative (unless mentioned in an HPI)  Objective: BP 117/64   Pulse 72   Temp 98.1 F (36.7 C) (Oral)   Ht 5' 4"  (1.626 m)   Wt 177 lb (80.3 kg)   LMP  (LMP Unknown)   SpO2 97%   BMI 30.38 kg/m  Gen: Afebrile. No acute distress.  Patient is moving very guardedly and appears uncomfortable. HENT: AT. [place].  No cough.  No hoarseness. Eyes:Pupils Equal Round Reactive to light, Extraocular movements intact,  Conjunctiva without redness, discharge or icterus. MSK: Bar spine without erythema.  No step-off.  No bony tenderness.  Left SI joint TTP.  Ropiness left paraspinal muscle group  with discomfort to palpation and movement.  Muscle strength 5/5 bilateral lower extremities with pain on resistance of hip flexion, extension and plantar flexion left.  Elected not to perform other special test due to level of discomfort patient today.  Neurovascularly intact distally. Skin: No rashes, purpura or petechiae.  Neuro: Guarded gait.  PERLA. EOMi. Alert. Oriented x3 Psych: Normal affect, dress and demeanor. Normal speech. Normal thought content and judgment.   No results found.  Assessment/plan: Molly Park is a 60 y.o. female present for Lumbar pain/SI strain Patient's exam is consistent with SI strain and lumbar paraspinal strain.  No alarm features on exam today. Continue diclofenac qd with food.  Patient told to discontinue ibuprofen use with diclofenac. Continue gabapentin prescribed by SM.  Prednisone taper starting tomorrow IM Depo-Medrol provided today Skelaxin twice daily with caution on any potential sedation associated with medication. Heat, rest and massage encouraged. Patient declined prescription for pain medication, offered tramadol. Follow-up in 2 weeks if symptoms are consistently improving, sooner if worsening.  Return in about 2 weeks (around 11/05/2021), or if symptoms worsen or fail to improve.   No orders of the defined types were placed in this encounter.  Meds ordered this encounter  Medications   predniSONE (DELTASONE) 20 MG tablet    Sig: 60 mg x3d, 40 mg x3d, 20 mg x2d, 10 mg x2d    Dispense:  18 tablet    Refill:  0   metaxalone (SKELAXIN) 400 MG tablet    Sig: Take 2 tablets (800 mg total) by mouth 2 (two) times daily as needed.    Dispense:  60 tablet    Refill:  2   methylPREDNISolone acetate (DEPO-MEDROL) injection 80 mg     Referral Orders  No referral(s) requested today     Electronically signed by: Howard Pouch, [facility] [place]

## 2021-10-22 NOTE — Patient Instructions (Addendum)
Great to see you today.  I have refilled the medication(s) we provide.   If labs were collected, we will inform you of lab results once received either by echart message or telephone call.   - echart message- for normal results that have been seen by the patient already.   - telephone call: abnormal results or if patient has not viewed results in their echart.   Stop ibuprofen use with diclofenac.   Steroid shot provided today.  Prednisone taper to start tomorrow.  Skelaxin every 12 hours for 1 week (or at least before bed)  Heat can help.    Sacroiliac Joint Dysfunction Sacroiliac joint dysfunction is a condition that causes inflammation on one or both sides of the sacroiliac (SI) joint. The SI joint is the joint between two bones of the pelvis called the sacrum and the ilium. The sacrum is the bone at the base of the spine. The ilium is the large bone that forms the hip. This condition causes deep aching or burning pain in the low back. In some cases, the pain may also spread into one or both buttocks, hips, or thighs. What are the causes? This condition may be caused by: Pregnancy. During pregnancy, extra stress is put on the SI joints because the pelvis widens. Injury, such as: Injuries from car crashes. Sports-related injuries. Work-related injuries. Having one leg that is shorter than the other. Conditions that affect the joints, such as: Rheumatoid arthritis. Gout. Psoriatic arthritis. Joint infection (septic arthritis). Sometimes, the cause of SI joint dysfunction is not known. What are the signs or symptoms? Symptoms of this condition include: Aching or burning pain in the lower back. The pain may also spread to other areas, such as: Buttocks. Groin. Thighs. Muscle spasms in or around the painful areas. Increased pain when standing, walking, running, stair climbing, bending, or lifting. How is this diagnosed? This condition is diagnosed with a physical exam and  your medical history. During the exam, the health care provider may move one or both of your legs to different positions to check for pain. Various tests may be done to confirm the diagnosis, including: Imaging tests to look for other causes of pain. These may include: MRI. CT scan. Bone scan. Diagnostic injection. A numbing medicine is injected into the SI joint using a needle. If your pain is temporarily improved or stopped after the injection, this can indicate that SI joint dysfunction is the problem. How is this treated? Treatment depends on the cause and severity of your condition. Treatment options can be noninvasive and may include: Ice or heat applied to the lower back area after an injury. This may help reduce pain and muscle spasms. Medicines to relieve pain or inflammation or to relax the muscles. Wearing a back brace (sacroiliac brace) to help support the joint while your back is healing. Physical therapy to increase muscle strength around the joint and flexibility at the joint. This may also involve learning proper body positions and ways of moving to relieve stress on the joint. Direct manipulation of the SI joint. Use of a device that provides electrical stimulation to help reduce pain at the joint. Other treatments may include: Injections of steroid medicine into the joint to reduce pain and swelling. Radiofrequency ablation. This treatment uses heat to burn away nerves that are carrying pain messages from the joint. Surgery to put in screws and plates that limit or prevent joint motion. This is rare. Follow these instructions at home: Medicines Take over-the-counter and  prescription medicines only as told by your health care provider. Ask your health care provider if the medicine prescribed to you: Requires you to avoid driving or using machinery. Can cause constipation. You may need to take these actions to prevent or treat constipation: Drink enough fluid to keep your  urine pale yellow. Take over-the-counter or prescription medicines. Eat foods that are high in fiber, such as beans, whole grains, and fresh fruits and vegetables. Limit foods that are high in fat and processed sugars, such as fried or sweet foods. If you have a brace: Wear the brace as told by your health care provider. Remove it only as told by your health care provider. Keep the brace clean. If the brace is not waterproof: Do not let it get wet. Cover it with a watertight covering when you take a bath or a shower. Managing pain, stiffness, and swelling   Icing can help with pain and swelling. Heat may help with muscle tension or spasms. Ask your health care provider if you should use ice or heat. If directed, put ice on the affected area: If you have a removable brace, remove it as told by your health care provider. Put ice in a plastic bag. Place a towel between your skin and the bag. Leave the ice on for 20 minutes, 2-3 times a day. Remove the ice if your skin turns bright red. This is very important. If you cannot feel pain, heat, or cold, you have a greater risk of damage to the area. If directed, apply heat to the affected area as often as told by your health care provider. Use the heat source that your health care provider recommends, such as a moist heat pack or a heating pad. Place a towel between your skin and the heat source. Leave the heat on for 20-30 minutes. Remove the heat if your skin turns bright red. This is especially important if you are unable to feel pain, heat, or cold. You may have a greater risk of getting burned. General instructions Rest as needed. Return to your normal activities as told by your health care provider. Ask your health care provider what activities are safe for you. Do exercises as told by your health care provider or physical therapist. Keep all follow-up visits. This is important. Contact a health care provider if: Your pain is not controlled  with medicine. You have a fever. Your pain is getting worse. Get help right away if: You have weakness, numbness, or tingling in your legs or feet. You lose control of your bladder or bowels. Summary Sacroiliac (SI) joint dysfunction is a condition that causes inflammation on one or both sides of the SI joint. This condition causes deep aching or burning pain in the low back. In some cases, the pain may also spread into one or both buttocks, hips, or thighs. Treatment depends on the cause and severity of your condition. It may include medicines to reduce pain and swelling or to relax muscles. This information is not intended to replace advice given to you by your health care provider. Make sure you discuss any questions you have with your health care provider. Document Revised: 03/09/2020 Document Reviewed: 03/09/2020 Elsevier Patient Education  2022 [employer].

## 2021-10-23 ENCOUNTER — Telehealth: Payer: Self-pay | Admitting: Family Medicine

## 2021-10-23 ENCOUNTER — Other Ambulatory Visit (HOSPITAL_BASED_OUTPATIENT_CLINIC_OR_DEPARTMENT_OTHER): Payer: Self-pay

## 2021-10-23 MED ORDER — METHOCARBAMOL 500 MG PO TABS
500.0000 mg | ORAL_TABLET | Freq: Three times a day (TID) | ORAL | 1 refills | Status: DC | PRN
  Filled 2021-10-23: qty 30, 10d supply, fill #0

## 2021-10-23 NOTE — Telephone Encounter (Signed)
Changed skelaxin to formulary robaxin d/t cost.

## 2021-11-07 ENCOUNTER — Ambulatory Visit: Payer: No Typology Code available for payment source | Admitting: Family Medicine

## 2021-11-15 ENCOUNTER — Other Ambulatory Visit (HOSPITAL_BASED_OUTPATIENT_CLINIC_OR_DEPARTMENT_OTHER): Payer: Self-pay

## 2021-12-12 ENCOUNTER — Other Ambulatory Visit (HOSPITAL_BASED_OUTPATIENT_CLINIC_OR_DEPARTMENT_OTHER): Payer: Self-pay

## 2021-12-21 ENCOUNTER — Other Ambulatory Visit (HOSPITAL_BASED_OUTPATIENT_CLINIC_OR_DEPARTMENT_OTHER): Payer: Self-pay

## 2021-12-27 ENCOUNTER — Other Ambulatory Visit: Payer: Self-pay | Admitting: Family Medicine

## 2021-12-27 ENCOUNTER — Other Ambulatory Visit (HOSPITAL_BASED_OUTPATIENT_CLINIC_OR_DEPARTMENT_OTHER): Payer: Self-pay

## 2021-12-28 ENCOUNTER — Other Ambulatory Visit (HOSPITAL_BASED_OUTPATIENT_CLINIC_OR_DEPARTMENT_OTHER): Payer: Self-pay

## 2022-01-08 ENCOUNTER — Other Ambulatory Visit (HOSPITAL_BASED_OUTPATIENT_CLINIC_OR_DEPARTMENT_OTHER): Payer: Self-pay

## 2022-01-25 ENCOUNTER — Other Ambulatory Visit (HOSPITAL_BASED_OUTPATIENT_CLINIC_OR_DEPARTMENT_OTHER): Payer: Self-pay

## 2022-01-28 ENCOUNTER — Other Ambulatory Visit (HOSPITAL_BASED_OUTPATIENT_CLINIC_OR_DEPARTMENT_OTHER): Payer: Self-pay

## 2022-02-04 ENCOUNTER — Ambulatory Visit (INDEPENDENT_AMBULATORY_CARE_PROVIDER_SITE_OTHER): Payer: No Typology Code available for payment source | Admitting: Family Medicine

## 2022-02-04 ENCOUNTER — Other Ambulatory Visit (HOSPITAL_BASED_OUTPATIENT_CLINIC_OR_DEPARTMENT_OTHER): Payer: Self-pay

## 2022-02-04 ENCOUNTER — Encounter: Payer: Self-pay | Admitting: Family Medicine

## 2022-02-04 VITALS — BP 122/76 | HR 57 | Temp 97.7°F | Ht 64.0 in | Wt 163.5 lb

## 2022-02-04 DIAGNOSIS — H6121 Impacted cerumen, right ear: Secondary | ICD-10-CM | POA: Diagnosis not present

## 2022-02-04 DIAGNOSIS — J301 Allergic rhinitis due to pollen: Secondary | ICD-10-CM | POA: Diagnosis not present

## 2022-02-04 MED ORDER — AZELASTINE HCL 0.1 % NA SOLN
2.0000 | Freq: Two times a day (BID) | NASAL | 12 refills | Status: DC
  Filled 2022-02-04: qty 30, 25d supply, fill #0

## 2022-02-04 NOTE — Patient Instructions (Signed)
It was very nice to see you today! ? ?Peroxide in R ear.    Astelin nasal and claritin D ? ? ?PLEASE NOTE: ? ?If you had any lab tests please let us know if you have not heard back within a few days. You may see your results on MyChart before we have a chance to review them but we will give you a call once they are reviewed by [place]. If we ordered any referrals today, please let us know if you have not heard from their office within the next week.  ? ?Please try these tips to maintain a healthy lifestyle: ? ?Eat most of your calories during the day when you are active. Eliminate processed foods including packaged sweets (pies, cakes, cookies), reduce intake of potatoes, white bread, white pasta, and white rice. Look for whole grain options, oat flour or almond flour. ? ?Each meal should contain half fruits/vegetables, one quarter protein, and one quarter carbs (no bigger than a computer mouse). ? ?Cut down on sweet beverages. This includes juice, soda, and sweet tea. Also watch fruit intake, though this is a healthier sweet option, it still contains natural sugar! Limit to 3 servings daily. ? ?Drink at least 1 glass of water with each meal and aim for at least 8 glasses per day ? ?Exercise at least 150 minutes every week.   ?

## 2022-02-04 NOTE — Progress Notes (Signed)
? ?Subjective:  ? ? ? Patient ID: Molly Park, female    DOB: [birth year], 61 y.o.   MRN: [id number] ? ?Chief Complaint  ?Patient presents with  ? Nasal Congestion  ? Sore Throat  ?  Wakes up with sore throat and then it goes away  ? Facial Pain  ?  Started Friday, went away and came back  ? ? ?HPI ?Chief complaint: congestion ?Symptom onset: 3/24 ?Pertinent positives: sore throat,congestion, face pain, vertigo, R ear pain, post nasal drip ?Pertinent negatives: no f/c/cough/vomiting ?Treatments tried: benadryl. Sudafed ?Vaccine status: UTD ?Sick exposure: none  ? ?Health Maintenance Due  ?Topic Date Due  ? Zoster Vaccines- Shingrix (2 of 2) 03/17/2019  ? COVID-19 Vaccine (3 - Pfizer risk series) 01/03/2020  ? ? ?Past Medical History:  ?Diagnosis Date  ? Bilateral edema of lower extremity   ? tried on low dose lasix by prior provider  ? Chicken pox   ? Closed fracture of lower end of right radius with routine healing 08/18/2019  ? Dysphagia 07/22/2018  ? Hemorrhoid   ? HSV infection   ? Hypothyroidism   ? PONV (postoperative nausea and vomiting)   ? Rotator cuff impingement syndrome of right shoulder   ? ? ?Past Surgical History:  ?Procedure Laterality Date  ? APPENDECTOMY  1973  ? BREAST EXCISIONAL BIOPSY Right 2014  ? EXAM UNDER ANESTHESIA WITH MANIPULATION OF SHOULDER Right 01/17/2020  ? Procedure: Right shoulder manipulation under anesthesia;  Surgeon: Tania Ade, MD;  Location: [place];  Service: Orthopedics;  Laterality: Right;  ? HEMORRHOID SURGERY    ? OPEN REDUCTION INTERNAL FIXATION (ORIF) DISTAL RADIAL FRACTURE Right 08/12/2019  ? Procedure: OPEN REDUCTION INTERNAL FIXATION (ORIF) DISTAL RADIAL FRACTURE;  Surgeon: Leanora Cover, MD;  Location: [facility];  Service: Orthopedics;  Laterality: Right;  ? SHOULDER INJECTION Right 01/17/2020  ? Procedure: SHOULDER STEROID INJECTION;  Surgeon: Tania Ade, MD;  Location: [facility];  Service: Orthopedics;   Laterality: Right;  ? TUBAL LIGATION  2004  ? ? ?Outpatient Medications Prior to Visit  ?Medication Sig Dispense Refill  ? atorvastatin (LIPITOR) 20 MG tablet Take 1 tablet (20 mg total) by mouth daily. 90 tablet 3  ? Cholecalciferol (VITAMIN D) 50 MCG (2000 UT) tablet Take 2,000 Units by mouth daily.    ? Diclofenac Sodium CR 100 MG 24 hr tablet TAKE 1 TABLET (100 MG TOTAL) BY MOUTH DAILY. 90 tablet 3  ? gabapentin (NEURONTIN) 300 MG capsule TAKE 1 CAPSULE (300 MG TOTAL) BY MOUTH THREE TIMES DAILY AS NEEDED FOR NERVE PAIN. 90 capsule 3  ? loratadine-pseudoephedrine (SM LORATA-DINE D) 10-240 MG 24 hr tablet Take 1 tablet by mouth daily. 30 tablet 2  ? methocarbamol (ROBAXIN) 500 MG tablet Take 1 tablet (500 mg total) by mouth every 8 (eight) hours as needed for muscle spasms. 30 tablet 1  ? omeprazole (PRILOSEC) 40 MG capsule Take 1 capsule (40 mg total) by mouth daily. 90 capsule 3  ? SYNTHROID 100 MCG tablet TAKE 1 TABLET (100 MCG TOTAL) BY MOUTH DAILY BEFORE BREAKFAST. 90 tablet 1  ? venlafaxine XR (EFFEXOR XR) 150 MG 24 hr capsule Take 1 capsule (150 mg total) by mouth daily with breakfast. 90 capsule 1  ? predniSONE (DELTASONE) 20 MG tablet 60 mg x3d, 40 mg x3d, 20 mg x2d, 10 mg x2d (Patient not taking: Reported on 02/04/2022) 18 tablet 0  ? ?No facility-administered medications prior to visit.  ? ? ?No Known  Allergies ?ROS neg/noncontributory except as noted HPI/below ? ? ?   ?Objective:  ?  ? ?BP 122/76   Pulse (!) 57   Temp 97.7 ?F (36.5 ?C) (Temporal)   Ht 5\' 4"  (1.626 m)   Wt 163 lb 8 oz (74.2 kg)   LMP  (LMP Unknown)   SpO2 96%   BMI 28.06 kg/m?  ?Wt Readings from Last 3 Encounters:  ?02/04/22 163 lb 8 oz (74.2 kg)  ?10/22/21 177 lb (80.3 kg)  ?09/17/21 176 lb (79.8 kg)  ? ? ?Physical Exam  ? ?Gen: WDWN NAD ?HEENT: NCAT, conjunctiva not injected, sclera nonicteric ?TM WNL L.  R wax-irrigated.  Got some wax out, but still some deep and hard so will do again tomorrow, OP moist, no exudates .  Sinuses  tender ?NECK:  supple, no thyromegaly, no nodes, no carotid bruits ?CARDIAC: RRR, S1S2+, no murmur. DP 2+B ?EXT:  no edema ?MSK: no gross abnormalities.  ?NEURO: A&O x3.  CN II-XII intact.  ?PSYCH: normal mood. Good eye contact ? ?   ?Assessment & Plan:  ? ?Problem List Items Addressed This Visit   ?None ?Visit Diagnoses   ? ? Non-seasonal allergic rhinitis due to pollen    -  Primary  ? Excessive ear wax, right      ? ?  ? Allergic rhinitis-refill claritin D and add astelin.  Consider abx ?Wax R-irreg a lot-got some out.  Still some in there.  Use H2O2 tonight and will finish tomorrow ? ?Meds ordered this encounter  ?Medications  ? azelastine (ASTELIN) 0.1 % nasal spray  ?  Sig: Place 2 sprays into both nostrils 2 (two) times daily.  ?  Dispense:  30 mL  ?  Refill:  12  ? ? ?Wellington Hampshire, MD ? ?

## 2022-02-14 ENCOUNTER — Encounter (HOSPITAL_BASED_OUTPATIENT_CLINIC_OR_DEPARTMENT_OTHER): Payer: Self-pay

## 2022-02-15 ENCOUNTER — Other Ambulatory Visit (HOSPITAL_BASED_OUTPATIENT_CLINIC_OR_DEPARTMENT_OTHER): Payer: Self-pay

## 2022-02-22 ENCOUNTER — Other Ambulatory Visit (HOSPITAL_BASED_OUTPATIENT_CLINIC_OR_DEPARTMENT_OTHER): Payer: Self-pay

## 2022-02-22 ENCOUNTER — Ambulatory Visit (INDEPENDENT_AMBULATORY_CARE_PROVIDER_SITE_OTHER): Payer: No Typology Code available for payment source | Admitting: Family Medicine

## 2022-02-22 ENCOUNTER — Encounter: Payer: Self-pay | Admitting: Family Medicine

## 2022-02-22 VITALS — BP 107/71 | HR 71 | Temp 98.0°F | Ht 64.0 in | Wt 162.8 lb

## 2022-02-22 DIAGNOSIS — E782 Mixed hyperlipidemia: Secondary | ICD-10-CM

## 2022-02-22 DIAGNOSIS — R232 Flushing: Secondary | ICD-10-CM | POA: Diagnosis not present

## 2022-02-22 DIAGNOSIS — E559 Vitamin D deficiency, unspecified: Secondary | ICD-10-CM | POA: Diagnosis not present

## 2022-02-22 DIAGNOSIS — K635 Polyp of colon: Secondary | ICD-10-CM

## 2022-02-22 DIAGNOSIS — R7309 Other abnormal glucose: Secondary | ICD-10-CM

## 2022-02-22 DIAGNOSIS — Z23 Encounter for immunization: Secondary | ICD-10-CM | POA: Diagnosis not present

## 2022-02-22 DIAGNOSIS — Z79899 Other long term (current) drug therapy: Secondary | ICD-10-CM

## 2022-02-22 DIAGNOSIS — M1990 Unspecified osteoarthritis, unspecified site: Secondary | ICD-10-CM

## 2022-02-22 DIAGNOSIS — E034 Atrophy of thyroid (acquired): Secondary | ICD-10-CM

## 2022-02-22 DIAGNOSIS — Z1231 Encounter for screening mammogram for malignant neoplasm of breast: Secondary | ICD-10-CM

## 2022-02-22 DIAGNOSIS — Z0000 Encounter for general adult medical examination without abnormal findings: Secondary | ICD-10-CM

## 2022-02-22 DIAGNOSIS — E663 Overweight: Secondary | ICD-10-CM | POA: Insufficient documentation

## 2022-02-22 DIAGNOSIS — Z1211 Encounter for screening for malignant neoplasm of colon: Secondary | ICD-10-CM

## 2022-02-22 DIAGNOSIS — M545 Low back pain, unspecified: Secondary | ICD-10-CM

## 2022-02-22 DIAGNOSIS — K219 Gastro-esophageal reflux disease without esophagitis: Secondary | ICD-10-CM | POA: Insufficient documentation

## 2022-02-22 LAB — COMPREHENSIVE METABOLIC PANEL
ALT: 22 U/L (ref 0–35)
AST: 21 U/L (ref 0–37)
Albumin: 3.9 g/dL (ref 3.5–5.2)
Alkaline Phosphatase: 76 U/L (ref 39–117)
BUN: 16 mg/dL (ref 6–23)
CO2: 30 mEq/L (ref 19–32)
Calcium: 9.1 mg/dL (ref 8.4–10.5)
Chloride: 104 mEq/L (ref 96–112)
Creatinine, Ser: 0.68 mg/dL (ref 0.40–1.20)
GFR: 95.56 mL/min (ref >60.00)
Glucose, Bld: 82 mg/dL (ref 70–99)
Potassium: 4.4 mEq/L (ref 3.5–5.1)
Sodium: 139 mEq/L (ref 135–145)
Total Bilirubin: 0.4 mg/dL (ref 0.2–1.2)
Total Protein: 6.3 g/dL (ref 6.0–8.3)

## 2022-02-22 LAB — CBC
HCT: 37.6 % (ref 36.0–46.0)
Hemoglobin: 12 g/dL (ref 12.0–15.0)
MCHC: 31.8 g/dL (ref 30.0–36.0)
MCV: 88.5 fl (ref 78.0–100.0)
Platelets: 356 10*3/uL (ref 150.0–400.0)
RBC: 4.26 Mil/uL (ref 3.87–5.11)
RDW: 15.1 % (ref 11.5–15.5)
WBC: 7 10*3/uL (ref 4.0–10.5)

## 2022-02-22 LAB — TSH: TSH: 2.87 u[IU]/mL (ref 0.35–5.50)

## 2022-02-22 LAB — LIPID PANEL
Cholesterol: 190 mg/dL (ref 0–200)
HDL: 69.5 mg/dL (ref >39.00)
LDL Cholesterol: 99 mg/dL (ref 0–99)
NonHDL: 120.11
Total CHOL/HDL Ratio: 3
Triglycerides: 105 mg/dL (ref 0.0–149.0)
VLDL: 21 mg/dL (ref 0.0–40.0)

## 2022-02-22 LAB — T4, FREE: Free T4: 0.84 ng/dL (ref 0.60–1.60)

## 2022-02-22 LAB — HEMOGLOBIN A1C: Hgb A1c MFr Bld: 6.2 % (ref 4.6–6.5)

## 2022-02-22 LAB — VITAMIN D 25 HYDROXY (VIT D DEFICIENCY, FRACTURES): VITD: 31.12 ng/mL (ref 30.00–100.00)

## 2022-02-22 MED ORDER — ATORVASTATIN CALCIUM 20 MG PO TABS
20.0000 mg | ORAL_TABLET | Freq: Every day | ORAL | 4 refills | Status: DC
  Filled 2022-02-22 – 2022-03-14 (×2): qty 90, 90d supply, fill #0
  Filled 2022-06-17: qty 90, 90d supply, fill #1
  Filled 2022-09-12: qty 90, 90d supply, fill #2
  Filled 2022-12-11: qty 90, 90d supply, fill #3

## 2022-02-22 MED ORDER — VENLAFAXINE HCL ER 150 MG PO CP24
150.0000 mg | ORAL_CAPSULE | Freq: Every day | ORAL | 3 refills | Status: DC
  Filled 2022-02-22 – 2022-03-29 (×2): qty 90, 90d supply, fill #0
  Filled 2022-06-26: qty 90, 90d supply, fill #1
  Filled 2022-09-25: qty 90, 90d supply, fill #2
  Filled 2022-12-24: qty 90, 90d supply, fill #3

## 2022-02-22 MED ORDER — DICLOFENAC SODIUM ER 100 MG PO TB24
ORAL_TABLET | Freq: Every day | ORAL | 3 refills | Status: DC
  Filled 2022-02-22 – 2022-03-14 (×2): qty 90, 90d supply, fill #0
  Filled 2022-06-17: qty 90, 90d supply, fill #1
  Filled 2022-09-12: qty 90, 90d supply, fill #2
  Filled 2022-12-11: qty 90, 90d supply, fill #3

## 2022-02-22 MED ORDER — SM LORATA-DINE D 10-240 MG PO TB24
1.0000 | ORAL_TABLET | Freq: Every day | ORAL | 2 refills | Status: DC
  Filled 2022-02-22: qty 30, 30d supply, fill #0

## 2022-02-22 MED ORDER — OMEPRAZOLE 40 MG PO CPDR
40.0000 mg | DELAYED_RELEASE_CAPSULE | Freq: Every day | ORAL | 3 refills | Status: DC
  Filled 2022-02-22 – 2022-03-29 (×2): qty 90, 90d supply, fill #0
  Filled 2022-06-26: qty 90, 90d supply, fill #1
  Filled 2022-09-25: qty 90, 90d supply, fill #2
  Filled 2022-12-24: qty 90, 90d supply, fill #3

## 2022-02-22 NOTE — Patient Instructions (Signed)
?Great to see you today.  ?I have refilled the medication(s) we provide.  ? ?If labs were collected, we will inform you of lab results once received either by echart message or telephone call.  ? - echart message- for normal results that have been seen by the patient already.  ? - telephone call: abnormal results or if patient has not viewed results in their echart. ? ? ?No follow-ups on file. ? ? ?Health Maintenance, Female ?Adopting a healthy lifestyle and getting preventive care are important in promoting health and wellness. Ask your health care provider about: ?The right schedule for you to have regular tests and exams. ?Things you can do on your own to prevent diseases and keep yourself healthy. ?What should I know about diet, weight, and exercise? ?Eat a healthy diet ? ?Eat a diet that includes plenty of vegetables, fruits, low-fat dairy products, and lean protein. ?Do not eat a lot of foods that are high in solid fats, added sugars, or sodium. ?Maintain a healthy weight ?Body mass index (BMI) is used to identify weight problems. It estimates body fat based on height and weight. Your health care provider can help determine your BMI and help you achieve or maintain a healthy weight. ?Get regular exercise ?Get regular exercise. This is one of the most important things you can do for your health. Most adults should: ?Exercise for at least 150 minutes each week. The exercise should increase your heart rate and make you sweat (moderate-intensity exercise). ?Do strengthening exercises at least twice a week. This is in addition to the moderate-intensity exercise. ?Spend less time sitting. Even light physical activity can be beneficial. ?Watch cholesterol and blood lipids ?Have your blood tested for lipids and cholesterol at 59 years of age, then have this test every 5 years. ?Have your cholesterol levels checked more often if: ?Your lipid or cholesterol levels are high. ?You are older than 59 years of age. ?You are  at high risk for heart disease. ?What should I know about cancer screening? ?Depending on your health history and family history, you may need to have cancer screening at various ages. This may include screening for: ?Breast cancer. ?Cervical cancer. ?Colorectal cancer. ?Skin cancer. ?Lung cancer. ?What should I know about heart disease, diabetes, and high blood pressure? ?Blood pressure and heart disease ?High blood pressure causes heart disease and increases the risk of stroke. This is more likely to develop in people who have high blood pressure readings or are overweight. ?Have your blood pressure checked: ?Every 3-5 years if you are 18-39 years of age. ?Every year if you are 40 years old or older. ?Diabetes ?Have regular diabetes screenings. This checks your fasting blood sugar level. Have the screening done: ?Once every three years after age 40 if you are at a normal weight and have a low risk for diabetes. ?More often and at a younger age if you are overweight or have a high risk for diabetes. ?What should I know about preventing infection? ?Hepatitis B ?If you have a higher risk for hepatitis B, you should be screened for this virus. Talk with your health care provider to find out if you are at risk for hepatitis B infection. ?Hepatitis C ?Testing is recommended for: ?Everyone born from 1945 through 1965. ?Anyone with known risk factors for hepatitis C. ?Sexually transmitted infections (STIs) ?Get screened for STIs, including gonorrhea and chlamydia, if: ?You are sexually active and are younger than 59 years of age. ?You are older than 59 years   of age and your health care provider tells you that you are at risk for this type of infection. ?Your sexual activity has changed since you were last screened, and you are at increased risk for chlamydia or gonorrhea. Ask your health care provider if you are at risk. ?Ask your health care provider about whether you are at high risk for HIV. Your health care provider  may recommend a prescription medicine to help prevent HIV infection. If you choose to take medicine to prevent HIV, you should first get tested for HIV. You should then be tested every 3 months for as long as you are taking the medicine. ?Pregnancy ?If you are about to stop having your period (premenopausal) and you may become pregnant, seek counseling before you get pregnant. ?Take 400 to 800 micrograms (mcg) of folic acid every day if you become pregnant. ?Ask for birth control (contraception) if you want to prevent pregnancy. ?Osteoporosis and menopause ?Osteoporosis is a disease in which the bones lose minerals and strength with aging. This can result in bone fractures. If you are 65 years old or older, or if you are at risk for osteoporosis and fractures, ask your health care provider if you should: ?Be screened for bone loss. ?Take a calcium or vitamin D supplement to lower your risk of fractures. ?Be given hormone replacement therapy (HRT) to treat symptoms of menopause. ?Follow these instructions at home: ?Alcohol use ?Do not drink alcohol if: ?Your health care provider tells you not to drink. ?You are pregnant, may be pregnant, or are planning to become pregnant. ?If you drink alcohol: ?Limit how much you have to: ?0-1 drink a day. ?Know how much alcohol is in your drink. In the [place] one drink equals one 12 oz bottle of beer (355 mL), one 5 oz glass of wine (148 mL), or one 1? oz glass of hard liquor (44 mL). ?Lifestyle ?Do not use any products that contain nicotine or tobacco. These products include cigarettes, chewing tobacco, and vaping devices, such as e-cigarettes. If you need help quitting, ask your health care provider. ?Do not use street drugs. ?Do not share needles. ?Ask your health care provider for help if you need support or information about quitting drugs. ?General instructions ?Schedule regular health, dental, and eye exams. ?Stay current with your vaccines. ?Tell your health care provider  if: ?You often feel depressed. ?You have ever been abused or do not feel safe at home. ?Summary ?Adopting a healthy lifestyle and getting preventive care are important in promoting health and wellness. ?Follow your health care provider's instructions about healthy diet, exercising, and getting tested or screened for diseases. ?Follow your health care provider's instructions on monitoring your cholesterol and blood pressure. ?This information is not intended to replace advice given to you by your health care provider. Make sure you discuss any questions you have with your health care provider. ?Document Revised: 03/19/2021 Document Reviewed: 03/19/2021 ?Elsevier Patient Education ? 2023 [employer] ? ?

## 2022-02-22 NOTE — Progress Notes (Signed)
? ?This visit occurred during the SARS-CoV-2 public health emergency.  Safety protocols were in place, including screening questions prior to the visit, additional usage of staff PPE, and extensive cleaning of exam room while observing appropriate contact time as indicated for disinfecting solutions.  ? ? ?Patient ID: Molly Park, female  DOB: [birth year], 61 y.o.   MRN: [id number] ?Patient Care Team  ?  Relationship Specialty Notifications Start End  ?Ma Hillock, DO PCP - General Family Medicine  01/14/17   ?Salvadore Dom, MD Consulting Physician Obstetrics and Gynecology  01/14/18   ?Gatha Mayer, MD Consulting Physician Gastroenterology  02/21/21   ? ? ?Chief Complaint  ?Patient presents with  ? Annual Exam  ?  Pt is fasting;   ? ? ?Subjective: ?Molly Park is a 61 y.o.  Female  present for CPE/CMC ?All past medical history, surgical history, allergies, family history, immunizations, medications and social history were updated in the electronic medical record today. ?All recent labs, ED visits and hospitalizations within the last year were reviewed. ? ?Health maintenance:  ?Colonoscopy: no fhx, screen completed 01/21/2014 w/ polyps,  at [facility]. She was told 5 year follow up. Now has scheduled with Dr. Carlean Purl.  ?Mammogram: completed: 01/2021,  [facility], left rptd in October>cleared to return to annual screen. ?Cervical cancer screening: last pap: 2019, results: Dr. Talbert Nan  Neg hpv/nl pap. ?Immunizations: tdap 01/2017 UTD. Influenza 08/11/2021(encouraged yearly). Shingrix #1 01/2019> did not return for 2nd d/t covid> agreed to complete today. She has received her covid series.  ?Infectious disease: HIV & Hep C completed ?Assistive device: none ?Oxygen [id number] ?Patient has a Dental home. ?Hospitalizations/ED visits: reviewed ? ?Hypothyroidism, unspecified type ?Thyroid levels have been stable.   Patient reports compliance with levothyroxine 100 mcg daily.l;abs due today ?   ?Hot flashes/focus ?Patient reports she has seen improvement in her focus and her hot flashes are stable  She reports compliance with Effexor 150 mg daily. ?  ?Mixed hyperlipidemia ?She is compliant with  atorvastatin.  ?  ?Lumbar pain/arthritis ?She reports she has continued diclofenac once daily with food.  She denies any worsening of reflux symptoms.   ?  ?GERD: ?Patient reports symptoms are well controlledon omeprazole 40 mg daily.  Symptoms do return if she discontinues omeprazole.  Symptoms present prior to start of NSAIDs and have not worsened since. ?  ? ? ?  02/22/2022  ?  8:08 AM 09/17/2021  ?  9:39 AM 02/21/2021  ?  8:03 AM 02/13/2021  ?  1:30 PM 01/26/2020  ?  8:14 AM  ?Depression screen PHQ 2/9  ?Decreased Interest 0 0 0 1 0  ?Down, Depressed, Hopeless 0 0 0 0 0  ?PHQ - 2 Score 0 0 0 1 0  ?Altered sleeping     1  ?Tired, decreased energy     0  ?Change in appetite     0  ?Feeling bad or failure about yourself      0  ?Trouble concentrating     0  ?Moving slowly or fidgety/restless     0  ?Suicidal thoughts     0  ?PHQ-9 Score     1  ?Difficult doing work/chores     Not difficult at all  ? ? ?  01/20/2019  ?  8:55 AM 10/27/2018  ?  3:59 PM  ?GAD 7 : Generalized Anxiety Score  ?Nervous, Anxious, on Edge 0 1  ?Control/stop worrying 0 1  ?Worry too much -  different things 0 1  ?Trouble relaxing 0 1  ?Restless 0 1  ?Easily annoyed or irritable 0 0  ?Afraid - awful might happen 0 0  ?Total GAD 7 Score 0 5  ?Anxiety Difficulty Not difficult at all Not difficult at all  ? ? ? ?Immunization History  ?Administered Date(s) Administered  ? Hepatitis A, Ped/Adol-2 Dose 12/07/2012  ? Influenza, Seasonal, Injecte, Preservative Fre 09/15/2014  ? Influenza-Unspecified 08/11/2016, 09/15/2018, 08/11/2020, 08/11/2021  ? PFIZER(Purple Top)SARS-COV-2 Vaccination 11/16/2019, 12/06/2019  ? Tdap 01/14/2017  ? Zoster Recombinat (Shingrix) 01/20/2019, 02/22/2022  ? ? ? ?Past Medical History:  ?Diagnosis Date  ? Bilateral edema of lower  extremity   ? tried on low dose lasix by prior provider  ? Chicken pox   ? Closed fracture of lower end of right radius with routine healing 08/18/2019  ? Dysphagia 07/22/2018  ? Hemorrhoid   ? History of COVID-19 02/15/2021  ? HSV infection   ? Hypothyroidism   ? PONV (postoperative nausea and vomiting)   ? Rotator cuff impingement syndrome of right shoulder   ? ?No Known Allergies ?Past Surgical History:  ?Procedure Laterality Date  ? APPENDECTOMY  1973  ? BREAST EXCISIONAL BIOPSY Right 2014  ? EXAM UNDER ANESTHESIA WITH MANIPULATION OF SHOULDER Right 01/17/2020  ? Procedure: Right shoulder manipulation under anesthesia;  Surgeon: Tania Ade, MD;  Location: [place] [facility];  Service: Orthopedics;  Laterality: Right;  ? HEMORRHOID SURGERY    ? OPEN REDUCTION INTERNAL FIXATION (ORIF) DISTAL RADIAL FRACTURE Right 08/12/2019  ? Procedure: OPEN REDUCTION INTERNAL FIXATION (ORIF) DISTAL RADIAL FRACTURE;  Surgeon: Leanora Cover, MD;  Location: [place];  Service: Orthopedics;  Laterality: Right;  ? SHOULDER INJECTION Right 01/17/2020  ? Procedure: SHOULDER STEROID INJECTION;  Surgeon: Tania Ade, MD;  Location: [place];  Service: Orthopedics;  Laterality: Right;  ? TUBAL LIGATION  2004  ? ?Family History  ?Problem Relation Age of Onset  ? Diabetes Mother   ? Valvular heart disease Father   ? Breast cancer Maternal Aunt   ?     6  ? ?Social History  ? ?Social History Narrative  ? Patient is married to Molly Park. They have 5 children btw them.  ? BA degree. 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  ? Drinks caffeine.  ? Wears a seatbelt, wears a bicycle helmet, smoke detector in the home.  ? Firearms in the home.  ? Exercises routinely.  ? Feels safe in her relationships.  ? ? ?Allergies as of 02/22/2022   ?No Known Allergies ?  ? ?  ?Medication List  ?  ? ?  ? Accurate as of February 22, 2022  8:39 AM. If you have any questions, ask your nurse or  doctor.  ?  ?  ? ?  ? ?STOP taking these medications   ? ?azelastine 0.1 % nasal spray ?Commonly known as: ASTELIN ?Stopped by: Howard Pouch, DO ?  ?methocarbamol 500 MG tablet ?Commonly known as: Robaxin ?Stopped by: Howard Pouch, DO ?  ?predniSONE 20 MG tablet ?Commonly known as: DELTASONE ?Stopped by: Howard Pouch, DO ?  ? ?  ? ?TAKE these medications   ? ?atorvastatin 20 MG tablet ?Commonly known as: LIPITOR ?Take 1 tablet (20 mg total) by mouth daily. ?  ?Diclofenac Sodium CR 100 MG 24 hr tablet ?TAKE 1 TABLET (100 MG TOTAL) BY MOUTH DAILY. ?  ?gabapentin 300 MG capsule ?Commonly known as: NEURONTIN ?TAKE 1  CAPSULE (300 MG TOTAL) BY MOUTH THREE TIMES DAILY AS NEEDED FOR NERVE PAIN. ?  ?omeprazole 40 MG capsule ?Commonly known as: PRILOSEC ?Take 1 capsule (40 mg total) by mouth daily. ?  ?SM Lorata-dine D 10-240 MG 24 hr tablet ?Generic drug: loratadine-pseudoephedrine ?Take 1 tablet by mouth daily. ?  ?Synthroid 100 MCG tablet ?Generic drug: levothyroxine ?TAKE 1 TABLET (100 MCG TOTAL) BY MOUTH DAILY BEFORE BREAKFAST. ?  ?venlafaxine XR 150 MG 24 hr capsule ?Commonly known as: Effexor XR ?Take 1 capsule (150 mg total) by mouth daily with breakfast. ?  ?Vitamin D 50 MCG (2000 UT) tablet ?Take 2,000 Units by mouth daily. ?  ? ?  ? ? ?All past medical history, surgical history, allergies, family history, immunizations andmedications were updated in the EMR today and reviewed under the history and medication portions of their EMR.    ? ?No results found for this or any previous visit (from the past 2160 hour(s)). ? ? ? ?ROS ?14 pt review of systems performed and negative (unless mentioned in an HPI) ?Objective: ?BP 107/71   Pulse 71   Temp 98 ?F (36.7 ?C) (Oral)   Ht 5\' 4"  (1.626 m)   Wt 162 lb 12.8 oz (73.8 kg)   LMP  (LMP Unknown)   SpO2 99%   BMI 27.94 kg/m?  ?Physical Exam ?Vitals and nursing note reviewed.  ?Constitutional:   ?   General: She is not in acute distress. ?   Appearance: Normal appearance.  She is not ill-appearing or toxic-appearing.  ?HENT:  ?   Head: Normocephalic and atraumatic.  ?   Right Ear: Tympanic membrane, ear canal and external ear normal. There is no impacted cerumen.  ?   Left Ear: Tym

## 2022-02-25 ENCOUNTER — Other Ambulatory Visit (HOSPITAL_BASED_OUTPATIENT_CLINIC_OR_DEPARTMENT_OTHER): Payer: Self-pay

## 2022-03-08 ENCOUNTER — Ambulatory Visit
Admission: RE | Admit: 2022-03-08 | Discharge: 2022-03-08 | Disposition: A | Discharge status: Home / Self Care | Payer: No Typology Code available for payment source | Source: Ambulatory Visit | Attending: Family Medicine | Admitting: Family Medicine

## 2022-03-08 ENCOUNTER — Ambulatory Visit: Payer: No Typology Code available for payment source

## 2022-03-08 DIAGNOSIS — Z1231 Encounter for screening mammogram for malignant neoplasm of breast: Secondary | ICD-10-CM

## 2022-03-14 ENCOUNTER — Other Ambulatory Visit (HOSPITAL_BASED_OUTPATIENT_CLINIC_OR_DEPARTMENT_OTHER): Payer: Self-pay

## 2022-03-28 ENCOUNTER — Encounter: Payer: Self-pay | Admitting: Podiatry

## 2022-03-28 ENCOUNTER — Ambulatory Visit (INDEPENDENT_AMBULATORY_CARE_PROVIDER_SITE_OTHER): Payer: No Typology Code available for payment source

## 2022-03-28 ENCOUNTER — Ambulatory Visit (INDEPENDENT_AMBULATORY_CARE_PROVIDER_SITE_OTHER): Payer: No Typology Code available for payment source | Admitting: Podiatry

## 2022-03-28 DIAGNOSIS — M205X1 Other deformities of toe(s) (acquired), right foot: Secondary | ICD-10-CM

## 2022-03-28 DIAGNOSIS — M79671 Pain in right foot: Secondary | ICD-10-CM

## 2022-03-28 DIAGNOSIS — M79674 Pain in right toe(s): Secondary | ICD-10-CM

## 2022-03-28 NOTE — Progress Notes (Signed)
  Subjective:  Patient ID: Molly Park, female    DOB: 1960/12/14,   MRN: [id number]  Chief Complaint  Patient presents with   Joint Pain    great toe pain on top of toe/ pt states like a bone    61 y.o. female presents for concern of right great toe pain that has been going on for about 6 months. Relates it has been growing and anything that puts pressure on it is causing pain. Relates the pain is not constant but throbs. . Denies any other pedal complaints. Denies n/v/f/c.   Past Medical History:  Diagnosis Date   Bilateral edema of lower extremity    tried on low dose lasix by prior provider   Chicken pox    Closed fracture of lower end of right radius with routine healing 08/18/2019   Dysphagia 07/22/2018   Hemorrhoid    History of COVID-19 02/15/2021   HSV infection    Hypothyroidism    PONV (postoperative nausea and vomiting)    Rotator cuff impingement syndrome of right shoulder     Objective:  Physical Exam: Vascular: DP/PT pulses 2/4 bilateral. CFT <3 seconds. Normal hair growth on digits. No edema.  Skin. No lacerations or abrasions bilateral feet.  Musculoskeletal: MMT 5/5 bilateral lower extremities in DF, PF, Inversion and Eversion. Deceased ROM in DF of ankle joint. Spurring noted to dorsum of right first MPJ. Tender with ROM of the first MPJ especially on end ROM. No pain medially but some tenderness dorsally to joint.  Neurological: Sensation intact to light touch.   Assessment:   1. Hallux limitus, right      Plan:  Patient was evaluated and treated and all questions answered. -Xrays reviewed. Joint space narrowing of right great toe with spurring noted dorsally. Soft tissue swelling noted in the area of the joint as well.  -Discussed hallux limitus and  treatement options; conservative and  Surgical management; risks, benefits, alternatives discussed. All patient's questions answered. -Continue with Diclofenac.  -Recommend continue with good supportive  shoes and inserts. Discussed stiff soled shoes and use of carbon fiber foot plate.   -Patient to return to office as needed or sooner if condition worsens.   Louann Sjogren, DPM

## 2022-03-29 ENCOUNTER — Other Ambulatory Visit (HOSPITAL_BASED_OUTPATIENT_CLINIC_OR_DEPARTMENT_OTHER): Payer: Self-pay

## 2022-04-17 ENCOUNTER — Other Ambulatory Visit (HOSPITAL_BASED_OUTPATIENT_CLINIC_OR_DEPARTMENT_OTHER): Payer: Self-pay

## 2022-04-17 ENCOUNTER — Other Ambulatory Visit: Payer: Self-pay | Admitting: Family Medicine

## 2022-04-17 DIAGNOSIS — E039 Hypothyroidism, unspecified: Secondary | ICD-10-CM

## 2022-04-18 ENCOUNTER — Other Ambulatory Visit (HOSPITAL_BASED_OUTPATIENT_CLINIC_OR_DEPARTMENT_OTHER): Payer: Self-pay

## 2022-04-18 MED ORDER — SYNTHROID 100 MCG PO TABS
ORAL_TABLET | ORAL | 1 refills | Status: DC
  Filled 2022-04-18 – 2022-04-30 (×2): qty 90, 90d supply, fill #0
  Filled 2022-07-28: qty 90, 90d supply, fill #1

## 2022-04-29 ENCOUNTER — Other Ambulatory Visit (HOSPITAL_BASED_OUTPATIENT_CLINIC_OR_DEPARTMENT_OTHER): Payer: Self-pay

## 2022-05-01 ENCOUNTER — Other Ambulatory Visit (HOSPITAL_BASED_OUTPATIENT_CLINIC_OR_DEPARTMENT_OTHER): Payer: Self-pay

## 2022-06-06 ENCOUNTER — Ambulatory Visit (INDEPENDENT_AMBULATORY_CARE_PROVIDER_SITE_OTHER): Payer: No Typology Code available for payment source

## 2022-06-06 ENCOUNTER — Ambulatory Visit (INDEPENDENT_AMBULATORY_CARE_PROVIDER_SITE_OTHER): Payer: No Typology Code available for payment source | Admitting: Family Medicine

## 2022-06-06 ENCOUNTER — Ambulatory Visit: Payer: Self-pay

## 2022-06-06 VITALS — BP 104/70 | HR 78 | Ht 64.0 in | Wt 159.6 lb

## 2022-06-06 DIAGNOSIS — M25061 Hemarthrosis, right knee: Secondary | ICD-10-CM | POA: Diagnosis not present

## 2022-06-06 DIAGNOSIS — M25562 Pain in left knee: Secondary | ICD-10-CM | POA: Diagnosis not present

## 2022-06-06 DIAGNOSIS — M25561 Pain in right knee: Secondary | ICD-10-CM | POA: Diagnosis not present

## 2022-06-06 NOTE — Progress Notes (Signed)
I, Molly Park, LAT, ATC, am serving as scribe for Dr. [name].  Molly Park is a 61 y.o. female who presents to [facility] at [facility] today for R knee pain and swelling. Pt was on her feet a lot at the office and then sat down for a bit and when she went to stand up, her R knee was stiff and painful. She was last seen by Dr. [name] on 09/07/20 for OMT.  Today, pt reports R knee pain/swelling since Friday, 7/21.  Pt reports both knees will hurt and swelling intermittently, but this instance the pain is mostly in the R knee. She locates her pain to the anterior aspect of both knees.   Mechanical knee symptoms: yes Aggravating factors: squat Treatments tried: ice, HEP, compression sleeve   Pertinent review of systems: No fevers or chills  Relevant historical information: GERD.  Hypothyroidism.   Exam:  BP 104/70   Pulse 78   Ht 5\' 4"  (1.626 m)   Wt 159 lb 9.6 oz (72.4 kg)   LMP  (LMP Unknown)   SpO2 98%   BMI 27.40 kg/m  General: Well Developed, well nourished, and in no acute distress.   MSK: Right knee: Large joint effusion.  Normal-appearing otherwise.  Normal motion pain with extension.   Audible and palpable crepitation present with extension. Tender palpation medial joint line. Stable ligamentous exam. Intact strength pain with extension. Stable ligamentous exam.    Lab and Radiology Results  Procedure: Real-time Ultrasound Guided aspiration of right knee superior lateral patellar space Device: Philips Affiniti 50G Images permanently stored and available for review in PACS Large joint effusion seen on ultrasound prior to aspiration. Moderate meniscus degeneration seen on ultrasound. Verbal informed consent obtained.  Discussed risks and benefits of procedure. Warned about infection, bleeding, hyperglycemia damage to structures among others. Patient expresses understanding and agreement Time-out conducted.   Noted no overlying erythema,  induration, or other signs of local infection.   Skin prepped in a sterile fashion.   Local anesthesia: Topical Ethyl chloride.   With sterile technique and under real time ultrasound guidance: 2 mL of lidocaine injected into subcutaneous tissue superior lateral knee. Skin sterilized with isopropyl alcohol. 18-gauge needle used to access the knee joint and 40 mL of dark red blood aspirated. A decision was made not to proceed with a steroid injection at this time. Completed without difficulty   Advised to call if fevers/chills, erythema, induration, drainage, or persistent bleeding.   Images permanently stored and available for review in the ultrasound unit.  Impression: Technically successful ultrasound guided aspiration.   X-ray images bilateral knees obtained today personally and independently interpreted  Right knee: Severe patellofemoral DJD.  No acute fractures are visible per my read.  Left knee: Moderate to severe patellofemoral DJD.  No acute fractures.  Await formal radiology review    Assessment and Plan: 61 y.o. female with acute severe right knee pain with large hemarthrosis without trauma history is concerning for a radiographically occult fracture or insufficiency fracture.  She has severe patellofemoral degenerative changes on x-ray which may be contributory.  Plan for MRI to further evaluate source of pain and hemarthrosis before proceeding with other treatment.  In the meantime recommend reduce weightbearing and reduce activity level until we have a better sense of the source of pain and especially the source of hemarthrosis in her knee.   PDMP not reviewed this encounter. Orders Placed This Encounter  Procedures   46 LIMITED JOINT  SPACE STRUCTURES LOW BILAT(NO LINKED CHARGES)    Order Specific Question:   Reason for Exam (SYMPTOM  OR DIAGNOSIS REQUIRED)    Answer:   bilateral knee pain    Order Specific Question:   Preferred imaging location?    Answer:   [place]  [facility] Knee AP/LAT W/Sunrise Left    Standing Status:   Future    Number of Occurrences:   1    Standing Expiration Date:   07/07/2022    Order Specific Question:   Reason for Exam (SYMPTOM  OR DIAGNOSIS REQUIRED)    Answer:   bilateral knee pain    Order Specific Question:   Preferred imaging location?    Answer:   [facility]    Order Specific Question:   Is patient pregnant?    Answer:   No   DG Knee AP/LAT W/Sunrise Right    Standing Status:   Future    Number of Occurrences:   1    Standing Expiration Date:   07/07/2022    Order Specific Question:   Reason for Exam (SYMPTOM  OR DIAGNOSIS REQUIRED)    Answer:   bilateral knee pain    Order Specific Question:   Preferred imaging location?    Answer:   [facility]    Order Specific Question:   Is patient pregnant?    Answer:   No   MR Knee Right Wo Contrast    Standing Status:   Future    Standing Expiration Date:   06/07/2023    Order Specific Question:   What is the patient's sedation requirement?    Answer:   No Sedation    Order Specific Question:   Does the patient have a pacemaker or implanted devices?    Answer:   No    Order Specific Question:   Preferred imaging location?    Answer:   [facility] (table limit-350lbs)   No orders of the defined types were placed in this encounter.    Discussed warning signs or symptoms. Please see discharge instructions. Patient expresses understanding.   The above documentation has been reviewed and is accurate and complete [name], M.D.

## 2022-06-06 NOTE — Patient Instructions (Addendum)
Thank you for coming in today.   Please get an Xray today before you leave   We aspirated some blood-filled fluid from your knee.   I recommend you obtained a compression sleeve to help with your joint problems. There are many options on the market however I recommend obtaining a Full Knee Body Helix compression sleeve.  You can find information (including how to appropriate measure yourself for sizing) can be found at www.[email or web address].ch.  Many of these products are health savings account (HSA) eligible.  You can use the compression sleeve at any time throughout the day but is most important to use while being active as well as for 2 hours post-activity.   It is appropriate to ice following activity with the compression sleeve in place.   Please use Voltaren gel (Generic Diclofenac Gel) up to 4x daily for pain as needed.  This is available over-the-counter as both the name brand Voltaren gel and the generic diclofenac gel.   Let's see what the X-ray looks like and decide a treatment plan from there.

## 2022-06-07 NOTE — Progress Notes (Signed)
Left knee x-ray shows severe arthritis at the patella on both sides.  No fracture is seen.  However I have ordered an MRI to better understand the source of the right knee pain.

## 2022-06-07 NOTE — Progress Notes (Signed)
Left knee x-ray shows severe arthritis at the patella on both sides.  No fracture is seen.  However I have ordered an MRI to better understand the source of the right knee pain.

## 2022-06-10 ENCOUNTER — Ambulatory Visit (INDEPENDENT_AMBULATORY_CARE_PROVIDER_SITE_OTHER): Payer: No Typology Code available for payment source

## 2022-06-10 ENCOUNTER — Encounter: Payer: Self-pay | Admitting: Family Medicine

## 2022-06-10 DIAGNOSIS — M25061 Hemarthrosis, right knee: Secondary | ICD-10-CM

## 2022-06-10 DIAGNOSIS — M25561 Pain in right knee: Secondary | ICD-10-CM

## 2022-06-12 NOTE — Progress Notes (Signed)
Knee MRI does not show a fracture which is good news.  However you do have severe arthritis underneath the kneecap.  You do also have a osteochondroma which is a benign cartilage tumor around the kneecap which could interfere with motion of the kneecap. We may want to consider gel shots as a possible option.  I will go ahead and start the authorization process for that now. Recommend return to clinic to go over the results in full detail.

## 2022-06-13 ENCOUNTER — Encounter: Payer: Self-pay | Admitting: Family Medicine

## 2022-06-17 ENCOUNTER — Other Ambulatory Visit (HOSPITAL_BASED_OUTPATIENT_CLINIC_OR_DEPARTMENT_OTHER): Payer: Self-pay

## 2022-06-17 ENCOUNTER — Ambulatory Visit (INDEPENDENT_AMBULATORY_CARE_PROVIDER_SITE_OTHER): Payer: No Typology Code available for payment source | Admitting: Family Medicine

## 2022-06-17 ENCOUNTER — Ambulatory Visit: Payer: Self-pay

## 2022-06-17 VITALS — BP 132/76 | HR 67 | Ht 64.0 in | Wt 163.8 lb

## 2022-06-17 DIAGNOSIS — M25561 Pain in right knee: Secondary | ICD-10-CM

## 2022-06-17 NOTE — Patient Instructions (Addendum)
Thank you for coming in today.   You received an injection today. Seek immediate medical attention if the joint becomes red, extremely painful, or is oozing fluid.   We will work on [place] the gel shots. That is the next step if the cortisone does not last.   If still not better surgery consultation.

## 2022-06-17 NOTE — Progress Notes (Signed)
I, Molly Park, LAT, ATC acting as a scribe for Molly Graham, MD.  Molly Park is a 61 y.o. female who presents to [facility] at [facility] today for f/u R knee pain and MRI review. Pt was last seen by Dr. Denyse Amass on 06/06/22 and dark red blood was aspirated from the R knee and a decision was made to not proceed w/ the steroid injection. Pt was advised to reduct weightbearing and activity and proceeded to MRI. Today, pt reports her R knee is sore. She notes increased pain w/ knee extension and feeling of "crepitus" when she pushing on the anterior aspect of her knee.   Dx imaging: 06/10/22 R knee MRI  06/06/22 R & L knee XR  Pertinent review of systems: No fevers or chills  Relevant historical information: Hypothyroidism   Exam:  BP 132/76   Pulse 67   Ht 5\' 4"  (1.626 m)   Wt 163 lb 12.8 oz (74.3 kg)   LMP  (LMP Unknown)   SpO2 100%   BMI 28.12 kg/m  General: Well Developed, well nourished, and in no acute distress.   MSK: Right knee mild effusion normal motion with crepitation.    Lab and Radiology Results  Procedure: Real-time Ultrasound Guided Injection of right knee superior lateral patellar space Device: Philips Affiniti 50G Images permanently stored and available for review in PACS Verbal informed consent obtained.  Discussed risks and benefits of procedure. Warned about infection, bleeding, hyperglycemia damage to structures among others. Patient expresses understanding and agreement Time-out conducted.   Noted no overlying erythema, induration, or other signs of local infection.   Skin prepped in a sterile fashion.   Local anesthesia: Topical Ethyl chloride.   With sterile technique and under real time ultrasound guidance: 40 mg of Kenalog and 2 mL of Marcaine injected into knee joint. Fluid seen entering the joint capsule.   Completed without difficulty   Pain immediately resolved suggesting accurate placement of the medication.   Advised to  call if fevers/chills, erythema, induration, drainage, or persistent bleeding.   Images permanently stored and available for review in the ultrasound unit.  Impression: Technically successful ultrasound guided injection.    EXAM: MRI OF THE RIGHT KNEE WITHOUT CONTRAST   TECHNIQUE: Multiplanar, multisequence MR imaging of the knee was performed. No intravenous contrast was administered.   COMPARISON:  Radiographs 06/06/2022   FINDINGS: MENISCI   Medial meniscus:  Intact with normal morphology.   Lateral meniscus:  Intact with normal morphology.   LIGAMENTS   Cruciates:  Intact.   Collaterals:  Intact.   CARTILAGE   Patellofemoral: As seen on prior radiographs, there is severe patellofemoral degenerative changes with diffuse chondral thinning, osteophytes and subchondral edema. The patella is laterally subluxed from the trochlear groove.   Medial: Preserved chondral thickness. Small peripheral osteophytes. Probable intraosseous ganglion posteriorly in the medial tibial plateau.   Lateral: Preserved chondral thickness. Small peripheral osteophytes with prominent subchondral cyst formation anteriorly in the lateral tibial plateau. Probable intraosseous ganglion posteriorly in the nonweightbearing aspect of the lateral femoral condyle.   MISCELLANEOUS   Joint: Moderate-sized joint effusion with synovial irregularity. No discrete intra-articular loose bodies are identified.   Popliteal Fossa: The popliteus muscle and tendon are intact. Probable synovial cyst anterior to the popliteal vessels, measuring up to 1.3 cm in diameter. No typical Baker's cyst.   Extensor Mechanism: Intact. Moderate patella alta. As above, there is significant lateral subluxation of the patella from the trochlear groove. The patellar  retinacula appear intact. There is a markedly shallow trochlear groove. Projecting anteriorly from the superior aspect of the medial femoral trochlea, there is  an oval excrescence which measures approximately 1.5 x 1.4 x 1.0 cm. On the T1 weighted images, this contains central fat signal. This appears contiguous with the cartilage on the sagittal and axial images. The tibial tubercle/trochlear groove (TT-TG) distance is 20 mm (abnormal).   Bones: No signs of recent transient patellar dislocation injury or acute fracture.   Other: No other significant periarticular soft tissue findings.   IMPRESSION: 1. Severe patellofemoral degenerative changes with patella alta, lateral subluxation of the patella and marked hypoplasia of the femoral trochlea. No signs of recent transient patellar dislocation injury or other acute osseous injury. 2. Possible osteochondroma of the superior aspect of the medial femoral trochlea which may prevent reduction of the patella. 3. Lesser medial and lateral degenerative changes with peripheral osteophytes. 4. The menisci, cruciate and collateral ligaments are intact.     Electronically Signed   By: Carey Bullocks M.D.   On: 06/11/2022 15:53   I, Molly Park, personally (independently) visualized and performed the interpretation of the images attached in this note.      Assessment and Plan: 61 y.o. female with right knee pain predominantly due to severe patellofemoral degeneration.  Plan for steroid injection today.  Additionally we will work on authorization for hyaluronic acid injections.  Ultimately Maayan likely require knee replacement we will try to delay that as much as possible.  Continue quad strengthening within reason.  Check back as needed.   PDMP not reviewed this encounter. Orders Placed This Encounter  Procedures   [place] LIMITED JOINT SPACE STRUCTURES LOW RIGHT(NO LINKED CHARGES)    Order Specific Question:   Reason for Exam (SYMPTOM  OR DIAGNOSIS REQUIRED)    Answer:   right knee pain    Order Specific Question:   Preferred imaging location?    Answer:   [facility]    No orders of the defined types were placed in this encounter.    Discussed warning signs or symptoms. Please see discharge instructions. Patient expresses understanding.   The above documentation has been reviewed and is accurate and complete Molly Park, M.D.

## 2022-06-27 ENCOUNTER — Other Ambulatory Visit (HOSPITAL_BASED_OUTPATIENT_CLINIC_OR_DEPARTMENT_OTHER): Payer: Self-pay

## 2022-07-26 ENCOUNTER — Other Ambulatory Visit (HOSPITAL_BASED_OUTPATIENT_CLINIC_OR_DEPARTMENT_OTHER): Payer: Self-pay

## 2022-07-29 ENCOUNTER — Other Ambulatory Visit (HOSPITAL_BASED_OUTPATIENT_CLINIC_OR_DEPARTMENT_OTHER): Payer: Self-pay

## 2022-07-31 ENCOUNTER — Other Ambulatory Visit: Payer: Self-pay | Admitting: Family Medicine

## 2022-07-31 ENCOUNTER — Encounter: Payer: Self-pay | Admitting: Family Medicine

## 2022-07-31 ENCOUNTER — Other Ambulatory Visit (HOSPITAL_BASED_OUTPATIENT_CLINIC_OR_DEPARTMENT_OTHER): Payer: Self-pay

## 2022-07-31 MED ORDER — TRIAMCINOLONE ACETONIDE 0.1 % EX CREA
1.0000 | TOPICAL_CREAM | Freq: Two times a day (BID) | CUTANEOUS | 0 refills | Status: DC
  Filled 2022-07-31: qty 30, 15d supply, fill #0

## 2022-07-31 NOTE — Progress Notes (Signed)
Presented w/few day h/o pulling vines.  L arm rash/itching and R arm as well.  Saw it-contact derm.  Will send in triamcinolone cream

## 2022-08-02 ENCOUNTER — Telehealth: Payer: Self-pay | Admitting: Family Medicine

## 2022-08-02 ENCOUNTER — Other Ambulatory Visit (INDEPENDENT_AMBULATORY_CARE_PROVIDER_SITE_OTHER): Payer: No Typology Code available for payment source

## 2022-08-02 DIAGNOSIS — R21 Rash and other nonspecific skin eruption: Secondary | ICD-10-CM | POA: Diagnosis not present

## 2022-08-02 DIAGNOSIS — X58XXXA Exposure to other specified factors, initial encounter: Secondary | ICD-10-CM

## 2022-08-02 MED ORDER — METHYLPREDNISOLONE ACETATE 40 MG/ML IJ SUSP
40.0000 mg | Freq: Once | INTRAMUSCULAR | Status: AC
  Administered 2022-08-02: 40 mg via INTRAMUSCULAR

## 2022-08-02 NOTE — Telephone Encounter (Signed)
Patient with poison oak exposure last week-she has tried over-the-counter antihistamines both first and second generation without significant relief of symptoms-rash on bilateral forearms with vesicles.  Has also tried both cortisone cream and triamcinolone cream without relief  She does have prediabetes and she is aware of the risks of worsening blood sugar short-term as well as blood pressure-we opted to trial injection of Depo-Medrol 40 mg to see if she can get some relief over the weekend-if not improving or worsens would recommend follow-up with PCP Dr. Raoul Pitch  CMA will administer in office

## 2022-08-05 ENCOUNTER — Ambulatory Visit: Payer: No Typology Code available for payment source | Admitting: Family Medicine

## 2022-08-05 ENCOUNTER — Other Ambulatory Visit (HOSPITAL_BASED_OUTPATIENT_CLINIC_OR_DEPARTMENT_OTHER): Payer: Self-pay

## 2022-08-05 ENCOUNTER — Other Ambulatory Visit: Payer: Self-pay | Admitting: Family Medicine

## 2022-08-05 MED ORDER — PREDNISONE 20 MG PO TABS
ORAL_TABLET | ORAL | 0 refills | Status: DC
  Filled 2022-08-05: qty 18, 9d supply, fill #0

## 2022-08-06 NOTE — Progress Notes (Signed)
Patient was given an injection of Depo-Medrol 40 mg in her left ventrogluteal on 08/02/22. Patient tolerated injection well. Joanette Gula, CMA

## 2022-08-09 ENCOUNTER — Other Ambulatory Visit (HOSPITAL_BASED_OUTPATIENT_CLINIC_OR_DEPARTMENT_OTHER): Payer: Self-pay

## 2022-08-12 ENCOUNTER — Encounter: Payer: Self-pay | Admitting: Family Medicine

## 2022-08-12 ENCOUNTER — Ambulatory Visit (INDEPENDENT_AMBULATORY_CARE_PROVIDER_SITE_OTHER): Payer: No Typology Code available for payment source | Admitting: Family Medicine

## 2022-08-12 VITALS — BP 130/81 | HR 73 | Temp 98.4°F | Ht 64.0 in | Wt 158.2 lb

## 2022-08-12 DIAGNOSIS — R7309 Other abnormal glucose: Secondary | ICD-10-CM | POA: Diagnosis not present

## 2022-08-12 DIAGNOSIS — M545 Low back pain, unspecified: Secondary | ICD-10-CM

## 2022-08-12 LAB — POCT GLYCOSYLATED HEMOGLOBIN (HGB A1C)
HbA1c POC (<> result, manual entry): 5.7 % (ref 4.0–5.6)
HbA1c, POC (controlled diabetic range): 5.7 % (ref 0.0–7.0)
HbA1c, POC (prediabetic range): 5.7 % (ref 5.7–6.4)
Hemoglobin A1C: 5.7 % — AB (ref 4.0–5.6)

## 2022-08-12 NOTE — Patient Instructions (Addendum)
Return in about 28 weeks (around 02/24/2023) for cpe (20 min), Routine chronic condition follow-up.        Great to see you today.  I have refilled the medication(s) we provide.   If labs were collected, we will inform you of lab results once received either by echart message or telephone call.   - echart message- for normal results that have been seen by the patient already.   - telephone call: abnormal results or if patient has not viewed results in their echart.

## 2022-08-12 NOTE — Progress Notes (Signed)
Molly Park , 12-21-1960, 61 y.o., female MRN: [id number] Patient Care Team    Relationship Specialty Notifications Start End  Molly Leatherwood, DO PCP - General Family Medicine  01/14/17   Molly Bolk, MD Consulting Physician Obstetrics and Gynecology  01/14/18   Molly Boop, MD Consulting Physician Gastroenterology  02/21/21     Chief Complaint  Patient presents with   elevated a1c     Subjective: Pt presents for an OV to discuss elevated A1c.  At her physical approximately 6 months ago her A1c was 6.2.  This was a new finding for her.  She states since that time she has been watching her diet and exercising.  She is feeling good and has lost some weight.    02/22/2022    8:08 AM 09/17/2021    9:39 AM 02/21/2021    8:03 AM 02/13/2021    1:30 PM 01/26/2020    8:14 AM  Depression screen PHQ 2/9  Decreased Interest 0 0 0 1 0  Down, Depressed, Hopeless 0 0 0 0 0  PHQ - 2 Score 0 0 0 1 0  Altered sleeping     1  Tired, decreased energy     0  Change in appetite     0  Feeling bad or failure about yourself      0  Trouble concentrating     0  Moving slowly or fidgety/restless     0  Suicidal thoughts     0  PHQ-9 Score     1  Difficult doing work/chores     Not difficult at all    No Known Allergies Social History   Social History Narrative   Patient is married to Molly Park. They have 5 children btw them.   BA degree. 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   Drinks caffeine.   Wears a seatbelt, wears a bicycle helmet, smoke detector in the home.   Firearms in the home.   Exercises routinely.   Feels safe in her relationships.   Past Medical History:  Diagnosis Date   Bilateral edema of lower extremity    tried on low dose lasix by prior provider   Chicken pox    Closed fracture of lower end of right radius with routine healing 08/18/2019   Dysphagia 07/22/2018   Hemorrhoid    History of COVID-19 02/15/2021   HSV infection     Hypothyroidism    PONV (postoperative nausea and vomiting)    Rotator cuff impingement syndrome of right shoulder    Past Surgical History:  Procedure Laterality Date   APPENDECTOMY  1973   BREAST EXCISIONAL BIOPSY Right 2014   EXAM UNDER ANESTHESIA WITH MANIPULATION OF SHOULDER Right 01/17/2020   Procedure: Right shoulder manipulation under anesthesia;  Surgeon: Molly Broom, MD;  Location: [facility];  Service: Orthopedics;  Laterality: Right;   HEMORRHOID SURGERY     OPEN REDUCTION INTERNAL FIXATION (ORIF) DISTAL RADIAL FRACTURE Right 08/12/2019   Procedure: OPEN REDUCTION INTERNAL FIXATION (ORIF) DISTAL RADIAL FRACTURE;  Surgeon: Molly Loa, MD;  Location: [facility];  Service: Orthopedics;  Laterality: Right;   SHOULDER INJECTION Right 01/17/2020   Procedure: SHOULDER STEROID INJECTION;  Surgeon: Molly Broom, MD;  Location: [facility];  Service: Orthopedics;  Laterality: Right;   TUBAL LIGATION  2004   Family History  Problem Relation Age of Onset   Diabetes Mother  Valvular heart disease Father    Breast cancer Maternal Aunt        50   Allergies as of 08/12/2022   No Known Allergies      Medication List        Accurate as of August 12, 2022  9:35 AM. If you have any questions, ask your nurse or doctor.          STOP taking these medications    predniSONE 20 MG tablet Commonly known as: DELTASONE Stopped by: Molly Pacini, DO       TAKE these medications    atorvastatin 20 MG tablet Commonly known as: LIPITOR Take 1 tablet (20 mg total) by mouth daily.   Diclofenac Sodium CR 100 MG 24 hr tablet TAKE 1 TABLET (100 MG TOTAL) BY MOUTH DAILY.   gabapentin 300 MG capsule Commonly known as: NEURONTIN TAKE 1 CAPSULE (300 MG TOTAL) BY MOUTH THREE TIMES DAILY AS NEEDED FOR NERVE PAIN.   omeprazole 40 MG capsule Commonly known as: PRILOSEC Take 1 capsule (40 mg total) by mouth daily.   SM Lorata-dine D  10-240 MG 24 hr tablet Generic drug: loratadine-pseudoephedrine Take 1 tablet by mouth daily.   Synthroid 100 MCG tablet Generic drug: levothyroxine TAKE 1 TABLET (100 MCG TOTAL) BY MOUTH DAILY BEFORE BREAKFAST.   triamcinolone cream 0.1 % Commonly known as: KENALOG Apply 1 Application topically 2 (two) times daily.   triamcinolone cream 0.1 % Commonly known as: KENALOG Apply 1 Application topically 2 (two) times daily.   venlafaxine XR 150 MG 24 hr capsule Commonly known as: Effexor XR Take 1 capsule (150 mg total) by mouth daily with breakfast.   Vitamin D 50 MCG (2000 UT) tablet Take 2,000 Units by mouth daily.        All past medical history, surgical history, allergies, family history, immunizations andmedications were updated in the EMR today and reviewed under the history and medication portions of their EMR.     ROS Negative, with the exception of above mentioned in HPI   Objective:  BP 130/81   Pulse 73   Temp 98.4 F (36.9 C)   Ht 5\' 4"  (1.626 m)   Wt 158 lb 3.2 oz (71.8 kg)   LMP  (LMP Unknown)   SpO2 100%   BMI 27.15 kg/m  Body mass index is 27.15 kg/m. Physical Exam Vitals and nursing note reviewed.  Constitutional:      General: She is not in acute distress.    Appearance: Normal appearance. She is normal weight. She is not ill-appearing or toxic-appearing.  Eyes:     Extraocular Movements: Extraocular movements intact.     Conjunctiva/sclera: Conjunctivae normal.     Pupils: Pupils are equal, round, and reactive to light.  Cardiovascular:     Rate and Rhythm: Normal rate and regular rhythm.  Neurological:     Mental Status: She is alert and oriented to person, place, and time. Mental status is at baseline.  Psychiatric:        Mood and Affect: Mood normal.        Behavior: Behavior normal.        Thought Content: Thought content normal.        Judgment: Judgment normal.     No results found. No results found. Results for orders  placed or performed in visit on 08/12/22 (from the past 24 hour(s))  POCT HgB A1C     Status: Abnormal   Collection Time: 08/12/22  8:36 AM  Result Value Ref Range   Hemoglobin A1C 5.7 (A) 4.0 - 5.6 %   HbA1c POC (<> result, manual entry) 5.7 4.0 - 5.6 %   HbA1c, POC (prediabetic range) 5.7 5.7 - 6.4 %   HbA1c, POC (controlled diabetic range) 5.7 0.0 - 7.0 %    Assessment/Plan: Pearson Reasons is a 61 y.o. female present for OV for  Elevated hemoglobin A1c Her A1c dropped from 6.2, now 5.7.  Congratulated her. Keep up the good work with dietary modifications and exercise. - POCT HgB A1C Follow-up in April for her physical [facility] appointment  Reviewed expectations re: course of current medical issues. Discussed self-management of symptoms. Outlined signs and symptoms indicating need for more acute intervention. Patient verbalized understanding and all questions were answered. Patient received an After-Visit Summary.    Orders Placed This Encounter  Procedures   POCT HgB A1C   No orders of the defined types were placed in this encounter.  Referral Orders  No referral(s) requested today     Note is dictated utilizing voice recognition software. Although note has been proof read prior to signing, occasional typographical errors still can be missed. If any questions arise, please do not hesitate to call for verification.   electronically signed by:  Howard Pouch, DO  [place]

## 2022-08-22 ENCOUNTER — Other Ambulatory Visit (HOSPITAL_BASED_OUTPATIENT_CLINIC_OR_DEPARTMENT_OTHER): Payer: Self-pay

## 2022-08-28 IMAGING — MG MM DIGITAL SCREENING BILAT W/ TOMO AND CAD
8 series · 8 of 24 positions shown · non-contrast
Comparison: Previous exam(s).

CLINICAL DATA: Screening.

EXAM:
DIGITAL SCREENING BILATERAL MAMMOGRAM WITH TOMOSYNTHESIS AND CAD
TECHNIQUE: Bilateral screening digital craniocaudal and mediolateral oblique
mammograms were obtained. Bilateral screening digital breast
tomosynthesis was performed. The images were evaluated with
computer-aided detection.

[R MLO synth-2D]
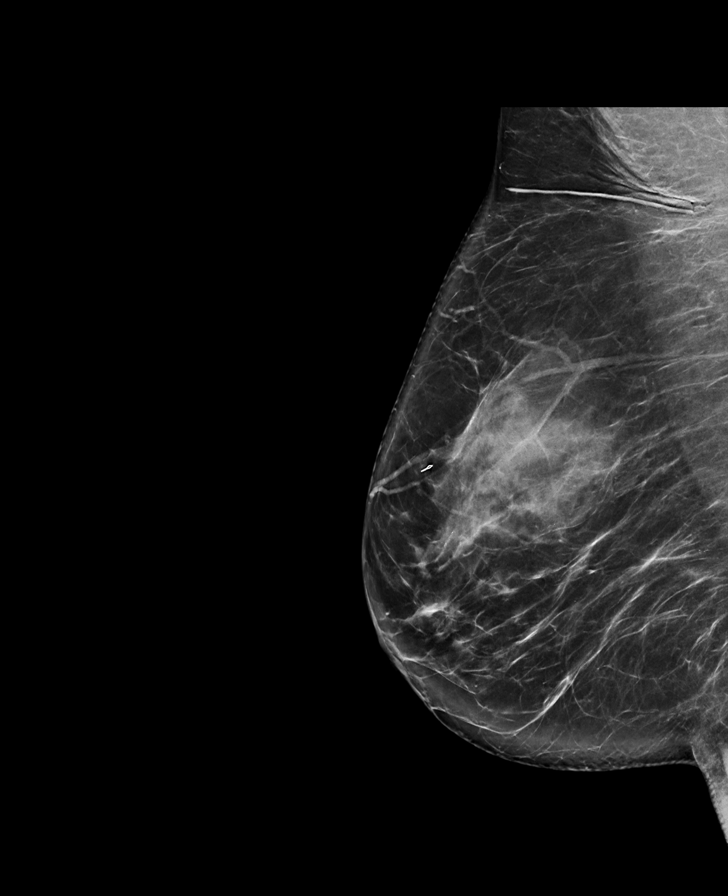

[R CC synth-2D]
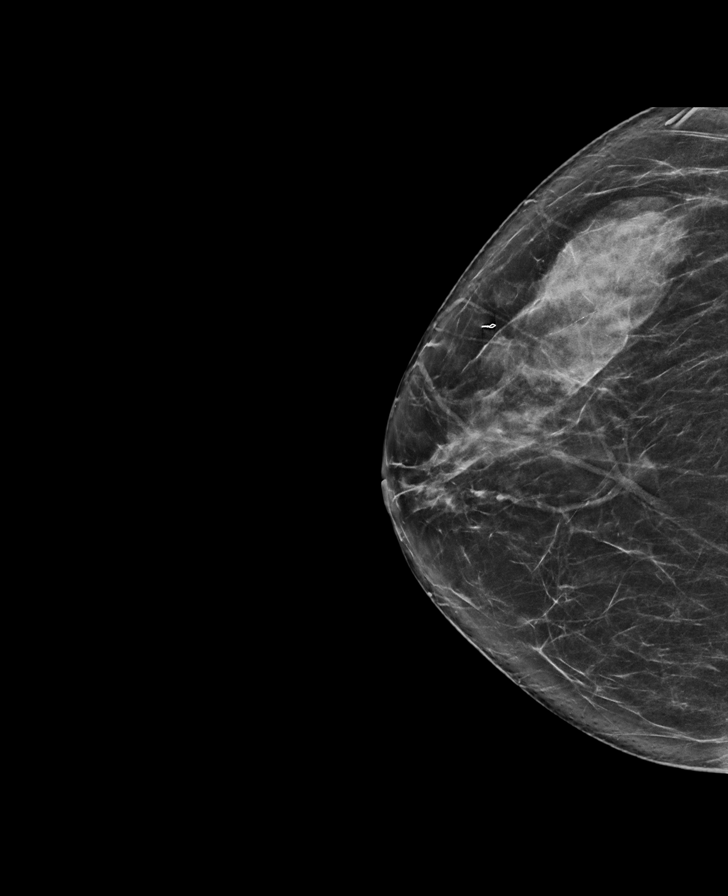

[L CC synth-2D]
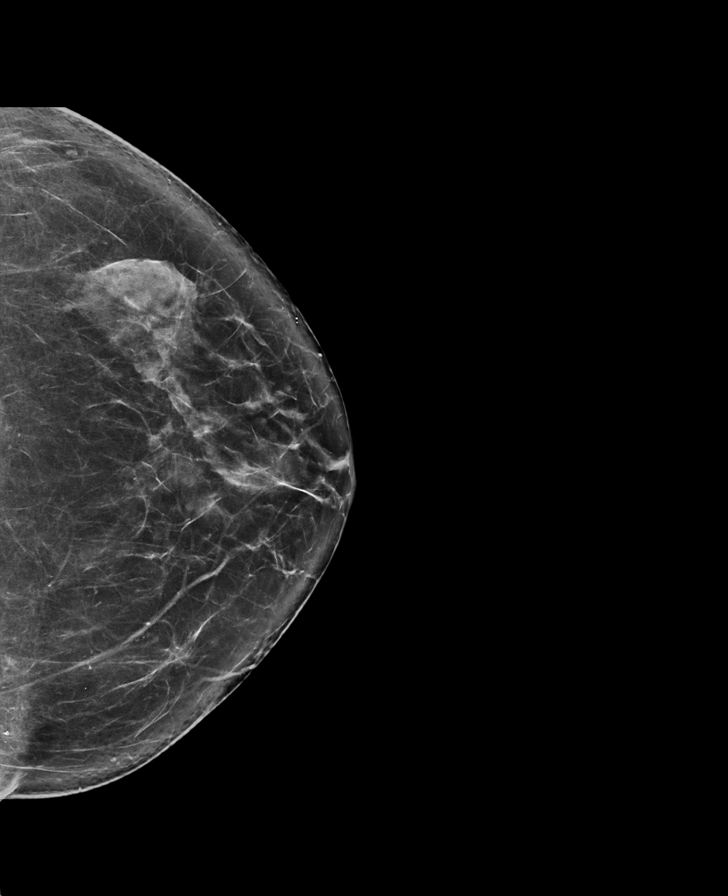

[L MLO synth-2D]
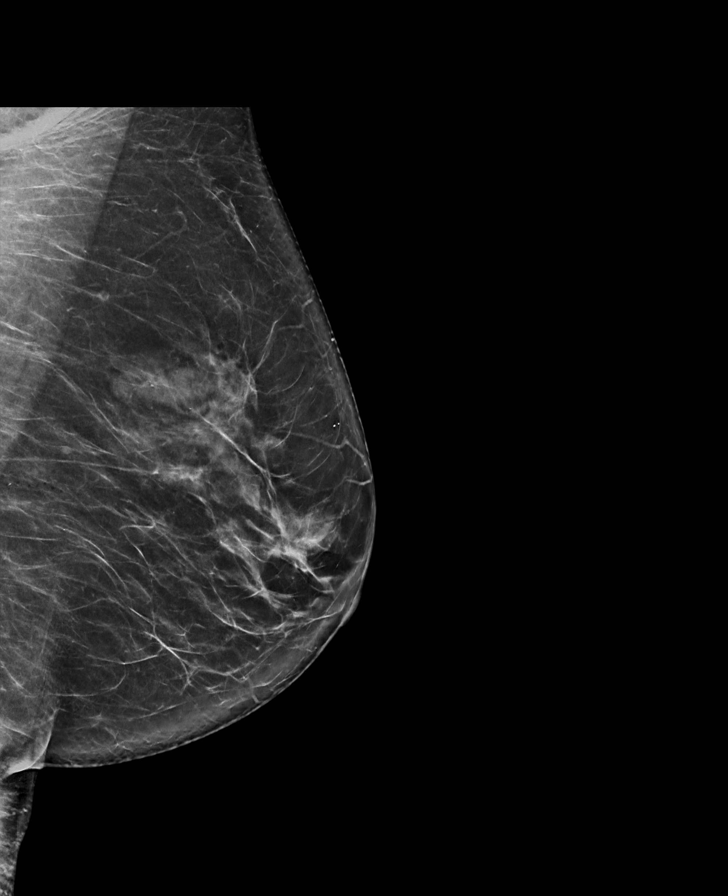

[R MLO tomo · tomo slice 43/85.0]
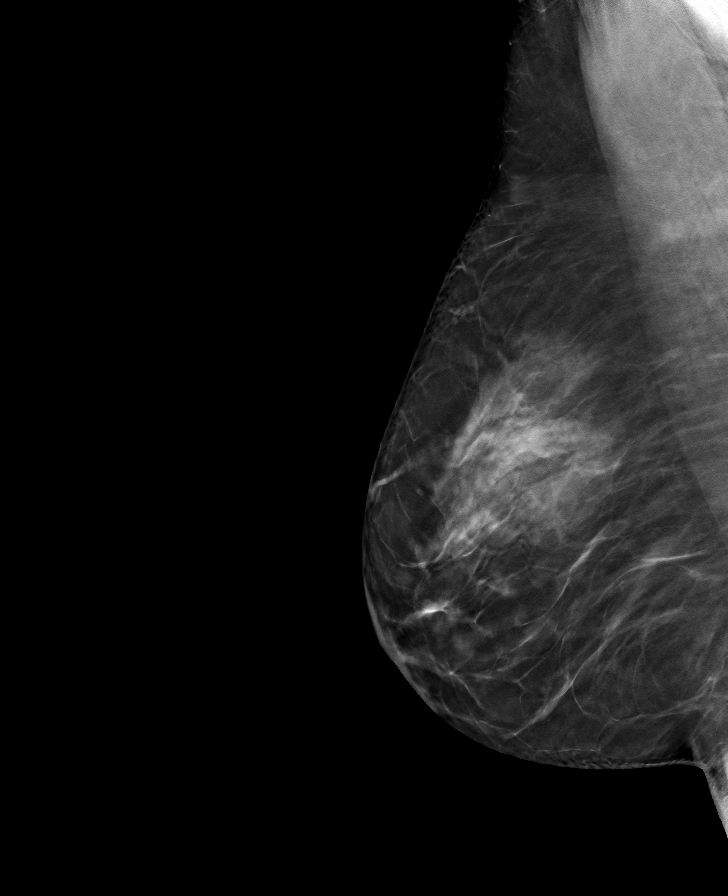

[L MLO tomo · tomo slice 42/83.0]
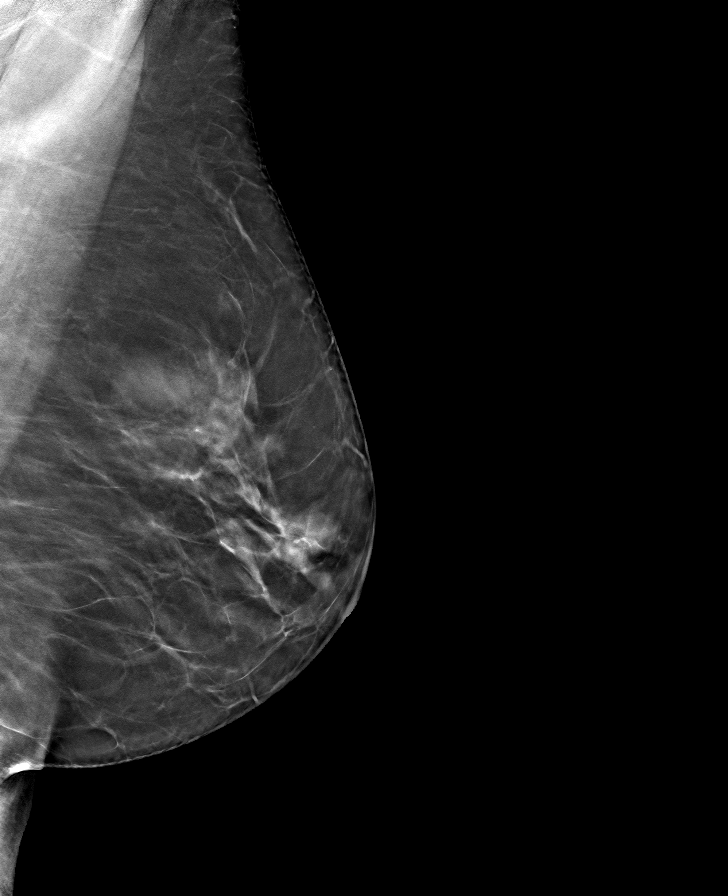

[L CC tomo · tomo slice 42/83.0]
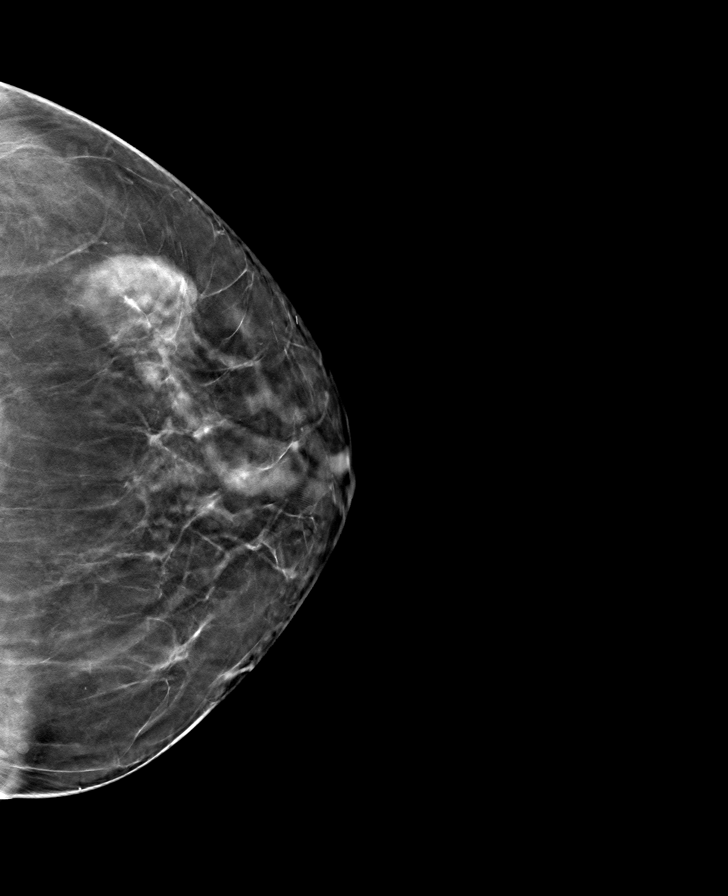

[R CC tomo · tomo slice 37/73.0]
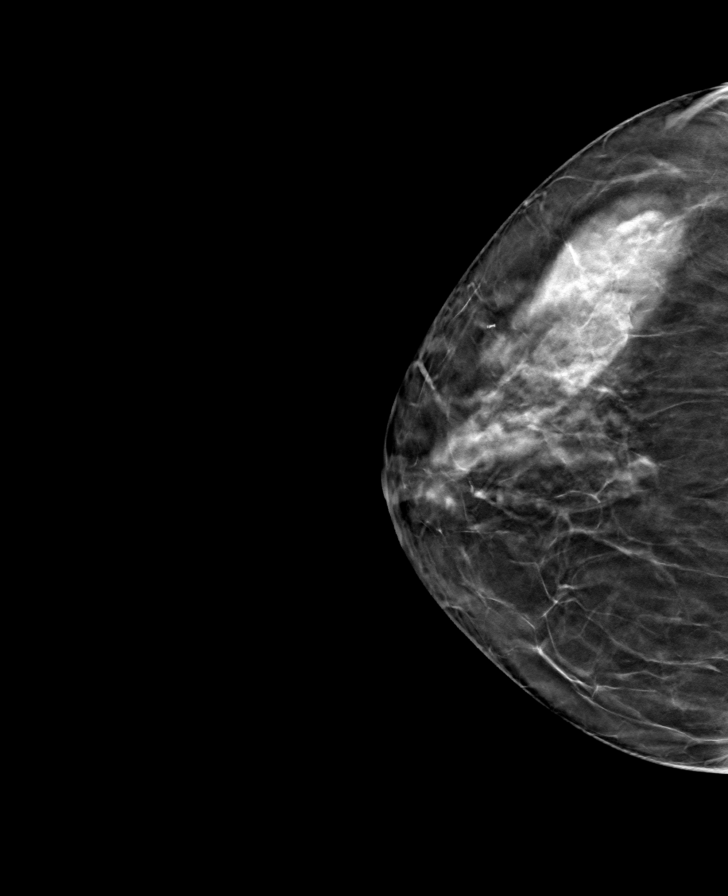

[8 of 24 positions shown; findings below may reference images not displayed]

ACR Breast Density Category b: There are scattered areas of
fibroglandular density.
FINDINGS: There are no findings suspicious for malignancy.
IMPRESSION: No mammographic evidence of malignancy. A result letter of this
screening mammogram will be mailed directly to the patient.

RECOMMENDATION:
Screening mammogram in one year. (Code:51-O-LD2)

BI-RADS CATEGORY  1: Negative.

## 2022-09-03 NOTE — Progress Notes (Signed)
61 y.o. [id number] Legally Separated White or Caucasian Hispanic or Latino female here for annual exam.   She reports random brown discharge over the last year. Happens a couple of times a month, sometimes after sex, sometimes not.  She and her long term boyfriend split for a time, back together and happy. No dyspareunia.   She has some hesitancy to void, has seen Urology and was given exercises to do. Stable.     She is on Effexor for vasomotor symptoms, managed by her primary.   No LMP recorded (lmp unknown). Patient is postmenopausal.          Sexually active: Yes.    The current method of family planning is post menopausal status.    Exercising: Yes.    Gym/ health club routine includes cardio and low impact aerobics. Smoker:  no  Health Maintenance: Pap:   12/25/17 WNL HR HPV Neg 2016 WNL per patient  History of abnormal Pap:  yes yes had colpo- neg  MMG:  03/11/22 density B Bi-rads 1 neg  BMD:   n/a Colonoscopy:2015 polyp, she is due for another one.  TDaP:  01/14/17 Gardasil: n/a   reports that she has never smoked. She has never used smokeless tobacco. She reports current alcohol use of about 1.0 standard drink of alcohol per week. She reports that she does not use drugs. She is a Engineer, building services for [employer]. 3 kids, 5 grand children. 2 step children.   Past Medical History:  Diagnosis Date   Bilateral edema of lower extremity    tried on low dose lasix by prior provider   Chicken pox    Closed fracture of lower end of right radius with routine healing 08/18/2019   Dysphagia 07/22/2018   Hemorrhoid    History of COVID-19 02/15/2021   HSV infection    Hypothyroidism    PONV (postoperative nausea and vomiting)    Rotator cuff impingement syndrome of right shoulder     Past Surgical History:  Procedure Laterality Date   APPENDECTOMY  1973   BREAST EXCISIONAL BIOPSY Right 2014   EXAM UNDER ANESTHESIA WITH MANIPULATION OF SHOULDER Right 01/17/2020   Procedure: Right  shoulder manipulation under anesthesia;  Surgeon: Tania Ade, MD;  Location: [facility];  Service: Orthopedics;  Laterality: Right;   HEMORRHOID SURGERY     OPEN REDUCTION INTERNAL FIXATION (ORIF) DISTAL RADIAL FRACTURE Right 08/12/2019   Procedure: OPEN REDUCTION INTERNAL FIXATION (ORIF) DISTAL RADIAL FRACTURE;  Surgeon: Leanora Cover, MD;  Location: [facility];  Service: Orthopedics;  Laterality: Right;   SHOULDER INJECTION Right 01/17/2020   Procedure: SHOULDER STEROID INJECTION;  Surgeon: Tania Ade, MD;  Location: [facility];  Service: Orthopedics;  Laterality: Right;   TUBAL LIGATION  2004    Current Outpatient Medications  Medication Sig Dispense Refill   atorvastatin (LIPITOR) 20 MG tablet Take 1 tablet (20 mg total) by mouth daily. 90 tablet 4   Cholecalciferol (VITAMIN D) 50 MCG (2000 UT) tablet Take 2,000 Units by mouth daily.     Diclofenac Sodium CR 100 MG 24 hr tablet TAKE 1 TABLET (100 MG TOTAL) BY MOUTH DAILY. 90 tablet 3   gabapentin (NEURONTIN) 300 MG capsule TAKE 1 CAPSULE (300 MG TOTAL) BY MOUTH THREE TIMES DAILY AS NEEDED FOR NERVE PAIN. 90 capsule 3   loratadine-pseudoephedrine (SM LORATA-DINE D) 10-240 MG 24 hr tablet Take 1 tablet by mouth daily. 30 tablet 2   omeprazole (PRILOSEC) 40 MG capsule  Take 1 capsule (40 mg total) by mouth daily. 90 capsule 3   SYNTHROID 100 MCG tablet TAKE 1 TABLET (100 MCG TOTAL) BY MOUTH DAILY BEFORE BREAKFAST. 90 tablet 1   triamcinolone cream (KENALOG) 0.1 % Apply 1 Application topically 2 (two) times daily.     triamcinolone cream (KENALOG) 0.1 % Apply 1 Application topically 2 (two) times daily. 30 g 0   venlafaxine XR (EFFEXOR XR) 150 MG 24 hr capsule Take 1 capsule (150 mg total) by mouth daily with breakfast. 90 capsule 3   No current facility-administered medications for this visit.    Family History  Problem Relation Age of Onset   Diabetes Mother    Valvular heart disease  Father    Breast cancer Maternal Aunt        50    Review of Systems  All other systems reviewed and are negative.   Exam:   LMP  (LMP Unknown)   Weight change: @WEIGHTCHANGE @ Height:      Ht Readings from Last 3 Encounters:  08/12/22 5\' 4"  (1.626 m)  06/17/22 5\' 4"  (1.626 m)  06/06/22 5\' 4"  (1.626 m)    General appearance: alert, cooperative and appears stated age Head: Normocephalic, without obvious abnormality, atraumatic Neck: no adenopathy, supple, symmetrical, trachea midline and thyroid normal to inspection and palpation Lungs: clear to auscultation bilaterally Cardiovascular: regular rate and rhythm Breasts: normal appearance, no masses or tenderness Abdomen: soft, non-tender; non distended,  no masses,  no organomegaly Extremities: extremities normal, atraumatic, no cyanosis or edema Skin: Skin color, texture, turgor normal. No rashes or lesions Lymph nodes: Cervical, supraclavicular, and axillary nodes normal. No abnormal inguinal nodes palpated Neurologic: Grossly normal   Pelvic: External genitalia:  no lesions              Urethra:  normal appearing urethra with no masses, tenderness or lesions              Bartholins and Skenes: normal                 Vagina: normal appearing vagina with normal color and discharge, no lesions              Cervix: nabothian cyst               Bimanual Exam:  Uterus:  normal size, contour, position, consistency, mobility, non-tender              Adnexa: no mass, fullness, tenderness               Rectovaginal: Confirms               Anus:  normal sphincter tone, no lesions  08/17/22, CMA chaperoned for the exam.  1. Well woman exam Discussed breast self exam Discussed calcium and vit D intake Mammogram and labs UTD  2. Screening for cervical cancer - Cytology - PAP  3. Postmenopausal bleeding - PELVIS TRANSVAGINAL NON-OB (TV ONLY); Future  4. Colon cancer screening - Ambulatory referral to  Gastroenterology

## 2022-09-11 ENCOUNTER — Other Ambulatory Visit (HOSPITAL_COMMUNITY)
Admission: RE | Admit: 2022-09-11 | Discharge: 2022-09-11 | Disposition: A | Discharge status: Home / Self Care | Payer: No Typology Code available for payment source | Source: Ambulatory Visit | Attending: Obstetrics and Gynecology | Admitting: Obstetrics and Gynecology

## 2022-09-11 ENCOUNTER — Encounter: Payer: Self-pay | Admitting: Obstetrics and Gynecology

## 2022-09-11 ENCOUNTER — Ambulatory Visit (INDEPENDENT_AMBULATORY_CARE_PROVIDER_SITE_OTHER): Payer: No Typology Code available for payment source | Admitting: Obstetrics and Gynecology

## 2022-09-11 VITALS — BP 122/64 | HR 75 | Ht 64.5 in | Wt 157.0 lb

## 2022-09-11 DIAGNOSIS — Z1211 Encounter for screening for malignant neoplasm of colon: Secondary | ICD-10-CM

## 2022-09-11 DIAGNOSIS — N950 Postmenopausal bleeding: Secondary | ICD-10-CM

## 2022-09-11 DIAGNOSIS — Z124 Encounter for screening for malignant neoplasm of cervix: Secondary | ICD-10-CM | POA: Insufficient documentation

## 2022-09-11 DIAGNOSIS — Z01419 Encounter for gynecological examination (general) (routine) without abnormal findings: Secondary | ICD-10-CM | POA: Diagnosis not present

## 2022-09-11 NOTE — Patient Instructions (Signed)

## 2022-09-12 ENCOUNTER — Other Ambulatory Visit (HOSPITAL_BASED_OUTPATIENT_CLINIC_OR_DEPARTMENT_OTHER): Payer: Self-pay

## 2022-09-13 LAB — CYTOLOGY - PAP
Comment: NEGATIVE
High risk HPV: NEGATIVE

## 2022-09-17 IMAGING — DX DG FOOT COMPLETE 3+V*R*
3 series · 3 of 3 positions shown · non-contrast
Comparison: None Available.

CLINICAL DATA: Right foot pain at distal 1st metatarsal. Hallux
limitus, right. Toe pain.

EXAM:
RIGHT FOOT COMPLETE - 3+ VIEW

[foot ap wb]
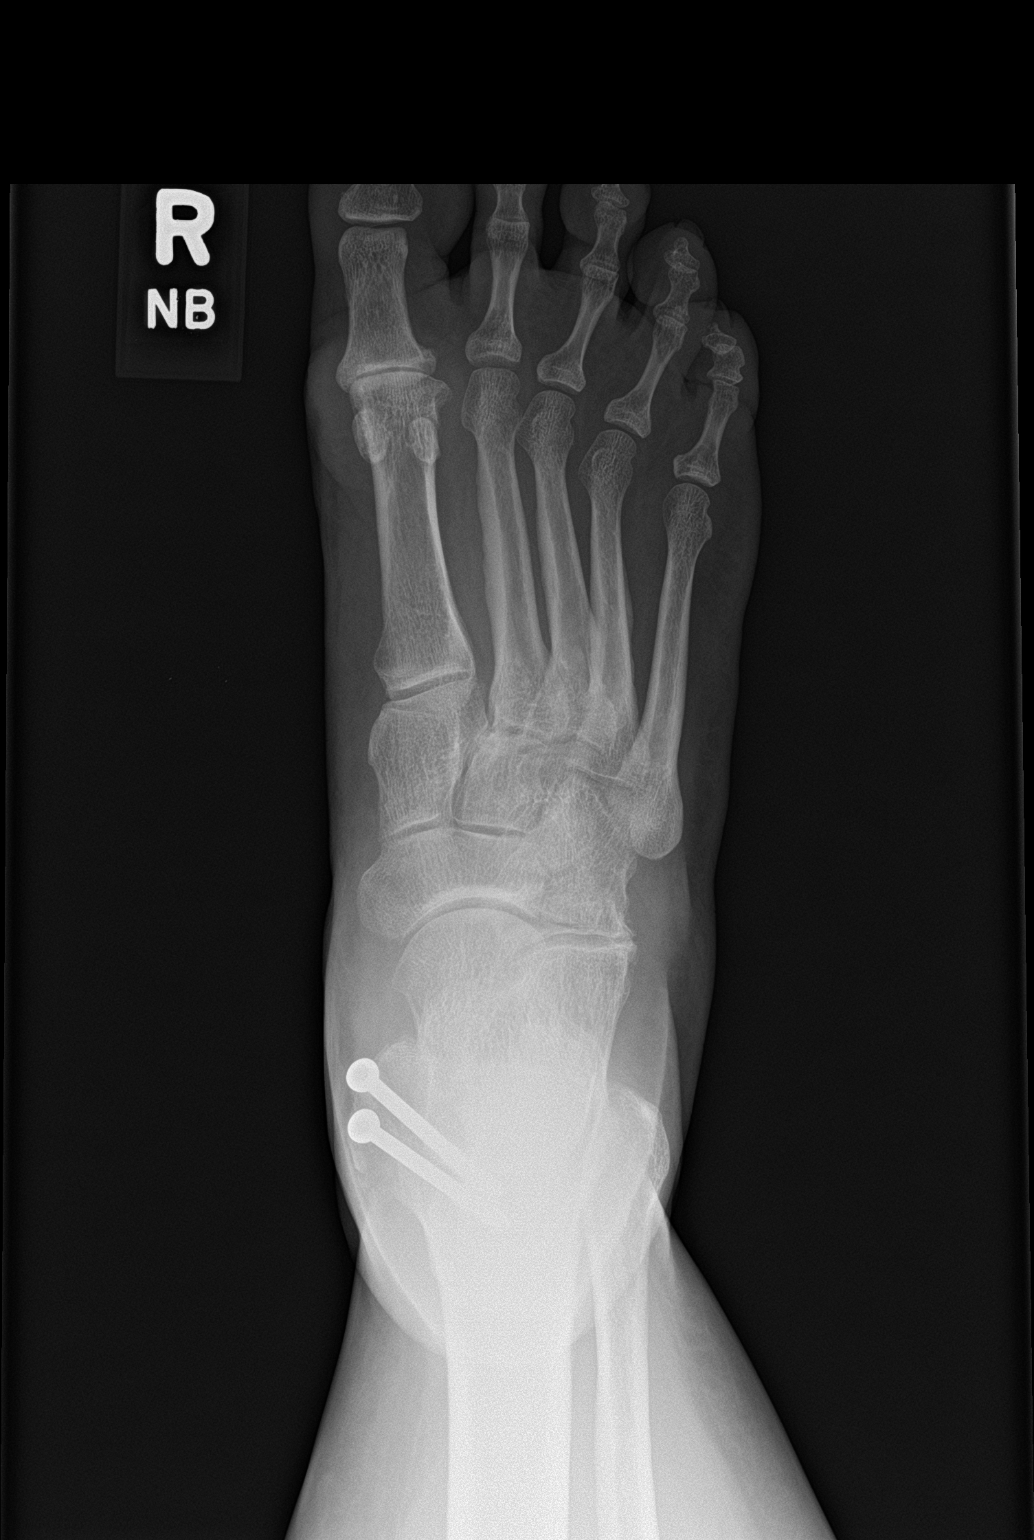

[foot obl wb]
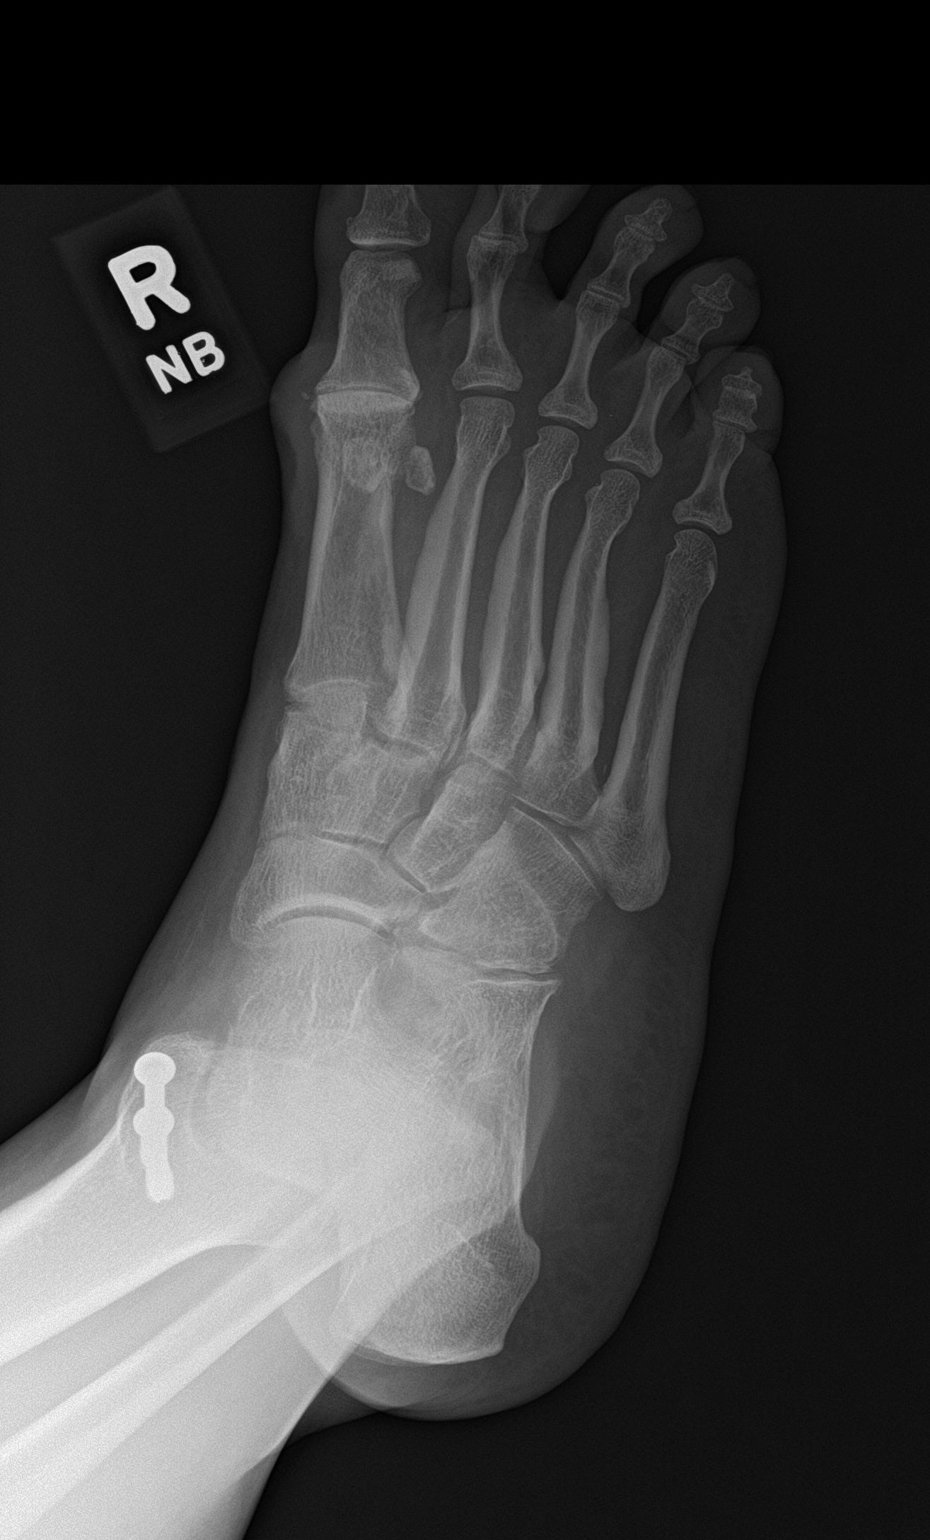

[foot lat wb]
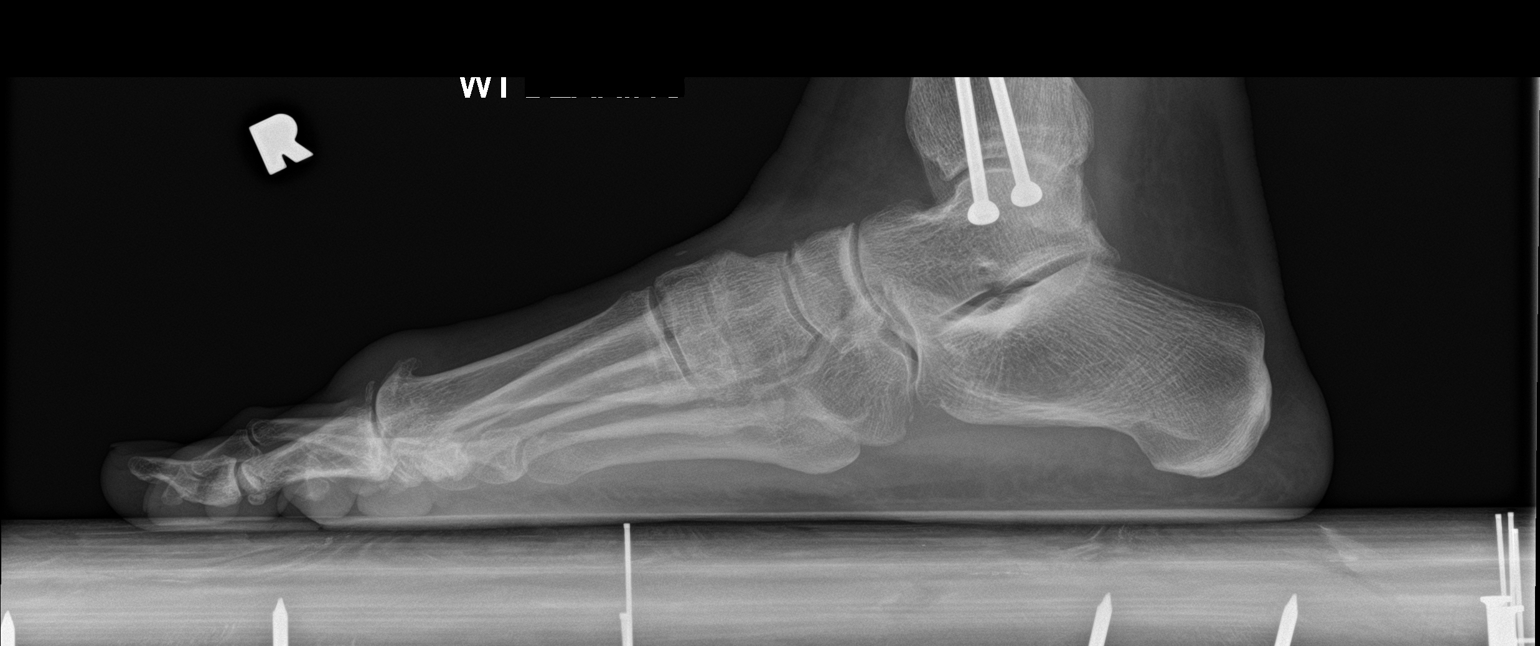

[3 of 3 positions shown; findings below may reference images not displayed]

FINDINGS: Imaging obtained weight-bearing. Joint space narrowing and spurring
at the first metatarsal phalangeal joint. There is soft tissue
thickening overlying the dorsal aspect of the joint. No soft tissue
calcifications. There is slight midfoot dorsal spurring. No erosion
or periosteal reaction. No fracture. No focal bone lesion or bone
destruction. Two screws traverse the medial malleolus.
IMPRESSION: 1. Mild osteoarthritis of the first metatarsophalangeal joint. Soft
tissue thickening overlying the dorsal aspect of the joint.
2. Mild midfoot osteoarthritis.

## 2022-09-26 ENCOUNTER — Other Ambulatory Visit (HOSPITAL_BASED_OUTPATIENT_CLINIC_OR_DEPARTMENT_OTHER): Payer: Self-pay

## 2022-10-10 ENCOUNTER — Encounter: Payer: Self-pay | Admitting: Obstetrics and Gynecology

## 2022-10-10 ENCOUNTER — Ambulatory Visit: Payer: No Typology Code available for payment source

## 2022-10-10 ENCOUNTER — Ambulatory Visit (INDEPENDENT_AMBULATORY_CARE_PROVIDER_SITE_OTHER): Payer: No Typology Code available for payment source | Admitting: Obstetrics and Gynecology

## 2022-10-10 VITALS — BP 120/80 | HR 78 | Ht 64.0 in | Wt 157.0 lb

## 2022-10-10 DIAGNOSIS — N950 Postmenopausal bleeding: Secondary | ICD-10-CM | POA: Diagnosis not present

## 2022-10-10 NOTE — Progress Notes (Signed)
GYNECOLOGY  VISIT   HPI: 61 y.o.   Legally Separated White or Caucasian Hispanic or Latino  female   [phone] with No LMP recorded (lmp unknown). Patient is postmenopausal.   here for evaluation of brown vaginal discharge.   GYNECOLOGIC HISTORY: No LMP recorded (lmp unknown). Patient is postmenopausal. Contraception:PMP Menopausal hormone therapy: none        OB History     Gravida  5   Para  3   Term  2   Preterm  1   AB  2   Living  3      SAB  2   IAB      Ectopic      Multiple      Live Births  3              Patient Active Problem List   Diagnosis Date Noted   GERD (gastroesophageal reflux disease) 02/22/2022   On statin therapy 02/22/2022   Overweight (BMI 25.0-29.9) 02/22/2022   Sprain of sacroiliac ligament 10/22/2021   Toe pain, right 09/17/2021   Arthritis 09/17/2021   Mixed hyperlipidemia 02/21/2021   Nonallopathic lesion of cervical region 08/24/2020   Hot flashes 07/22/2018   Elevated hemoglobin A1c 07/22/2018   Lumbar pain 01/14/2018   Encounter for preventive health examination 01/14/2017   Hypothyroidism 01/14/2017   Vitamin D deficiency 01/14/2017    Past Medical History:  Diagnosis Date   Bilateral edema of lower extremity    tried on low dose lasix by prior provider   Chicken pox    Closed fracture of lower end of right radius with routine healing 08/18/2019   Dysphagia 07/22/2018   Hemorrhoid    History of COVID-19 02/15/2021   HSV infection    Hypothyroidism    PONV (postoperative nausea and vomiting)    Rotator cuff impingement syndrome of right shoulder     Past Surgical History:  Procedure Laterality Date   APPENDECTOMY  1973   BREAST EXCISIONAL BIOPSY Right 2014   EXAM UNDER ANESTHESIA WITH MANIPULATION OF SHOULDER Right 01/17/2020   Procedure: Right shoulder manipulation under anesthesia;  Surgeon: Jones Broom, MD;  Location: [facility];  Service: Orthopedics;  Laterality: Right;   HEMORRHOID  SURGERY     OPEN REDUCTION INTERNAL FIXATION (ORIF) DISTAL RADIAL FRACTURE Right 08/12/2019   Procedure: OPEN REDUCTION INTERNAL FIXATION (ORIF) DISTAL RADIAL FRACTURE;  Surgeon: Betha Loa, MD;  Location: [facility];  Service: Orthopedics;  Laterality: Right;   SHOULDER INJECTION Right 01/17/2020   Procedure: SHOULDER STEROID INJECTION;  Surgeon: Jones Broom, MD;  Location: [facility];  Service: Orthopedics;  Laterality: Right;   TUBAL LIGATION  2004    Current Outpatient Medications  Medication Sig Dispense Refill   atorvastatin (LIPITOR) 20 MG tablet Take 1 tablet (20 mg total) by mouth daily. 90 tablet 4   Cholecalciferol (VITAMIN D) 50 MCG (2000 UT) tablet Take 2,000 Units by mouth daily.     Diclofenac Sodium CR 100 MG 24 hr tablet TAKE 1 TABLET (100 MG TOTAL) BY MOUTH DAILY. 90 tablet 3   loratadine-pseudoephedrine (SM LORATA-DINE D) 10-240 MG 24 hr tablet Take 1 tablet by mouth daily. 30 tablet 2   omeprazole (PRILOSEC) 40 MG capsule Take 1 capsule (40 mg total) by mouth daily. 90 capsule 3   SYNTHROID 100 MCG tablet TAKE 1 TABLET (100 MCG TOTAL) BY MOUTH DAILY BEFORE BREAKFAST. 90 tablet 1   triamcinolone cream (KENALOG) 0.1 % Apply  1 Application topically 2 (two) times daily.     triamcinolone cream (KENALOG) 0.1 % Apply 1 Application topically 2 (two) times daily. 30 g 0   venlafaxine XR (EFFEXOR XR) 150 MG 24 hr capsule Take 1 capsule (150 mg total) by mouth daily with breakfast. 90 capsule 3   gabapentin (NEURONTIN) 300 MG capsule TAKE 1 CAPSULE (300 MG TOTAL) BY MOUTH THREE TIMES DAILY AS NEEDED FOR NERVE PAIN. 90 capsule 3   No current facility-administered medications for this visit.     ALLERGIES: Patient has no known allergies.  Family History  Problem Relation Age of Onset   Diabetes Mother    Valvular heart disease Father    Breast cancer Maternal Aunt        2    Social History   Socioeconomic History   Marital status:  Legally Separated    Spouse name: Thereasa Distance   Number of children: 5   Years of education: BA   Highest education level: Not on file  Occupational History   Occupation: Merchant navy officer  Tobacco Use   Smoking status: Never   Smokeless tobacco: Never  Vaping Use   Vaping Use: Never used  Substance and Sexual Activity   Alcohol use: Yes    Alcohol/week: 1.0 standard drink of alcohol    Types: 1 Glasses of wine per week    Comment: social   Drug use: No   Sexual activity: Yes    Partners: Male    Birth control/protection: Surgical  Other Topics Concern   Not on file  Social History Narrative   Patient is married to [place]. They have 5 children btw them.   BA degree. 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   Drinks caffeine.   Wears a seatbelt, wears a bicycle helmet, smoke detector in the home.   Firearms in the home.   Exercises routinely.   Feels safe in her relationships.   Social Determinants of Health   Financial Resource Strain: Not on file  Food Insecurity: Not on file  Transportation Needs: Not on file  Physical Activity: Not on file  Stress: Not on file  Social Connections: Not on file  Intimate Partner Violence: Not on file    ROS  PHYSICAL EXAMINATION:    BP 120/80 (BP Location: Right Arm, Patient Position: Sitting, Cuff Size: Normal)   Pulse 78   Ht 5\' 4"  (1.626 m)   Wt 157 lb (71.2 kg)   LMP  (LMP Unknown)   BMI 26.95 kg/m     General appearance: alert, cooperative and appears stated age  U/S with uniform thin endometrium, avascular (see report).  1. Postmenopausal bleeding The patient reports intermittent brown d/c. U/S is reassuring.  Recommended she come in while having the d/c to try and determine if it is truly blood -If she is having persistent d/c and it is blood, I would recommend a sonohysterogram and possible endometrial biopsy

## 2022-10-12 ENCOUNTER — Other Ambulatory Visit: Payer: Self-pay | Admitting: Family Medicine

## 2022-10-12 MED ORDER — CIPROFLOXACIN HCL 0.3 % OP SOLN
2.0000 [drp] | OPHTHALMIC | 0 refills | Status: DC
Start: 2022-10-12 — End: 2023-02-25

## 2022-10-17 ENCOUNTER — Encounter: Payer: Self-pay | Admitting: Obstetrics and Gynecology

## 2022-10-24 ENCOUNTER — Encounter: Payer: Self-pay | Admitting: *Deleted

## 2022-10-28 ENCOUNTER — Other Ambulatory Visit (HOSPITAL_BASED_OUTPATIENT_CLINIC_OR_DEPARTMENT_OTHER): Payer: Self-pay

## 2022-10-28 ENCOUNTER — Other Ambulatory Visit: Payer: Self-pay | Admitting: Family Medicine

## 2022-10-28 DIAGNOSIS — E039 Hypothyroidism, unspecified: Secondary | ICD-10-CM

## 2022-10-28 MED ORDER — GABAPENTIN 300 MG PO CAPS
300.0000 mg | ORAL_CAPSULE | Freq: Three times a day (TID) | ORAL | 3 refills | Status: DC | PRN
  Filled 2022-10-28 – 2022-11-07 (×2): qty 90, 30d supply, fill #0

## 2022-10-28 MED ORDER — SYNTHROID 100 MCG PO TABS
100.0000 ug | ORAL_TABLET | Freq: Every day | ORAL | 0 refills | Status: DC
  Filled 2022-10-28 – 2022-11-07 (×2): qty 30, 30d supply, fill #0

## 2022-10-28 NOTE — Telephone Encounter (Signed)
Rx refill request approved per Dr. Corey's orders. 

## 2022-11-05 ENCOUNTER — Other Ambulatory Visit (HOSPITAL_BASED_OUTPATIENT_CLINIC_OR_DEPARTMENT_OTHER): Payer: Self-pay

## 2022-11-07 ENCOUNTER — Other Ambulatory Visit (HOSPITAL_BASED_OUTPATIENT_CLINIC_OR_DEPARTMENT_OTHER): Payer: Self-pay

## 2022-12-09 ENCOUNTER — Other Ambulatory Visit: Payer: Self-pay | Admitting: Family Medicine

## 2022-12-09 ENCOUNTER — Other Ambulatory Visit (HOSPITAL_BASED_OUTPATIENT_CLINIC_OR_DEPARTMENT_OTHER): Payer: Self-pay

## 2022-12-09 DIAGNOSIS — E039 Hypothyroidism, unspecified: Secondary | ICD-10-CM

## 2022-12-10 ENCOUNTER — Other Ambulatory Visit (HOSPITAL_BASED_OUTPATIENT_CLINIC_OR_DEPARTMENT_OTHER): Payer: Self-pay

## 2022-12-10 ENCOUNTER — Encounter (HOSPITAL_BASED_OUTPATIENT_CLINIC_OR_DEPARTMENT_OTHER): Payer: Self-pay | Admitting: Pharmacist

## 2022-12-11 ENCOUNTER — Other Ambulatory Visit: Payer: Self-pay

## 2022-12-11 ENCOUNTER — Other Ambulatory Visit (HOSPITAL_BASED_OUTPATIENT_CLINIC_OR_DEPARTMENT_OTHER): Payer: Self-pay

## 2022-12-11 ENCOUNTER — Other Ambulatory Visit: Payer: Self-pay | Admitting: Family Medicine

## 2022-12-11 ENCOUNTER — Encounter: Payer: Self-pay | Admitting: Family Medicine

## 2022-12-11 ENCOUNTER — Encounter (HOSPITAL_BASED_OUTPATIENT_CLINIC_OR_DEPARTMENT_OTHER): Payer: Self-pay | Admitting: Pharmacist

## 2022-12-11 DIAGNOSIS — E039 Hypothyroidism, unspecified: Secondary | ICD-10-CM

## 2022-12-12 ENCOUNTER — Other Ambulatory Visit (HOSPITAL_COMMUNITY): Payer: Self-pay

## 2022-12-12 ENCOUNTER — Other Ambulatory Visit (HOSPITAL_BASED_OUTPATIENT_CLINIC_OR_DEPARTMENT_OTHER): Payer: Self-pay

## 2022-12-12 ENCOUNTER — Other Ambulatory Visit: Payer: Self-pay

## 2022-12-12 DIAGNOSIS — E039 Hypothyroidism, unspecified: Secondary | ICD-10-CM

## 2022-12-12 MED ORDER — SYNTHROID 100 MCG PO TABS
100.0000 ug | ORAL_TABLET | Freq: Every day | ORAL | 0 refills | Status: DC
  Filled 2022-12-12: qty 30, 30d supply, fill #0
  Filled 2023-01-14: qty 90, 90d supply, fill #0

## 2022-12-17 ENCOUNTER — Other Ambulatory Visit (HOSPITAL_BASED_OUTPATIENT_CLINIC_OR_DEPARTMENT_OTHER): Payer: Self-pay

## 2023-01-14 ENCOUNTER — Other Ambulatory Visit (HOSPITAL_BASED_OUTPATIENT_CLINIC_OR_DEPARTMENT_OTHER): Payer: Self-pay

## 2023-01-15 ENCOUNTER — Other Ambulatory Visit (HOSPITAL_BASED_OUTPATIENT_CLINIC_OR_DEPARTMENT_OTHER): Payer: Self-pay

## 2023-01-15 ENCOUNTER — Telehealth: Payer: Self-pay

## 2023-01-15 DIAGNOSIS — E039 Hypothyroidism, unspecified: Secondary | ICD-10-CM

## 2023-01-15 NOTE — Telephone Encounter (Signed)
Pt called stating that when she went to the pharmacy to pick up her thyroid medication, she found out that her insurance is not going to cover it. States that she has 1 pill left and does not know what she should do. She states that she took levothyroxine before and experienced hair loss but if PCP thinks that is the only option she can try it again. Advised pt to call insurance and see what is on formulary and she states when she did that they told her they do not cover name brand meds and to ask Dr and hung up on her. Please advise.

## 2023-01-16 ENCOUNTER — Other Ambulatory Visit (HOSPITAL_BASED_OUTPATIENT_CLINIC_OR_DEPARTMENT_OTHER): Payer: Self-pay

## 2023-01-16 MED ORDER — LEVOTHYROXINE SODIUM 100 MCG PO TABS
100.0000 ug | ORAL_TABLET | Freq: Every day | ORAL | 3 refills | Status: DC
  Filled 2023-01-16: qty 90, 90d supply, fill #0
  Filled 2023-04-17: qty 90, 90d supply, fill #1
  Filled 2023-07-30: qty 90, 90d supply, fill #2
  Filled 2023-11-09: qty 90, 90d supply, fill #3

## 2023-01-16 NOTE — Telephone Encounter (Signed)
I have changed the prescription to levothyroxine due to insurance coverage.  The only other option is is for her to pay out-of-pocket for Synthroid.

## 2023-01-16 NOTE — Addendum Note (Signed)
Addended by: Howard Pouch A on: 01/16/2023 09:23 AM   Modules accepted: Orders

## 2023-01-16 NOTE — Telephone Encounter (Signed)
Spoke with patient regarding results/recommendations.  

## 2023-02-25 ENCOUNTER — Encounter: Payer: 59 | Admitting: Family Medicine

## 2023-02-25 DIAGNOSIS — M545 Low back pain, unspecified: Secondary | ICD-10-CM

## 2023-02-25 NOTE — Patient Instructions (Signed)
No follow-ups on file.        Great to see you today.  I have refilled the medication(s) we provide.   If labs were collected, we will inform you of lab results once received either by echart message or telephone call.   - echart message- for normal results that have been seen by the patient already.   - telephone call: abnormal results or if patient has not viewed results in their echart.  

## 2023-02-25 NOTE — Progress Notes (Unsigned)
No show- cancel after appt d/t care trouble.   Patient ID: Molly Park, female  DOB: 1961/05/13, 62 y.o.   MRN: [id number] Patient Care Team    Relationship Specialty Notifications Start End  Natalia Leatherwood, DO PCP - General Family Medicine  01/14/17   Romualdo Bolk, MD Consulting Physician Obstetrics and Gynecology  01/14/18   Iva Boop, MD Consulting Physician Gastroenterology  02/21/21     No chief complaint on file.   Subjective: Molly Park is a 62 y.o.  Female  present for CPE and Chronic Conditions/illness Management  All past medical history, surgical history, allergies, family history, immunizations, medications and social history were updated in the electronic medical record today. All recent labs, ED visits and hospitalizations within the last year were reviewed.  Health maintenance:  Colonoscopy: screen completed 01/21/2014 w/ polyps,  [facility]. She was told 5 year follow up. Now was referred to Dr. Leone Payor last year. *** Mammogram: completed: 02/2022,  [facility], > ordered Cervical cancer screening: last pap: 09/11/2022, LGSIL- 1 yr recall results: Dr. Oscar La  Immunizations: tdap 01/2017 UTD. Influenza UTD 2023(encouraged yearly). Shingrix series completed Infectious disease: HIV & Hep C completed Assistive device: none Oxygen WUJ:WJXB Patient has a Dental home. Hospitalizations/ED visits: reviewed  Hypothyroidism, unspecified type Thyroid levels have been stable.   Patient reports compliance with levothyroxine 100 mcg daily.   Hot flashes/focus Patient reports compliance with Effexor, on this medication she has seen improvement in her focus and her heart pressures are stable.    Mixed hyperlipidemia Patient reports compliance with atorvastatin nightly   Lumbar pain/arthritis She reports she has continued diclofenac once daily with food.  To help with her chronic arthritis   GERD: Patient reports symptoms are mild n  omeprazole 40 mg daily.  Symptoms do return if she discontinues omeprazole.  Symptoms present prior to start of NSAIDs and have not worsened since.       02/22/2022    8:08 AM 09/17/2021    9:39 AM 02/21/2021    8:03 AM 02/13/2021    1:30 PM 01/26/2020    8:14 AM  Depression screen PHQ 2/9  Decreased Interest 0 0 0 1 0  Down, Depressed, Hopeless 0 0 0 0 0  PHQ - 2 Score 0 0 0 1 0  Altered sleeping     1  Tired, decreased energy     0  Change in appetite     0  Feeling bad or failure about yourself      0  Trouble concentrating     0  Moving slowly or fidgety/restless     0  Suicidal thoughts     0  PHQ-9 Score     1  Difficult doing work/chores     Not difficult at all      01/20/2019    8:55 AM 10/27/2018    3:59 PM  GAD 7 : Generalized Anxiety Score  Nervous, Anxious, on Edge 0 1  Control/stop worrying 0 1  Worry too much - different things 0 1  Trouble relaxing 0 1  Restless 0 1  Easily annoyed or irritable 0 0  Afraid - awful might happen 0 0  Total GAD 7 Score 0 5  Anxiety Difficulty Not difficult at all Not difficult at all     Immunization History  Administered Date(s) Administered   Hepatitis A, Ped/Adol-2 Dose 12/07/2012   Influenza, Seasonal, Injecte, Preservative Fre 09/15/2014   Influenza,inj,Quad PF,6+  Mos 08/11/2022   Influenza-Unspecified 09/15/2014, 08/11/2016, 09/15/2018, 08/11/2020, 08/11/2021   PFIZER(Purple Top)SARS-COV-2 Vaccination 11/16/2019, 12/06/2019   Tdap 01/14/2017   Zoster Recombinat (Shingrix) 01/20/2019, 02/22/2022     Past Medical History:  Diagnosis Date   Bilateral edema of lower extremity    tried on low dose lasix by prior provider   Chicken pox    Closed fracture of lower end of right radius with routine healing 08/18/2019   Dysphagia 07/22/2018   Hemorrhoid    History of COVID-19 02/15/2021   HSV infection    Hypothyroidism    PONV (postoperative nausea and vomiting)    Rotator cuff impingement syndrome of right shoulder     No Known Allergies Past Surgical History:  Procedure Laterality Date   APPENDECTOMY  1973   BREAST EXCISIONAL BIOPSY Right 2014   EXAM UNDER ANESTHESIA WITH MANIPULATION OF SHOULDER Right 01/17/2020   Procedure: Right shoulder manipulation under anesthesia;  Surgeon: Jones Broom, MD;  Location: [facility];  Service: Orthopedics;  Laterality: Right;   HEMORRHOID SURGERY     OPEN REDUCTION INTERNAL FIXATION (ORIF) DISTAL RADIAL FRACTURE Right 08/12/2019   Procedure: OPEN REDUCTION INTERNAL FIXATION (ORIF) DISTAL RADIAL FRACTURE;  Surgeon: Betha Loa, MD;  Location: [facility];  Service: Orthopedics;  Laterality: Right;   SHOULDER INJECTION Right 01/17/2020   Procedure: SHOULDER STEROID INJECTION;  Surgeon: Jones Broom, MD;  Location: [facility];  Service: Orthopedics;  Laterality: Right;   TUBAL LIGATION  2004   Family History  Problem Relation Age of Onset   Diabetes Mother    Valvular heart disease Father    Breast cancer Maternal Aunt        77   Social History   Social History Narrative   Patient is married to [place]. They have 5 children btw them.   BA degree. 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   Drinks caffeine.   Wears a seatbelt, wears a bicycle helmet, smoke detector in the home.   Firearms in the home.   Exercises routinely.   Feels safe in her relationships.    Allergies as of 02/25/2023   No Known Allergies      Medication List        Accurate as of February 25, 2023  7:20 AM. If you have any questions, ask your nurse or doctor.          atorvastatin 20 MG tablet Commonly known as: LIPITOR Take 1 tablet (20 mg total) by mouth daily.   ciprofloxacin 0.3 % ophthalmic solution Commonly known as: CILOXAN Place 2 drops into both eyes every 4 (four) hours while awake. Administer 1 drop, every 2 hours, while awake, for 2 days. Then 1 drop, every 4 hours, while awake, for the  next 5 days.   Diclofenac Sodium CR 100 MG 24 hr tablet TAKE 1 TABLET (100 MG TOTAL) BY MOUTH DAILY.   gabapentin 300 MG capsule Commonly known as: NEURONTIN Take 1 capsule (300 mg total) by mouth 3 (three) times daily as needed for nerve pain.   levothyroxine 100 MCG tablet Commonly known as: Synthroid Take 1 tablet (100 mcg total) by mouth daily.   omeprazole 40 MG capsule Commonly known as: PRILOSEC Take 1 capsule (40 mg total) by mouth daily.   SM Lorata-dine D 10-240 MG 24 hr tablet Generic drug: loratadine-pseudoephedrine Take 1 tablet by mouth daily.   triamcinolone cream 0.1 % Commonly known as: KENALOG Apply 1  Application topically 2 (two) times daily.   triamcinolone cream 0.1 % Commonly known as: KENALOG Apply 1 Application topically 2 (two) times daily.   venlafaxine XR 150 MG 24 hr capsule Commonly known as: Effexor XR Take 1 capsule (150 mg total) by mouth daily with breakfast.   Vitamin D 50 MCG (2000 UT) tablet Take 2,000 Units by mouth daily.        All past medical history, surgical history, allergies, family history, immunizations andmedications were updated in the EMR today and reviewed under the history and medication portions of their EMR.     No results found for this or any previous visit (from the past 2160 hour(s)).    ROS 14 pt review of systems performed and negative (unless mentioned in an HPI) Objective: LMP  (LMP Unknown)  Physical Exam Vitals and nursing note reviewed.  Constitutional:      General: She is not in acute distress.    Appearance: Normal appearance. She is not ill-appearing or toxic-appearing.  HENT:     Head: Normocephalic and atraumatic.     Right Ear: Tympanic membrane, ear canal and external ear normal. There is no impacted cerumen.     Left Ear: Tympanic membrane, ear canal and external ear normal. There is no impacted cerumen.     Nose: No congestion or rhinorrhea.     Mouth/Throat:     Mouth: Mucous  membranes are moist.     Pharynx: Oropharynx is clear. No oropharyngeal exudate or posterior oropharyngeal erythema.  Eyes:     General: No scleral icterus.       Right eye: No discharge.        Left eye: No discharge.     Extraocular Movements: Extraocular movements intact.     Conjunctiva/sclera: Conjunctivae normal.     Pupils: Pupils are equal, round, and reactive to light.  Cardiovascular:     Rate and Rhythm: Normal rate and regular rhythm.     Pulses: Normal pulses.     Heart sounds: Normal heart sounds. No murmur heard.    No friction rub. No gallop.  Pulmonary:     Effort: Pulmonary effort is normal. No respiratory distress.     Breath sounds: Normal breath sounds. No stridor. No wheezing, rhonchi or rales.  Chest:     Chest wall: No tenderness.  Abdominal:     General: Abdomen is flat. Bowel sounds are normal. There is no distension.     Palpations: Abdomen is soft. There is no mass.     Tenderness: There is no abdominal tenderness. There is no right CVA tenderness, left CVA tenderness, guarding or rebound.     Hernia: No hernia is present.  Musculoskeletal:        General: No swelling, tenderness or deformity. Normal range of motion.     Cervical back: Normal range of motion and neck supple. No rigidity or tenderness.     Right lower leg: No edema.     Left lower leg: No edema.  Lymphadenopathy:     Cervical: No cervical adenopathy.  Skin:    General: Skin is warm and dry.     Coloration: Skin is not jaundiced or pale.     Findings: No bruising, erythema, lesion or rash.  Neurological:     General: No focal deficit present.     Mental Status: She is alert and oriented to person, place, and time. Mental status is at baseline.     Cranial Nerves: No cranial nerve  deficit.     Sensory: No sensory deficit.     Motor: No weakness.     Coordination: Coordination normal.     Gait: Gait normal.     Deep Tendon Reflexes: Reflexes normal.  Psychiatric:        Mood and  Affect: Mood normal.        Behavior: Behavior normal.        Thought Content: Thought content normal.        Judgment: Judgment normal.       No results found.  Assessment/plan: Molly Park is a 61 y.o. female present for CPE and routine chronic conditions Hypothyroidism due to acquired atrophy of thyroid Labs collected today.  Continue levo 100 mcg, if dose needs altered will do so on refills after results received TSH and T4 free collected  Mixed hyperlipidemia/On statin therapy/Overweight (BMI 25.0-29.9) Continue Lipitor Lipids collected today  Vitamin D deficiency Continue vitamin D supplement Vitamin D collected today  Hot flashes/focus Stable Continue effexor 150 qd  Elevated hemoglobin A1c Hemoglobin A1c collected today  Arthritis/lumbar pain Stable Continue diclofenac QD Gastroesophageal reflux disease without esophagitis Stable Continue omeprazole qd Vitamin D collected today, B12 magcollected  Polyp of colon, unspecified part of colon, unspecified type/colon cancer screening - Ambulatory referral to Gastroenterology placed again***  Encounter for preventive health examination Colonoscopy: screen completed 01/21/2014 w/ polyps,  at [facility]. She was told 5 year follow up. Now was referred to Dr. Leone Payor last year. *** Mammogram: completed: 02/2022,  [facility], > ordered Cervical cancer screening: last pap: 09/11/2022, LGSIL- 1 yr recall results: Dr. Oscar La  Immunizations: tdap 01/2017 UTD. Influenza UTD 2023(encouraged yearly). Shingrix series completed Infectious disease: HIV & Hep C completed Patient was encouraged to exercise greater than 150 minutes a week. Patient was encouraged to choose a diet filled with fresh fruits and vegetables, and lean meats. AVS provided to patient today for education/recommendation on gender specific health and safety maintenance.  No follow-ups on file.  No orders of the defined types were  placed in this encounter.  No orders of the defined types were placed in this encounter.  Referral Orders  No referral(s) requested today      Electronically signed by: Felix Pacini, DO [facility] [place]

## 2023-02-26 ENCOUNTER — Ambulatory Visit: Payer: Self-pay | Admitting: Family Medicine

## 2023-02-26 DIAGNOSIS — M545 Low back pain, unspecified: Secondary | ICD-10-CM

## 2023-03-03 ENCOUNTER — Encounter: Payer: Self-pay | Admitting: Family Medicine

## 2023-03-03 ENCOUNTER — Ambulatory Visit (INDEPENDENT_AMBULATORY_CARE_PROVIDER_SITE_OTHER): Payer: Self-pay | Admitting: Family Medicine

## 2023-03-03 ENCOUNTER — Other Ambulatory Visit (HOSPITAL_BASED_OUTPATIENT_CLINIC_OR_DEPARTMENT_OTHER): Payer: Self-pay

## 2023-03-03 VITALS — BP 131/85 | HR 76 | Temp 98.1°F | Ht 64.0 in | Wt 164.8 lb

## 2023-03-03 DIAGNOSIS — E034 Atrophy of thyroid (acquired): Secondary | ICD-10-CM

## 2023-03-03 DIAGNOSIS — M545 Low back pain, unspecified: Secondary | ICD-10-CM

## 2023-03-03 DIAGNOSIS — R232 Flushing: Secondary | ICD-10-CM

## 2023-03-03 DIAGNOSIS — M1990 Unspecified osteoarthritis, unspecified site: Secondary | ICD-10-CM

## 2023-03-03 DIAGNOSIS — R7309 Other abnormal glucose: Secondary | ICD-10-CM

## 2023-03-03 DIAGNOSIS — Z0000 Encounter for general adult medical examination without abnormal findings: Secondary | ICD-10-CM

## 2023-03-03 DIAGNOSIS — E559 Vitamin D deficiency, unspecified: Secondary | ICD-10-CM

## 2023-03-03 DIAGNOSIS — Z79899 Other long term (current) drug therapy: Secondary | ICD-10-CM

## 2023-03-03 DIAGNOSIS — Z1211 Encounter for screening for malignant neoplasm of colon: Secondary | ICD-10-CM

## 2023-03-03 DIAGNOSIS — E782 Mixed hyperlipidemia: Secondary | ICD-10-CM

## 2023-03-03 DIAGNOSIS — Z1231 Encounter for screening mammogram for malignant neoplasm of breast: Secondary | ICD-10-CM

## 2023-03-03 DIAGNOSIS — K219 Gastro-esophageal reflux disease without esophagitis: Secondary | ICD-10-CM

## 2023-03-03 LAB — BASIC METABOLIC PANEL
BUN: 18 mg/dL (ref 6–23)
CO2: 28 mEq/L (ref 19–32)
Calcium: 9 mg/dL (ref 8.4–10.5)
Chloride: 104 mEq/L (ref 96–112)
Creatinine, Ser: 0.65 mg/dL (ref 0.40–1.20)
GFR: 95.92 mL/min (ref >60.00)
Glucose, Bld: 86 mg/dL (ref 70–99)
Potassium: 4.3 mEq/L (ref 3.5–5.1)
Sodium: 140 mEq/L (ref 135–145)

## 2023-03-03 LAB — TSH: TSH: 0.62 u[IU]/mL (ref 0.35–5.50)

## 2023-03-03 MED ORDER — ATORVASTATIN CALCIUM 20 MG PO TABS
20.0000 mg | ORAL_TABLET | Freq: Every day | ORAL | 11 refills | Status: DC
  Filled 2023-03-03 – 2023-03-18 (×2): qty 30, 30d supply, fill #0
  Filled 2023-04-22: qty 30, 30d supply, fill #1
  Filled 2023-06-05: qty 30, 30d supply, fill #2
  Filled 2023-07-09: qty 30, 30d supply, fill #3
  Filled 2023-08-14: qty 30, 30d supply, fill #4
  Filled 2023-09-14: qty 30, 30d supply, fill #5
  Filled 2023-10-13: qty 30, 30d supply, fill #6
  Filled 2023-11-16: qty 30, 30d supply, fill #7
  Filled 2023-12-17: qty 30, 30d supply, fill #8
  Filled 2024-01-12: qty 30, 30d supply, fill #9
  Filled 2024-02-13: qty 30, 30d supply, fill #10

## 2023-03-03 MED ORDER — OMEPRAZOLE 40 MG PO CPDR
40.0000 mg | DELAYED_RELEASE_CAPSULE | Freq: Every day | ORAL | 11 refills | Status: DC
  Filled 2023-03-03 – 2023-03-28 (×2): qty 30, 30d supply, fill #0
  Filled 2023-05-04: qty 30, 30d supply, fill #1
  Filled 2023-06-05: qty 30, 30d supply, fill #2
  Filled 2023-07-09: qty 30, 30d supply, fill #3
  Filled 2023-08-14: qty 30, 30d supply, fill #4
  Filled 2023-09-14: qty 30, 30d supply, fill #5
  Filled 2023-10-13: qty 30, 30d supply, fill #6
  Filled 2023-11-16: qty 30, 30d supply, fill #7
  Filled 2023-12-17: qty 30, 30d supply, fill #8
  Filled 2024-01-12: qty 30, 30d supply, fill #9
  Filled 2024-02-03 – 2024-02-05 (×2): qty 30, 30d supply, fill #10

## 2023-03-03 MED ORDER — VENLAFAXINE HCL ER 150 MG PO CP24
150.0000 mg | ORAL_CAPSULE | Freq: Every day | ORAL | 11 refills | Status: DC
  Filled 2023-03-03 – 2023-03-28 (×2): qty 30, 30d supply, fill #0
  Filled 2023-05-04: qty 30, 30d supply, fill #1
  Filled 2023-06-05: qty 30, 30d supply, fill #2
  Filled 2023-07-09: qty 30, 30d supply, fill #3
  Filled 2023-08-14: qty 30, 30d supply, fill #4
  Filled 2023-09-14: qty 30, 30d supply, fill #5
  Filled 2023-10-13: qty 30, 30d supply, fill #6
  Filled 2023-11-16: qty 30, 30d supply, fill #7
  Filled 2023-12-17: qty 30, 30d supply, fill #8
  Filled 2024-01-12: qty 30, 30d supply, fill #9
  Filled 2024-02-13: qty 30, 30d supply, fill #10

## 2023-03-03 MED ORDER — DICLOFENAC SODIUM ER 100 MG PO TB24
ORAL_TABLET | Freq: Every day | ORAL | 11 refills | Status: DC
Start: 2023-03-03 — End: 2023-10-13
  Filled 2023-03-03 – 2023-03-18 (×2): qty 30, 30d supply, fill #0

## 2023-03-03 NOTE — Patient Instructions (Addendum)
Return in about 1 year (around 03/03/2024) for Routine chronic condition follow-up.        Great to see you today.  I have refilled the medication(s) we provide.   If labs were collected, we will inform you of lab results once received either by echart message or telephone call.   - echart message- for normal results that have been seen by the patient already.   - telephone call: abnormal results or if patient has not viewed results in their echart.

## 2023-03-03 NOTE — Progress Notes (Signed)
Patient ID: Molly Park, female  DOB: Dec 12, 1960, 62 y.o.   MRN: [id number] Patient Care Team    Relationship Specialty Notifications Start End  Natalia Leatherwood, DO PCP - General Family Medicine  01/14/17   Romualdo Bolk, MD Consulting Physician Obstetrics and Gynecology  01/14/18   Iva Boop, MD Consulting Physician Gastroenterology  02/21/21     Chief Complaint  Patient presents with   Annual Exam    And cmc; pt is fasting    Subjective: Molly Park is a 62 y.o.  Female  present for CPE and Chronic Conditions/illness Management All past medical history, surgical history, allergies, family history, immunizations, medications and social history were updated in the electronic medical record today. All recent labs, ED visits and hospitalizations within the last year were reviewed.  Health maintenance:  Colonoscopy: no fhx, screen completed 01/21/2014 w/ polyps,  at [facility] specialist. She was told 5 year follow up. Now has scheduled with Dr. Leone Payor. postponed bc of no records from digestive health Mammogram: completed: 03/08/2022,  [facility] Cervical cancer screening: last pap: 09/2022 results: Dr. Oscar La  1 yr rpt Immunizations: tdap 01/2017 UTD. Influenza UTD (encouraged yearly). Shingrix completed  Infectious disease: HIV & Hep C completed Patient has a Dental home. Hospitalizations/ED visits: reviewed  Hypothyroidism, unspecified type Thyroid levels have been stable.   Patient reports compliance  with levothyroxine 100 mcg daily.   Hot flashes/focus Patient reports she has seen improvement in her focus and her hot flashes are stable.  She reports compliance with Effexor 150 mg daily.   Mixed hyperlipidemia She is compliant  with  atorvastatin.    Lumbar pain/arthritis She reports she has continued diclofenac once daily with food.  She denies any worsening of reflux symptoms.     GERD: Patient reports symptoms are well-controlled on  omeprazole 40 mg daily.  Symptoms do return if she discontinues omeprazole.  Symptoms present prior to start of NSAIDs and have not worsened since.       02/22/2022    8:08 AM 09/17/2021    9:39 AM 02/21/2021    8:03 AM 02/13/2021    1:30 PM 01/26/2020    8:14 AM  Depression screen PHQ 2/9  Decreased Interest 0 0 0 1 0  Down, Depressed, Hopeless 0 0 0 0 0  PHQ - 2 Score 0 0 0 1 0  Altered sleeping     1  Tired, decreased energy     0  Change in appetite     0  Feeling bad or failure about yourself      0  Trouble concentrating     0  Moving slowly or fidgety/restless     0  Suicidal thoughts     0  PHQ-9 Score     1  Difficult doing work/chores     Not difficult at all      01/20/2019    8:55 AM 10/27/2018    3:59 PM  GAD 7 : Generalized Anxiety Score  Nervous, Anxious, on Edge 0 1  Control/stop worrying 0 1  Worry too much - different things 0 1  Trouble relaxing 0 1  Restless 0 1  Easily annoyed or irritable 0 0  Afraid - awful might happen 0 0  Total GAD 7 Score 0 5  Anxiety Difficulty Not difficult at all Not difficult at all     Immunization History  Administered Date(s) Administered   Hepatitis A, Ped/Adol-2 Dose  12/07/2012   Influenza, Seasonal, Injecte, Preservative Fre 09/15/2014   Influenza,inj,Quad PF,6+ Mos 08/11/2022   Influenza-Unspecified 09/15/2014, 08/11/2016, 09/15/2018, 08/11/2020, 08/11/2021   PFIZER(Purple Top)SARS-COV-2 Vaccination 11/16/2019, 12/06/2019   Tdap 01/14/2017   Zoster Recombinat (Shingrix) 01/20/2019, 02/22/2022    Past Medical History:  Diagnosis Date   Bilateral edema of lower extremity    tried on low dose lasix by prior provider   Chicken pox    Closed fracture of lower end of right radius with routine healing 08/18/2019   Dysphagia 07/22/2018   Hemorrhoid    History of COVID-19 02/15/2021   HSV infection    Hypothyroidism    Nonallopathic lesion of cervical region 08/24/2020   PONV (postoperative nausea and vomiting)     Rotator cuff impingement syndrome of right shoulder    Sprain of sacroiliac ligament 10/22/2021   No Known Allergies Past Surgical History:  Procedure Laterality Date   APPENDECTOMY  1973   BREAST EXCISIONAL BIOPSY Right 2014   EXAM UNDER ANESTHESIA WITH MANIPULATION OF SHOULDER Right 01/17/2020   Procedure: Right shoulder manipulation under anesthesia;  Surgeon: Jones Broom, MD;  Location: [facility];  Service: Orthopedics;  Laterality: Right;   HEMORRHOID SURGERY     OPEN REDUCTION INTERNAL FIXATION (ORIF) DISTAL RADIAL FRACTURE Right 08/12/2019   Procedure: OPEN REDUCTION INTERNAL FIXATION (ORIF) DISTAL RADIAL FRACTURE;  Surgeon: Betha Loa, MD;  Location: [facility];  Service: Orthopedics;  Laterality: Right;   SHOULDER INJECTION Right 01/17/2020   Procedure: SHOULDER STEROID INJECTION;  Surgeon: Jones Broom, MD;  Location:  [facility];  Service: Orthopedics;  Laterality: Right;   TUBAL LIGATION  2004   Family History  Problem Relation Age of Onset   Diabetes Mother    Valvular heart disease Father    Breast cancer Maternal Aunt        41   Social History   Social History Narrative   Patient is married to [place]. They have 5 children btw them.   BA degree. 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   Drinks caffeine.   Wears a seatbelt, wears a bicycle helmet, smoke detector in the home.   Firearms in the home.   Exercises routinely.   Feels safe in her relationships.    Allergies as of 03/03/2023   No Known Allergies      Medication List        Accurate as of March 03, 2023 11:17 AM. If you have any questions, ask your nurse or doctor.          atorvastatin 20 MG tablet Commonly known as: LIPITOR Take 1 tablet (20 mg total) by mouth daily.   Diclofenac Sodium CR 100 MG 24 hr tablet TAKE 1 TABLET (100 MG TOTAL) BY MOUTH DAILY.   gabapentin 300 MG capsule Commonly known as:  NEURONTIN Take 1 capsule (300 mg total) by mouth 3 (three) times daily as needed for nerve pain.   levothyroxine 100 MCG tablet Commonly known as: Synthroid Take 1 tablet (100 mcg total) by mouth daily.   omeprazole 40 MG capsule Commonly known as: PRILOSEC Take 1 capsule (40 mg total) by mouth daily.   SM Lorata-dine D 10-240 MG 24 hr tablet Generic drug: loratadine-pseudoephedrine Take 1 tablet by mouth daily.   triamcinolone cream 0.1 % Commonly known as: KENALOG Apply 1 Application topically 2 (two) times daily.   venlafaxine XR 150 MG 24 hr capsule Commonly known as: Effexor XR Take  1 capsule (150 mg total) by mouth daily with breakfast.   Vitamin D 50 MCG (2000 UT) tablet Take 2,000 Units by mouth daily.        All past medical history, surgical history, allergies, family history, immunizations andmedications were updated in the EMR today and reviewed under the history and medication portions of their EMR.     No results found for this or any previous visit (from the past 2160 hour(s)).    ROS 14 pt review of systems performed and negative (unless mentioned in an HPI) Objective: BP 131/85   Pulse 76   Temp 98.1 F (36.7 C)   Ht  (1.626 m)   Wt 164 lb 12.8 oz (74.8 kg)   LMP  (LMP Unknown)   SpO2 98%   BMI 28.29 kg/m  Physical Exam Vitals and nursing note reviewed.  Constitutional:      General: She is not in acute distress.    Appearance: Normal appearance. She is not ill-appearing or toxic-appearing.  HENT:     Head: Normocephalic and atraumatic.     Right Ear: Tympanic membrane, ear canal and external ear normal. There is no impacted cerumen.     Left Ear: Tympanic membrane, ear canal and external ear normal. There is no impacted cerumen.     Nose: No congestion or rhinorrhea.     Mouth/Throat:     Mouth: Mucous membranes are moist.     Pharynx: Oropharynx is clear. No oropharyngeal exudate or posterior oropharyngeal erythema.  Eyes:      General: No scleral icterus.       Right eye: No discharge.        Left eye: No discharge.     Extraocular Movements: Extraocular movements intact.     Conjunctiva/sclera: Conjunctivae normal.     Pupils: Pupils are equal, round, and reactive to light.  Cardiovascular:     Rate and Rhythm: Normal rate and regular rhythm.     Pulses: Normal pulses.     Heart sounds: Normal heart sounds. No murmur heard.    No friction rub. No gallop.  Pulmonary:     Effort: Pulmonary effort is normal. No respiratory distress.     Breath sounds: Normal breath sounds. No stridor. No wheezing, rhonchi or rales.  Chest:     Chest wall: No tenderness.  Abdominal:     General: Abdomen is flat. Bowel sounds are normal. There is no distension.     Palpations: Abdomen is soft. There is no mass.     Tenderness: There is no abdominal tenderness. There is no right CVA tenderness, left CVA tenderness, guarding or rebound.     Hernia: No hernia is present.  Musculoskeletal:        General: No swelling, tenderness or deformity. Normal range of motion.     Cervical back: Normal range of motion and neck supple. No rigidity or tenderness.     Right lower leg: No edema.     Left lower leg: No edema.  Lymphadenopathy:     Cervical: No cervical adenopathy.  Skin:    General: Skin is warm and dry.     Coloration: Skin is not jaundiced or pale.     Findings: No bruising, erythema, lesion or rash.  Neurological:     General: No focal deficit present.     Mental Status: She is alert and oriented to person, place, and time. Mental status is at baseline.     Cranial Nerves: No cranial  nerve deficit.     Sensory: No sensory deficit.     Motor: No weakness.     Coordination: Coordination normal.     Gait: Gait normal.     Deep Tendon Reflexes: Reflexes normal.  Psychiatric:        Mood and Affect: Mood normal.        Behavior: Behavior normal.        Thought Content: Thought content normal.        Judgment: Judgment  normal.     Assessment/plan: Molly Park is a 62 y.o. female present for CPE and routine chronic conditions Hypothyroidism due to acquired atrophy of thyroid Stable Continue  levo 100 mcg, if dose needs altered will do so on refills after results received - TSH  Mixed hyperlipidemia/On statin therapy/Overweight (BMI 25.0-29.9) Continue  atorvastatin  Vitamin D deficiency - VITAMIN D 25 Hydroxy (Vit-D Deficiency, Fractures) collected today  Hot flashes Stable  continue  effexor 150 qd  Arthritis/lumbar pain Stable Continue  diclofenac QD (encouraged her to look into goodrx- may need to use short acting)  Gastroesophageal reflux disease without esophagitis Stable Continue omeprazole qd  Encounter for preventive health examination Patient was encouraged to exercise greater than 150 minutes a week. Patient was encouraged to choose a diet filled with fresh fruits and vegetables, and lean meats. AVS provided to patient today for education/recommendation on gender specific health and safety maintenance. Colonoscopy: no fhx, screen completed 01/21/2014 w/ polyps,  at digestive health specialist. She was told 5 year follow up. Now has scheduled with Dr. Leone Payor. postponed bc of no records from digestive health Mammogram: completed: 03/08/2022,  [facility] Cervical cancer screening: last pap: 09/2022 results: Dr. Oscar La  1 yr rpt Immunizations: tdap 01/2017 UTD. Influenza UTD (encouraged yearly). Shingrix completed  Infectious disease: HIV & Hep C completed  Return in about 1 year (around 03/03/2024) for Routine chronic condition follow-up.  Orders Placed This Encounter  Procedures   MM 3D SCREENING MAMMOGRAM BILATERAL BREAST   TSH   Basic Metabolic Panel (BMET)   Meds ordered this encounter  Medications   atorvastatin (LIPITOR) 20 MG tablet    Sig: Take 1 tablet (20 mg total) by mouth daily.    Dispense:  30 tablet    Refill:  11   Diclofenac Sodium CR 100 MG 24 hr  tablet    Sig: TAKE 1 TABLET (100 MG TOTAL) BY MOUTH DAILY.    Dispense:  30 tablet    Refill:  11   omeprazole (PRILOSEC) 40 MG capsule    Sig: Take 1 capsule (40 mg total) by mouth daily.    Dispense:  30 capsule    Refill:  11   venlafaxine XR (EFFEXOR XR) 150 MG 24 hr capsule    Sig: Take 1 capsule (150 mg total) by mouth daily with breakfast.    Dispense:  30 capsule    Refill:  11   Referral Orders  No referral(s) requested today      Electronically signed by: Felix Pacini, DO [facility] [place]

## 2023-03-11 ENCOUNTER — Other Ambulatory Visit (HOSPITAL_BASED_OUTPATIENT_CLINIC_OR_DEPARTMENT_OTHER): Payer: Self-pay

## 2023-03-18 ENCOUNTER — Encounter: Payer: Self-pay | Admitting: Family Medicine

## 2023-03-18 ENCOUNTER — Other Ambulatory Visit (HOSPITAL_BASED_OUTPATIENT_CLINIC_OR_DEPARTMENT_OTHER): Payer: Self-pay

## 2023-03-20 ENCOUNTER — Other Ambulatory Visit (HOSPITAL_BASED_OUTPATIENT_CLINIC_OR_DEPARTMENT_OTHER): Payer: Self-pay

## 2023-03-25 ENCOUNTER — Other Ambulatory Visit (HOSPITAL_BASED_OUTPATIENT_CLINIC_OR_DEPARTMENT_OTHER): Payer: Self-pay

## 2023-03-26 NOTE — Telephone Encounter (Signed)
If too expensive for her, I would recommend she take OTC Aleve every 12 hours with food in its place.

## 2023-03-27 ENCOUNTER — Encounter: Payer: Self-pay | Admitting: Family Medicine

## 2023-03-28 ENCOUNTER — Other Ambulatory Visit (HOSPITAL_BASED_OUTPATIENT_CLINIC_OR_DEPARTMENT_OTHER): Payer: Self-pay

## 2023-04-18 ENCOUNTER — Other Ambulatory Visit (HOSPITAL_BASED_OUTPATIENT_CLINIC_OR_DEPARTMENT_OTHER): Payer: Self-pay

## 2023-04-23 ENCOUNTER — Other Ambulatory Visit (HOSPITAL_BASED_OUTPATIENT_CLINIC_OR_DEPARTMENT_OTHER): Payer: Self-pay

## 2023-05-05 ENCOUNTER — Other Ambulatory Visit (HOSPITAL_BASED_OUTPATIENT_CLINIC_OR_DEPARTMENT_OTHER): Payer: Self-pay

## 2023-05-06 ENCOUNTER — Other Ambulatory Visit (HOSPITAL_BASED_OUTPATIENT_CLINIC_OR_DEPARTMENT_OTHER): Payer: Self-pay

## 2023-06-05 ENCOUNTER — Other Ambulatory Visit (HOSPITAL_BASED_OUTPATIENT_CLINIC_OR_DEPARTMENT_OTHER): Payer: Self-pay

## 2023-06-09 ENCOUNTER — Other Ambulatory Visit (HOSPITAL_BASED_OUTPATIENT_CLINIC_OR_DEPARTMENT_OTHER): Payer: Self-pay

## 2023-06-11 ENCOUNTER — Encounter: Payer: Self-pay | Admitting: Family Medicine

## 2023-06-11 ENCOUNTER — Other Ambulatory Visit (HOSPITAL_BASED_OUTPATIENT_CLINIC_OR_DEPARTMENT_OTHER): Payer: Self-pay

## 2023-06-11 ENCOUNTER — Ambulatory Visit (INDEPENDENT_AMBULATORY_CARE_PROVIDER_SITE_OTHER): Payer: Self-pay | Admitting: Family Medicine

## 2023-06-11 VITALS — BP 100/60 | HR 90 | Temp 98.0°F | Ht 64.0 in | Wt 163.2 lb

## 2023-06-11 DIAGNOSIS — M4726 Other spondylosis with radiculopathy, lumbar region: Secondary | ICD-10-CM

## 2023-06-11 DIAGNOSIS — M5416 Radiculopathy, lumbar region: Secondary | ICD-10-CM

## 2023-06-11 DIAGNOSIS — M62838 Other muscle spasm: Secondary | ICD-10-CM

## 2023-06-11 MED ORDER — TRAMADOL HCL 50 MG PO TABS
50.0000 mg | ORAL_TABLET | Freq: Four times a day (QID) | ORAL | 0 refills | Status: DC | PRN
  Filled 2023-06-11: qty 60, 15d supply, fill #0

## 2023-06-11 MED ORDER — BACLOFEN 10 MG PO TABS
10.0000 mg | ORAL_TABLET | Freq: Three times a day (TID) | ORAL | 0 refills | Status: DC
  Filled 2023-06-11: qty 30, 10d supply, fill #0

## 2023-06-11 MED ORDER — KETOROLAC TROMETHAMINE 60 MG/2ML IM SOLN
60.0000 mg | Freq: Once | INTRAMUSCULAR | Status: AC
Start: 2023-06-11 — End: 2023-06-11
  Administered 2023-06-11: 60 mg via INTRAMUSCULAR

## 2023-06-11 MED ORDER — GABAPENTIN 300 MG PO CAPS
300.0000 mg | ORAL_CAPSULE | Freq: Every day | ORAL | 2 refills | Status: DC
  Filled 2023-06-11: qty 30, 30d supply, fill #0
  Filled 2023-07-09: qty 30, 30d supply, fill #1
  Filled 2023-08-14: qty 30, 30d supply, fill #2

## 2023-06-11 MED ORDER — PREDNISONE 10 MG PO TABS
ORAL_TABLET | ORAL | 0 refills | Status: DC
  Filled 2023-06-11: qty 21, 9d supply, fill #0

## 2023-06-11 NOTE — Addendum Note (Signed)
Addended by: Asencion Partridge on: 06/11/2023 11:06 AM   Modules accepted: Level of Service

## 2023-06-11 NOTE — Progress Notes (Signed)
Subjective  CC:  Chief Complaint  Patient presents with   Back Pain    Pt stated that she have some lower back pain that goes down the Rt leg that started yesterday. Pt has gotten worse since last night and she can not sit. Hurts more when she sits/bends    HPI: Molly Park is a 62 y.o. female who presents to the office today to address the problems listed above in the chief complaint. 62 year old female with known lumbar DJD by MRI in 2019, reviewed, presents due to right low back pain radiating into buttocks and right leg.  Started yesterday although it has been mildly sore prior to that.  She has been very active, caregiving for her elderly mother who has been hospitalized for over a month, sleeping at the hospital, lifting and bending and also working to create her garage to bring her mother home.  All of these activities have likely strained her back however yesterday, severe pain with radicular symptoms.  No weakness in the leg.  No bowel or bladder symptoms.  She is taking Motrin with some relief.  She started a new job this week and needs to be able to attend work and meetings.  No GI symptoms.  No fevers.  Assessment  1. Lumbar radiculopathy   2. Muscle spasm   3. Osteoarthritis of spine with radiculopathy, lumbar region      Plan  Lumbar radiculopathy with muscle spasm: Toradol given for pain relief in the office.  Patient tolerated well.  Start prednisone taper, tramadol as needed, baclofen, heat, stretching and will restart gabapentin for nighttime use.  Red flag symptoms are not present.  Discussed.  No need for imaging at this time.  She will follow-up if not proving.  Follow up: As needed Visit date not found  No orders of the defined types were placed in this encounter.  Meds ordered this encounter  Medications   predniSONE (DELTASONE) 10 MG tablet    Sig: Take 4 tablets by mouth for 2 days, then 3 tablets for 2 days, then 2 tablets for 2 days, then 1 tablet for 3  days    Dispense:  21 tablet    Refill:  0   baclofen (LIORESAL) 10 MG tablet    Sig: Take 1 tablet (10 mg total) by mouth 3 (three) times daily.    Dispense:  30 each    Refill:  0   traMADol (ULTRAM) 50 MG tablet    Sig: Take 1 tablet (50 mg total) by mouth every 6 (six) hours as needed for moderate pain.    Dispense:  60 tablet    Refill:  0   ketorolac (TORADOL) injection 60 mg   gabapentin (NEURONTIN) 300 MG capsule    Sig: Take 1 capsule (300 mg total) by mouth at bedtime.    Dispense:  30 capsule    Refill:  2      I reviewed the patients updated PMH, FH, and SocHx.    Patient Active Problem List   Diagnosis Date Noted   GERD (gastroesophageal reflux disease) 02/22/2022   On statin therapy 02/22/2022   Overweight (BMI 25.0-29.9) 02/22/2022   Arthritis 09/17/2021   Mixed hyperlipidemia 02/21/2021   Hot flashes 07/22/2018   Elevated hemoglobin A1c 07/22/2018   Lumbar pain 01/14/2018   Hypothyroidism 01/14/2017   Vitamin D deficiency 01/14/2017   Current Meds  Medication Sig   baclofen (LIORESAL) 10 MG tablet Take 1 tablet (10 mg total) by  mouth 3 (three) times daily.   gabapentin (NEURONTIN) 300 MG capsule Take 1 capsule (300 mg total) by mouth at bedtime.   predniSONE (DELTASONE) 10 MG tablet Take 4 tablets by mouth for 2 days, then 3 tablets for 2 days, then 2 tablets for 2 days, then 1 tablet for 3 days   traMADol (ULTRAM) 50 MG tablet Take 1 tablet (50 mg total) by mouth every 6 (six) hours as needed for moderate pain.    Allergies: Patient has No Known Allergies. Family History: Patient family history includes Breast cancer in her maternal aunt; Diabetes in her mother; Valvular heart disease in her father. Social History:  Patient  reports that she has never smoked. She has never used smokeless tobacco. She reports current alcohol use of about 1.0 standard drink of alcohol per week. She reports that she does not use drugs.  Review of  Systems: Constitutional: Negative for fever malaise or anorexia Cardiovascular: negative for chest pain Respiratory: negative for SOB or persistent cough Gastrointestinal: negative for abdominal pain  Objective  Vitals: BP 100/60   Pulse 90   Temp 98 F (36.7 C)   Ht 5\' 4"  (1.626 m)   Wt 163 lb 3.2 oz (74 kg)   LMP  (LMP Unknown)   SpO2 98%   BMI 28.01 kg/m  General standing upright, appears uncomfortable, antalgic gait Musculoskeletal back: Right paravertebral muscle spasm, tenderness at lower lumbar spine into buttocks, no SI joint tenderness, no sciatic notch tenderness, right hip is elevated due to spasm, negative straight leg raise, normal DTRs, limited strength testing due to pain.  Commons side effects, risks, benefits, and alternatives for medications and treatment plan prescribed today were discussed, and the patient expressed understanding of the given instructions. Patient is instructed to call or message via MyChart if he/she has any questions or concerns regarding our treatment plan. No barriers to understanding were identified. We discussed Red Flag symptoms and signs in detail. Patient expressed understanding regarding what to do in case of urgent or emergency type symptoms.  Medication list was reconciled, printed and provided to the patient in AVS. Patient instructions and summary information was reviewed with the patient as documented in the AVS. This note was prepared with assistance of Dragon voice recognition software. Occasional wrong-word or sound-a-like substitutions may have occurred due to the inherent limitations of voice recognition software

## 2023-07-09 ENCOUNTER — Other Ambulatory Visit (HOSPITAL_BASED_OUTPATIENT_CLINIC_OR_DEPARTMENT_OTHER): Payer: Self-pay

## 2023-07-30 ENCOUNTER — Other Ambulatory Visit (HOSPITAL_BASED_OUTPATIENT_CLINIC_OR_DEPARTMENT_OTHER): Payer: Self-pay

## 2023-08-14 ENCOUNTER — Other Ambulatory Visit (HOSPITAL_BASED_OUTPATIENT_CLINIC_OR_DEPARTMENT_OTHER): Payer: Self-pay

## 2023-09-14 ENCOUNTER — Other Ambulatory Visit: Payer: Self-pay | Admitting: Family Medicine

## 2023-09-15 ENCOUNTER — Other Ambulatory Visit (HOSPITAL_BASED_OUTPATIENT_CLINIC_OR_DEPARTMENT_OTHER): Payer: Self-pay

## 2023-09-15 MED ORDER — GABAPENTIN 300 MG PO CAPS
300.0000 mg | ORAL_CAPSULE | Freq: Every day | ORAL | 2 refills | Status: DC
  Filled 2023-09-15: qty 30, 30d supply, fill #0
  Filled 2023-10-13: qty 30, 30d supply, fill #1
  Filled 2023-11-16: qty 30, 30d supply, fill #2

## 2023-09-18 ENCOUNTER — Other Ambulatory Visit: Payer: Self-pay

## 2023-10-13 ENCOUNTER — Other Ambulatory Visit (HOSPITAL_BASED_OUTPATIENT_CLINIC_OR_DEPARTMENT_OTHER): Payer: Self-pay

## 2023-10-13 ENCOUNTER — Ambulatory Visit
Admission: RE | Admit: 2023-10-13 | Discharge: 2023-10-13 | Disposition: A | Discharge status: Home / Self Care | Payer: 59 | Source: Ambulatory Visit | Attending: Family Medicine | Admitting: Family Medicine

## 2023-10-13 ENCOUNTER — Encounter: Payer: Self-pay | Admitting: Family Medicine

## 2023-10-13 ENCOUNTER — Ambulatory Visit: Payer: 59 | Admitting: Family Medicine

## 2023-10-13 VITALS — BP 126/74 | HR 84 | Temp 97.8°F | Ht 64.0 in | Wt 163.2 lb

## 2023-10-13 DIAGNOSIS — M1712 Unilateral primary osteoarthritis, left knee: Secondary | ICD-10-CM | POA: Diagnosis not present

## 2023-10-13 DIAGNOSIS — M533 Sacrococcygeal disorders, not elsewhere classified: Secondary | ICD-10-CM

## 2023-10-13 DIAGNOSIS — M545 Low back pain, unspecified: Secondary | ICD-10-CM | POA: Diagnosis not present

## 2023-10-13 MED ORDER — DICLOFENAC SODIUM 75 MG PO TBEC
75.0000 mg | DELAYED_RELEASE_TABLET | Freq: Two times a day (BID) | ORAL | 2 refills | Status: DC
  Filled 2023-10-13: qty 60, 30d supply, fill #0
  Filled 2023-11-27: qty 60, 30d supply, fill #1
  Filled 2024-01-12: qty 60, 30d supply, fill #2
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
## 2023-10-28 NOTE — Progress Notes (Signed)
See mychart note Dear Para March, Your xrays confirm multilevel osteoarthritic changes.   Happy holidays.  Sincerely, Dr. Mardelle Matte

## 2023-11-10 ENCOUNTER — Other Ambulatory Visit (HOSPITAL_BASED_OUTPATIENT_CLINIC_OR_DEPARTMENT_OTHER): Payer: Self-pay

## 2023-11-17 ENCOUNTER — Other Ambulatory Visit (HOSPITAL_BASED_OUTPATIENT_CLINIC_OR_DEPARTMENT_OTHER): Payer: Self-pay

## 2023-11-27 ENCOUNTER — Other Ambulatory Visit (HOSPITAL_BASED_OUTPATIENT_CLINIC_OR_DEPARTMENT_OTHER): Payer: Self-pay

## 2023-12-17 ENCOUNTER — Other Ambulatory Visit: Payer: Self-pay | Admitting: Family Medicine

## 2023-12-18 ENCOUNTER — Other Ambulatory Visit (HOSPITAL_BASED_OUTPATIENT_CLINIC_OR_DEPARTMENT_OTHER): Payer: Self-pay

## 2023-12-18 MED ORDER — GABAPENTIN 300 MG PO CAPS
300.0000 mg | ORAL_CAPSULE | Freq: Every day | ORAL | 0 refills | Status: DC
  Filled 2023-12-18: qty 30, 30d supply, fill #0

## 2024-01-12 ENCOUNTER — Other Ambulatory Visit: Payer: Self-pay | Admitting: Family Medicine

## 2024-01-13 ENCOUNTER — Other Ambulatory Visit (HOSPITAL_BASED_OUTPATIENT_CLINIC_OR_DEPARTMENT_OTHER): Payer: Self-pay

## 2024-01-13 MED ORDER — GABAPENTIN 300 MG PO CAPS
300.0000 mg | ORAL_CAPSULE | Freq: Every day | ORAL | 0 refills | Status: DC
  Filled 2024-01-13: qty 30, 30d supply, fill #0

## 2024-02-03 ENCOUNTER — Other Ambulatory Visit: Payer: Self-pay | Admitting: Family Medicine

## 2024-02-03 DIAGNOSIS — E039 Hypothyroidism, unspecified: Secondary | ICD-10-CM

## 2024-02-04 ENCOUNTER — Other Ambulatory Visit: Payer: Self-pay

## 2024-02-04 ENCOUNTER — Other Ambulatory Visit (HOSPITAL_BASED_OUTPATIENT_CLINIC_OR_DEPARTMENT_OTHER): Payer: Self-pay

## 2024-02-04 MED ORDER — GABAPENTIN 300 MG PO CAPS
300.0000 mg | ORAL_CAPSULE | Freq: Every day | ORAL | 0 refills | Status: DC
  Filled 2024-02-04 – 2024-02-13 (×2): qty 30, 30d supply, fill #0

## 2024-02-04 MED ORDER — LEVOTHYROXINE SODIUM 100 MCG PO TABS
100.0000 ug | ORAL_TABLET | Freq: Every day | ORAL | 0 refills | Status: DC
  Filled 2024-02-04: qty 90, 90d supply, fill #0

## 2024-02-05 ENCOUNTER — Other Ambulatory Visit: Payer: Self-pay

## 2024-02-13 ENCOUNTER — Other Ambulatory Visit: Payer: Self-pay | Admitting: Family Medicine

## 2024-02-13 ENCOUNTER — Other Ambulatory Visit (HOSPITAL_BASED_OUTPATIENT_CLINIC_OR_DEPARTMENT_OTHER): Payer: Self-pay

## 2024-02-13 ENCOUNTER — Other Ambulatory Visit: Payer: Self-pay

## 2024-02-23 ENCOUNTER — Other Ambulatory Visit (HOSPITAL_BASED_OUTPATIENT_CLINIC_OR_DEPARTMENT_OTHER): Payer: Self-pay

## 2024-02-23 ENCOUNTER — Other Ambulatory Visit: Payer: Self-pay | Admitting: Family Medicine

## 2024-02-23 MED ORDER — DICLOFENAC SODIUM 75 MG PO TBEC
75.0000 mg | DELAYED_RELEASE_TABLET | Freq: Two times a day (BID) | ORAL | 2 refills | Status: DC
  Filled 2024-02-23: qty 60, 30d supply, fill #0

## 2024-03-11 ENCOUNTER — Other Ambulatory Visit: Payer: Self-pay | Admitting: Family Medicine

## 2024-03-12 ENCOUNTER — Encounter (HOSPITAL_BASED_OUTPATIENT_CLINIC_OR_DEPARTMENT_OTHER): Payer: Self-pay

## 2024-03-12 ENCOUNTER — Other Ambulatory Visit (HOSPITAL_BASED_OUTPATIENT_CLINIC_OR_DEPARTMENT_OTHER): Payer: Self-pay

## 2024-03-12 MED ORDER — OMEPRAZOLE 40 MG PO CPDR
40.0000 mg | DELAYED_RELEASE_CAPSULE | Freq: Every day | ORAL | 0 refills | Status: DC
  Filled 2024-03-12: qty 30, 30d supply, fill #0

## 2024-03-12 MED ORDER — VENLAFAXINE HCL ER 150 MG PO CP24
150.0000 mg | ORAL_CAPSULE | Freq: Every day | ORAL | 0 refills | Status: DC
  Filled 2024-03-12: qty 30, 30d supply, fill #0

## 2024-03-12 MED ORDER — ATORVASTATIN CALCIUM 20 MG PO TABS
20.0000 mg | ORAL_TABLET | Freq: Every day | ORAL | 0 refills | Status: DC
  Filled 2024-03-12: qty 30, 30d supply, fill #0

## 2024-03-12 NOTE — Telephone Encounter (Signed)
 Will refill after appt next week

## 2024-03-18 ENCOUNTER — Ambulatory Visit (INDEPENDENT_AMBULATORY_CARE_PROVIDER_SITE_OTHER): Admitting: Family Medicine

## 2024-03-18 ENCOUNTER — Other Ambulatory Visit (HOSPITAL_BASED_OUTPATIENT_CLINIC_OR_DEPARTMENT_OTHER): Payer: Self-pay

## 2024-03-18 ENCOUNTER — Encounter: Payer: Self-pay | Admitting: Family Medicine

## 2024-03-18 ENCOUNTER — Other Ambulatory Visit: Payer: Self-pay

## 2024-03-18 ENCOUNTER — Other Ambulatory Visit: Payer: Self-pay | Admitting: Family Medicine

## 2024-03-18 VITALS — BP 118/78 | HR 77 | Temp 98.2°F | Ht 65.0 in | Wt 164.6 lb

## 2024-03-18 DIAGNOSIS — E663 Overweight: Secondary | ICD-10-CM

## 2024-03-18 DIAGNOSIS — R232 Flushing: Secondary | ICD-10-CM

## 2024-03-18 DIAGNOSIS — R87612 Low grade squamous intraepithelial lesion on cytologic smear of cervix (LGSIL): Secondary | ICD-10-CM | POA: Diagnosis not present

## 2024-03-18 DIAGNOSIS — M1990 Unspecified osteoarthritis, unspecified site: Secondary | ICD-10-CM

## 2024-03-18 DIAGNOSIS — Z79899 Other long term (current) drug therapy: Secondary | ICD-10-CM

## 2024-03-18 DIAGNOSIS — R7309 Other abnormal glucose: Secondary | ICD-10-CM

## 2024-03-18 DIAGNOSIS — Z0000 Encounter for general adult medical examination without abnormal findings: Secondary | ICD-10-CM | POA: Diagnosis not present

## 2024-03-18 DIAGNOSIS — Z1231 Encounter for screening mammogram for malignant neoplasm of breast: Secondary | ICD-10-CM

## 2024-03-18 DIAGNOSIS — E559 Vitamin D deficiency, unspecified: Secondary | ICD-10-CM | POA: Diagnosis not present

## 2024-03-18 DIAGNOSIS — K635 Polyp of colon: Secondary | ICD-10-CM | POA: Diagnosis not present

## 2024-03-18 DIAGNOSIS — E782 Mixed hyperlipidemia: Secondary | ICD-10-CM

## 2024-03-18 DIAGNOSIS — E063 Autoimmune thyroiditis: Secondary | ICD-10-CM | POA: Diagnosis not present

## 2024-03-18 DIAGNOSIS — E039 Hypothyroidism, unspecified: Secondary | ICD-10-CM

## 2024-03-18 LAB — CBC
HCT: 37.3 % (ref 36.0–46.0)
Hemoglobin: 12 g/dL (ref 12.0–15.0)
MCHC: 32.1 g/dL (ref 30.0–36.0)
MCV: 87.9 fl (ref 78.0–100.0)
Platelets: 382 10*3/uL (ref 150.0–400.0)
RBC: 4.24 Mil/uL (ref 3.87–5.11)
RDW: 14.3 % (ref 11.5–15.5)
WBC: 7.1 10*3/uL (ref 4.0–10.5)

## 2024-03-18 LAB — LIPID PANEL
Cholesterol: 175 mg/dL (ref 0–200)
HDL: 67.6 mg/dL (ref >39.00)
LDL Cholesterol: 93 mg/dL (ref 0–99)
NonHDL: 107.73
Total CHOL/HDL Ratio: 3
Triglycerides: 73 mg/dL (ref 0.0–149.0)
VLDL: 14.6 mg/dL (ref 0.0–40.0)

## 2024-03-18 LAB — COMPREHENSIVE METABOLIC PANEL WITH GFR
ALT: 20 U/L (ref 0–35)
AST: 25 U/L (ref 0–37)
Albumin: 4.1 g/dL (ref 3.5–5.2)
Alkaline Phosphatase: 73 U/L (ref 39–117)
BUN: 18 mg/dL (ref 6–23)
CO2: 28 meq/L (ref 19–32)
Calcium: 9.1 mg/dL (ref 8.4–10.5)
Chloride: 105 meq/L (ref 96–112)
Creatinine, Ser: 0.73 mg/dL (ref 0.40–1.20)
GFR: 88.94 mL/min (ref >60.00)
Glucose, Bld: 94 mg/dL (ref 70–99)
Potassium: 4.6 meq/L (ref 3.5–5.1)
Sodium: 141 meq/L (ref 135–145)
Total Bilirubin: 0.3 mg/dL (ref 0.2–1.2)
Total Protein: 6.8 g/dL (ref 6.0–8.3)

## 2024-03-18 LAB — TSH: TSH: 1.44 u[IU]/mL (ref 0.35–5.50)

## 2024-03-18 LAB — HEMOGLOBIN A1C: Hgb A1c MFr Bld: 6.1 % (ref 4.6–6.5)

## 2024-03-18 LAB — VITAMIN D 25 HYDROXY (VIT D DEFICIENCY, FRACTURES): VITD: 25.26 ng/mL — ABNORMAL LOW (ref 30.00–100.00)

## 2024-03-18 MED ORDER — VENLAFAXINE HCL ER 150 MG PO CP24
150.0000 mg | ORAL_CAPSULE | Freq: Every day | ORAL | 1 refills | Status: DC
  Filled 2024-03-18 – 2024-04-19 (×2): qty 90, 90d supply, fill #0
  Filled 2024-07-15: qty 90, 90d supply, fill #1

## 2024-03-18 MED ORDER — DICLOFENAC SODIUM 75 MG PO TBEC
75.0000 mg | DELAYED_RELEASE_TABLET | Freq: Two times a day (BID) | ORAL | 1 refills | Status: DC
  Filled 2024-03-18: qty 180, 90d supply, fill #0
  Filled 2024-07-15: qty 180, 90d supply, fill #1

## 2024-03-18 MED ORDER — LEVOTHYROXINE SODIUM 100 MCG PO TABS
100.0000 ug | ORAL_TABLET | Freq: Every day | ORAL | 3 refills | Status: AC
  Filled 2024-03-18 – 2024-05-04 (×2): qty 90, 90d supply, fill #0
  Filled 2024-08-12: qty 90, 90d supply, fill #1
  Filled 2024-10-28: qty 90, 90d supply, fill #2

## 2024-03-18 MED ORDER — OMEPRAZOLE 40 MG PO CPDR
40.0000 mg | DELAYED_RELEASE_CAPSULE | Freq: Every day | ORAL | 1 refills | Status: DC
  Filled 2024-03-18 – 2024-04-19 (×2): qty 90, 90d supply, fill #0
  Filled 2024-07-15: qty 90, 90d supply, fill #1

## 2024-03-18 MED ORDER — ATORVASTATIN CALCIUM 20 MG PO TABS
20.0000 mg | ORAL_TABLET | Freq: Every day | ORAL | 3 refills | Status: AC
  Filled 2024-03-18 – 2024-04-19 (×2): qty 90, 90d supply, fill #0
  Filled 2024-07-15: qty 90, 90d supply, fill #1
  Filled 2024-10-28: qty 90, 90d supply, fill #2

## 2024-03-18 MED ORDER — GABAPENTIN 300 MG PO CAPS
300.0000 mg | ORAL_CAPSULE | Freq: Every day | ORAL | 1 refills | Status: DC
  Filled 2024-03-18: qty 90, 90d supply, fill #0
  Filled 2024-07-01: qty 90, 90d supply, fill #1

## 2024-03-18 NOTE — Patient Instructions (Addendum)
 Return in about 24 weeks (around 09/02/2024) for Routine chronic condition follow-up.        Great to see you today.  I have refilled the medication(s) we provide.   If labs were collected or images ordered, we will inform you of  results once we have received them and reviewed. We will contact you either by echart message, or telephone call.  Please give ample time to the testing facility, and our office to run,  receive and review results. Please do not call inquiring of results, even if you can see them in your chart. We will contact you as soon as we are able. If it has been over 1 week since the test was completed, and you have not yet heard from us , then please call us .    - echart message- for normal results that have been seen by the patient already.   - telephone call: abnormal results or if patient has not viewed results in their echart.  If a referral to a specialist was entered for you, please call us  in 2 weeks if you have not heard from the specialist office to schedule.

## 2024-03-18 NOTE — Progress Notes (Signed)
 Patient ID: Molly Park, female  DOB: 1960-11-27, 63 y.o.   MRN: [id number] Patient Care Team    Relationship Specialty Notifications Start End  Mariel Shope, DO PCP - General Family Medicine  01/14/17   Wanita Gutta, MD Consulting Physician Obstetrics and Gynecology  01/14/18   Kenney Peacemaker, MD Consulting Physician Gastroenterology  02/21/21     Chief Complaint  Patient presents with   Annual Exam    Chronic Conditions/illness Management.  Pt is fasting.     Subjective: Molly Park is a 63 y.o.  Female  present for CPE and Chronic Conditions/illness Management All past medical history, surgical history, allergies, family history, immunizations, medications and social history were updated in the electronic medical record today. All recent labs, ED visits and hospitalizations within the last year were reviewed.  Health maintenance:  Colonoscopy: no fhx, screen completed 01/21/2014 w/ polyps,  at [facility] specialist. She was told 5 year follow up. Now has scheduled with Dr. Willy Harvest. postponed bc of no records from digestive health> referred back to digestive health.  Mammogram: completed: [facility]08/2022,  [facility] Cervical cancer screening: last pap: 09/2022 results: Dr. Connell Degree  1 yr rpt> [facility]  Immunizations: tdap 01/2017 UTD. Influenza UTD (encouraged yearly). Shingrix  completed  Infectious disease: HIV & Hep C completed Patient has a Dental home. Hospitalizations/ED visits: Reviewed  Hypothyroidism, unspecified type Thyroid  levels have been stable.   Patient reports compliance with levothyroxine  100 mcg daily.   Hot flashes/focus Patient reports she has seen improvement in her focus and her hot flashes are stable.  She reports compliance with Effexor  150 mg daily.   Mixed hyperlipidemia She is compliant with  atorvastatin .    Lumbar pain/arthritis She reports she has continued diclofenac  once daily with food.   She denies any worsening of reflux symptoms.  Gabapentin  300 mg at bedtime.   GERD: Patient reports symptoms are well-controlled on omeprazole  40 mg daily.  Symptoms do return if she discontinues omeprazole .  Symptoms present prior to start of NSAIDs and have not worsened since.       10/13/2023   11:09 AM 06/11/2023   10:08 AM 03/03/2023    1:27 PM 02/22/2022    8:08 AM 09/17/2021    9:39 AM  Depression screen PHQ 2/9  Decreased Interest 0 0 0 0 0  Down, Depressed, Hopeless 0 0 0 0 0  PHQ - 2 Score 0 0 0 0 0      10/13/2023   11:09 AM 06/11/2023   10:09 AM 01/20/2019    8:55 AM 10/27/2018    3:59 PM  GAD 7 : Generalized Anxiety Score  Nervous, Anxious, on Edge 0 0 0 1  Control/stop worrying 0 0 0 1  Worry too much - different things 0 0 0 1  Trouble relaxing 0 0 0 1  Restless 0 0 0 1  Easily annoyed or irritable 0 0 0 0  Afraid - awful might happen 0 0 0 0  Total GAD 7 Score 0 0 0 5  Anxiety Difficulty Not difficult at all Not difficult at all Not difficult at all Not difficult at all     Immunization History  Administered Date(s) Administered   Hepatitis A, Ped/Adol-2 Dose 12/07/2012   Influenza, Seasonal, Injecte, Preservative Fre 09/15/2014   Influenza,inj,Quad PF,6+ Mos 08/11/2022, 08/23/2023   Influenza-Unspecified 09/15/2014, 08/11/2016, 09/15/2018, 08/11/2020, 08/11/2021   PFIZER(Purple Top)SARS-COV-2 Vaccination 11/16/2019, 12/06/2019   Tdap 01/14/2017  Zoster Recombinant(Shingrix ) 01/20/2019, 02/22/2022    Past Medical History:  Diagnosis Date   Bilateral edema of lower extremity    tried on low dose lasix by prior provider   Chicken pox    Closed fracture of lower end of right radius with routine healing 08/18/2019   Dysphagia 07/22/2018   Hemorrhoid    History of COVID-19 02/15/2021   HSV infection    Hypothyroidism    Nonallopathic lesion of cervical region 08/24/2020   PONV (postoperative nausea and vomiting)    Rotator cuff impingement syndrome of  right shoulder    Sprain of sacroiliac ligament 10/22/2021   No Known Allergies Past Surgical History:  Procedure Laterality Date   APPENDECTOMY  1973   BREAST EXCISIONAL BIOPSY Right 2014   EXAM UNDER ANESTHESIA WITH MANIPULATION OF SHOULDER Right 01/17/2020   Procedure: Right shoulder manipulation under anesthesia;  Surgeon: Sammye Cristal, MD;  Location: [facility];  Service: Orthopedics;  Laterality: Right;   HEMORRHOID SURGERY     OPEN REDUCTION INTERNAL FIXATION (ORIF) DISTAL RADIAL FRACTURE Right 08/12/2019   Procedure: OPEN REDUCTION INTERNAL FIXATION (ORIF) DISTAL RADIAL FRACTURE;  Surgeon: Brunilda Capra, MD;  Location:  SURGERY CENTER;  Service: Orthopedics;  Laterality: Right;   SHOULDER INJECTION Right 01/17/2020   Procedure: SHOULDER STEROID INJECTION;  Surgeon: Sammye Cristal, MD;  Location:  SURGERY CENTER;  Service: Orthopedics;  Laterality: Right;   TUBAL LIGATION  2004   Family History  Problem Relation Age of Onset   Diabetes Mother    Valvular heart disease Father    Breast cancer Maternal Aunt        46   Social History   Social History Narrative   Patient is married to [place]. They have 5 children btw them.   BA degree. 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   Drinks caffeine.   Wears a seatbelt, wears a bicycle helmet, smoke detector in the home.   Firearms in the home.   Exercises routinely.   Feels safe in her relationships.    Allergies as of 03/18/2024   No Known Allergies      Medication List        Accurate as of Mar 18, 2024  8:30 AM. If you have any questions, ask your nurse or doctor.          STOP taking these medications    baclofen  10 MG tablet Commonly known as: LIORESAL  Stopped by: Napolean Backbone   predniSONE  10 MG tablet Commonly known as: DELTASONE  Stopped by: Wilhemina Grall   SM Lorata-dine D 10-240 MG 24 hr tablet Generic drug:  loratadine -pseudoephedrine  Stopped by: Napolean Backbone   traMADol  50 MG tablet Commonly known as: ULTRAM  Stopped by: Napolean Backbone   triamcinolone  cream 0.1 % Commonly known as: KENALOG  Stopped by: Napolean Backbone       TAKE these medications    atorvastatin  20 MG tablet Commonly known as: LIPITOR Take 1 tablet (20 mg total) by mouth daily.   diclofenac  75 MG EC tablet Commonly known as: VOLTAREN  Take 1 tablet (75 mg total) by mouth 2 (two) times daily.   gabapentin  300 MG capsule Commonly known as: NEURONTIN  Take 1 capsule (300 mg total) by mouth at bedtime.   levothyroxine  100 MCG tablet Commonly known as: Synthroid  Take 1 tablet (100 mcg total) by mouth daily.   omeprazole  40 MG capsule Commonly known as: PRILOSEC Take 1 capsule (40 mg total) by mouth daily.  venlafaxine  XR 150 MG 24 hr capsule Commonly known as: Effexor  XR Take 1 capsule (150 mg total) by mouth daily with breakfast.   Vitamin D  50 MCG (2000 UT) tablet Take 2,000 Units by mouth daily.        All past medical history, surgical history, allergies, family history, immunizations andmedications were updated in the EMR today and reviewed under the history and medication portions of their EMR.     No results found for this or any previous visit (from the past 2160 hours).    ROS 14 pt review of systems performed and negative (unless mentioned in an HPI) Objective: BP 118/78   Pulse 77   Temp 98.2 F (36.8 C)   Ht 5\' 5"  (1.651 m)   Wt 164 lb 9.6 oz (74.7 kg)   LMP  (LMP Unknown)   BMI 27.39 kg/m  Physical Exam Vitals and nursing note reviewed.  Constitutional:      General: She is not in acute distress.    Appearance: Normal appearance. She is not ill-appearing or toxic-appearing.  HENT:     Head: Normocephalic and atraumatic.     Right Ear: Tympanic membrane, ear canal and external ear normal. There is no impacted cerumen.     Left Ear: Tympanic membrane, ear canal and external ear  normal. There is no impacted cerumen.     Nose: No congestion or rhinorrhea.     Mouth/Throat:     Mouth: Mucous membranes are moist.     Pharynx: Oropharynx is clear. No oropharyngeal exudate or posterior oropharyngeal erythema.  Eyes:     General: No scleral icterus.       Right eye: No discharge.        Left eye: No discharge.     Extraocular Movements: Extraocular movements intact.     Conjunctiva/sclera: Conjunctivae normal.     Pupils: Pupils are equal, round, and reactive to light.  Cardiovascular:     Rate and Rhythm: Normal rate and regular rhythm.     Pulses: Normal pulses.     Heart sounds: Normal heart sounds. No murmur heard.    No friction rub. No gallop.  Pulmonary:     Effort: Pulmonary effort is normal. No respiratory distress.     Breath sounds: Normal breath sounds. No stridor. No wheezing, rhonchi or rales.  Chest:     Chest wall: No tenderness.  Abdominal:     General: Abdomen is flat. Bowel sounds are normal. There is no distension.     Palpations: Abdomen is soft. There is no mass.     Tenderness: There is no abdominal tenderness. There is no right CVA tenderness, left CVA tenderness, guarding or rebound.     Hernia: No hernia is present.  Musculoskeletal:        General: No swelling, tenderness or deformity. Normal range of motion.     Cervical back: Normal range of motion and neck supple. No rigidity or tenderness.     Right lower leg: No edema.     Left lower leg: No edema.  Lymphadenopathy:     Cervical: No cervical adenopathy.  Skin:    General: Skin is warm and dry.     Coloration: Skin is not jaundiced or pale.     Findings: No bruising, erythema, lesion or rash.  Neurological:     General: No focal deficit present.     Mental Status: She is alert and oriented to person, place, and time. Mental status is at  baseline.     Cranial Nerves: No cranial nerve deficit.     Sensory: No sensory deficit.     Motor: No weakness.     Coordination:  Coordination normal.     Gait: Gait normal.     Deep Tendon Reflexes: Reflexes normal.  Psychiatric:        Mood and Affect: Mood normal.        Behavior: Behavior normal.        Thought Content: Thought content normal.        Judgment: Judgment normal.     Assessment/plan: Nelissa Oehlert is a 63 y.o. female present for CPE and routine chronic conditions Hypothyroidism due to acquired atrophy of thyroid  Stable Continue evo 100 mcg, if dose needs altered will do so on refills after results received - TSH collected today  Mixed hyperlipidemia/On statin therapy/Overweight (BMI 25.0-29.9) Stable Continue atorvastatin  Lipids collected today  Vitamin D  deficiency - VITAMIN D  25 collected today  Hot flashes Stable  continue  effexor  150 qd  Arthritis/lumbar pain Stable Continue  diclofenac  QD (encouraged her to look into goodrx- may need to use short acting)  Gastroesophageal reflux disease without esophagitis Stable Continue omeprazole  every day  Colon polyp Referred back to GI (digestive health)  LGSIL Referred back to gyn  Encounter for preventive health examination Patient was encouraged to exercise greater than 150 minutes a week. Patient was encouraged to choose a diet filled with fresh fruits and vegetables, and lean meats. AVS provided to patient today for education/recommendation on gender specific health and safety maintenance. Colonoscopy: no fhx, screen completed 01/21/2014 w/ polyps,  at [facility] specialist. She was told 5 year follow up. Now has scheduled with Dr. Willy Harvest. postponed bc of no records from digestive health> referred back to digestive health.  Mammogram: completed: 03/08/2022,  [facility] Cervical cancer screening: last pap: 09/2022 results: Dr. Connell Degree  1 yr rpt> [facility]  Immunizations: tdap 01/2017 UTD. Influenza UTD (encouraged yearly). Shingrix  completed  Infectious disease: HIV & Hep C  completed  Return in about 24 weeks (around 09/02/2024) for Routine chronic condition follow-up.  Orders Placed This Encounter  Procedures   MM 3D SCREENING MAMMOGRAM BILATERAL BREAST   CBC   Comprehensive metabolic panel with GFR   Hemoglobin A1c   Lipid panel   TSH   Vitamin D  (25 hydroxy)   Ambulatory referral to Gastroenterology   Ambulatory referral to Gynecology   Meds ordered this encounter  Medications   venlafaxine  XR (EFFEXOR  XR) 150 MG 24 hr capsule    Sig: Take 1 capsule (150 mg total) by mouth daily with breakfast.    Dispense:  90 capsule    Refill:  1   omeprazole  (PRILOSEC) 40 MG capsule    Sig: Take 1 capsule (40 mg total) by mouth daily.    Dispense:  90 capsule    Refill:  1   gabapentin  (NEURONTIN ) 300 MG capsule    Sig: Take 1 capsule (300 mg total) by mouth at bedtime.    Dispense:  90 capsule    Refill:  1   diclofenac  (VOLTAREN ) 75 MG EC tablet    Sig: Take 1 tablet (75 mg total) by mouth 2 (two) times daily.    Dispense:  180 tablet    Refill:  1   atorvastatin  (LIPITOR) 20 MG tablet    Sig: Take 1 tablet (20 mg total) by mouth daily.    Dispense:  90 tablet  Refill:  3   Referral Orders         Ambulatory referral to Gastroenterology         Ambulatory referral to Gynecology        Electronically signed by: Napolean Backbone, DO [facility]

## 2024-04-19 ENCOUNTER — Other Ambulatory Visit (HOSPITAL_BASED_OUTPATIENT_CLINIC_OR_DEPARTMENT_OTHER): Payer: Self-pay

## 2024-04-21 ENCOUNTER — Ambulatory Visit

## 2024-04-21 DIAGNOSIS — Z1231 Encounter for screening mammogram for malignant neoplasm of breast: Secondary | ICD-10-CM | POA: Diagnosis not present

## 2024-04-23 ENCOUNTER — Ambulatory Visit: Payer: Self-pay | Admitting: Family Medicine

## 2024-05-04 ENCOUNTER — Other Ambulatory Visit (HOSPITAL_BASED_OUTPATIENT_CLINIC_OR_DEPARTMENT_OTHER): Payer: Self-pay

## 2024-05-13 ENCOUNTER — Other Ambulatory Visit (HOSPITAL_BASED_OUTPATIENT_CLINIC_OR_DEPARTMENT_OTHER): Payer: Self-pay

## 2024-05-13 MED ORDER — NA SULFATE-K SULFATE-MG SULF 17.5-3.13-1.6 GM/177ML PO SOLN
ORAL | 0 refills | Status: AC
  Filled 2024-05-13 – 2024-05-24 (×2): qty 354, 1d supply, fill #0

## 2024-05-24 ENCOUNTER — Other Ambulatory Visit (HOSPITAL_BASED_OUTPATIENT_CLINIC_OR_DEPARTMENT_OTHER): Payer: Self-pay

## 2024-07-01 ENCOUNTER — Other Ambulatory Visit (HOSPITAL_BASED_OUTPATIENT_CLINIC_OR_DEPARTMENT_OTHER): Payer: Self-pay

## 2024-07-15 ENCOUNTER — Other Ambulatory Visit (HOSPITAL_BASED_OUTPATIENT_CLINIC_OR_DEPARTMENT_OTHER): Payer: Self-pay

## 2024-07-26 ENCOUNTER — Ambulatory Visit: Payer: Self-pay | Admitting: Family Medicine

## 2024-07-26 NOTE — Progress Notes (Deleted)
 OFFICE VISIT  07/26/2024  CC: No chief complaint on file.   Patient is a 63 y.o. female who presents for back pain.  HPI: ***  Lumbar spine x-rays 12-24: IMPRESSION: 1. Mild degenerative disc disease at L4-L5. Moderate facet hypertrophy at L4-L5 and L5-S1. 2. Trace anterolisthesis of L4 on L5. 3. Trace inferior spurring of the left sacroiliac joint.   Past Medical History:  Diagnosis Date   Bilateral edema of lower extremity    tried on low dose lasix by prior provider   Chicken pox    Closed fracture of lower end of right radius with routine healing 08/18/2019   Dysphagia 07/22/2018   Hemorrhoid    History of COVID-19 02/15/2021   HSV infection    Hypothyroidism    Nonallopathic lesion of cervical region 08/24/2020   PONV (postoperative nausea and vomiting)    Rotator cuff impingement syndrome of right shoulder    Sprain of sacroiliac ligament 10/22/2021    Past Surgical History:  Procedure Laterality Date   APPENDECTOMY  1973   BREAST EXCISIONAL BIOPSY Right 2014   EXAM UNDER ANESTHESIA WITH MANIPULATION OF SHOULDER Right 01/17/2020   Procedure: Right shoulder manipulation under anesthesia;  Surgeon: Dozier Soulier, MD;  Location: [facility];  Service: Orthopedics;  Laterality: Right;   HEMORRHOID SURGERY     OPEN REDUCTION INTERNAL FIXATION (ORIF) DISTAL RADIAL FRACTURE Right 08/12/2019   Procedure: OPEN REDUCTION INTERNAL FIXATION (ORIF) DISTAL RADIAL FRACTURE;  Surgeon: Murrell Drivers, MD;  Location: [facility];  Service: Orthopedics;  Laterality: Right;   SHOULDER INJECTION Right 01/17/2020   Procedure: SHOULDER STEROID INJECTION;  Surgeon: Dozier Soulier, MD;  Location: [facility];  Service: Orthopedics;  Laterality: Right;   TUBAL LIGATION  2004    Outpatient Medications Prior to Visit  Medication Sig Dispense Refill   atorvastatin  (LIPITOR) 20 MG tablet Take 1 tablet (20 mg total) by mouth daily. 90 tablet 3    Cholecalciferol (VITAMIN D ) 50 MCG (2000 UT) tablet Take 2,000 Units by mouth daily.     diclofenac  (VOLTAREN ) 75 MG EC tablet Take 1 tablet (75 mg total) by mouth 2 (two) times daily. 180 tablet 1   gabapentin  (NEURONTIN ) 300 MG capsule Take 1 capsule (300 mg total) by mouth at bedtime. 90 capsule 1   levothyroxine  (SYNTHROID ) 100 MCG tablet Take 1 tablet (100 mcg total) by mouth daily. 90 tablet 3   Na Sulfate-K Sulfate-Mg Sulfate concentrate (SUPREP) 17.5-3.13-1.6 GM/177ML SOLN Take 177 mLs by mouth 2 (two) times. Please refer to prep instructions sent by [facility] 354 mL 0   omeprazole  (PRILOSEC) 40 MG capsule Take 1 capsule (40 mg total) by mouth daily. 90 capsule 1   venlafaxine  XR (EFFEXOR  XR) 150 MG 24 hr capsule Take 1 capsule (150 mg total) by mouth daily with breakfast. 90 capsule 1   No facility-administered medications prior to visit.    No Known Allergies  Review of Systems  As per HPI  PE:    03/18/2024    8:05 AM 10/13/2023   11:02 AM 06/11/2023   10:10 AM  Vitals with BMI  Height 5' 5 5' 4 5' 4  Weight 164 lbs 10 oz 163 lbs 3 oz 163 lbs 3 oz  BMI 27.39 28 28  Systolic 118 126 899  Diastolic 78 74 60  Pulse 77 84 90     Physical Exam  ***  LABS:  Last CBC Lab Results  Component Value Date  WBC 7.1 03/18/2024   HGB 12.0 03/18/2024   HCT 37.3 03/18/2024   MCV 87.9 03/18/2024   MCH 28.1 02/13/2021   RDW 14.3 03/18/2024   PLT 382.0 03/18/2024   Last metabolic panel Lab Results  Component Value Date   GLUCOSE 94 03/18/2024   NA 141 03/18/2024   K 4.6 03/18/2024   CL 105 03/18/2024   CO2 28 03/18/2024   BUN 18 03/18/2024   CREATININE 0.73 03/18/2024   GFR 88.94 03/18/2024   CALCIUM  9.1 03/18/2024   PROT 6.8 03/18/2024   ALBUMIN 4.1 03/18/2024   BILITOT 0.3 03/18/2024   ALKPHOS 73 03/18/2024   AST 25 03/18/2024   ALT 20 03/18/2024   Last lipids Lab Results  Component Value Date   CHOL 175 03/18/2024   HDL 67.60 03/18/2024   LDLCALC 93  03/18/2024   TRIG 73.0 03/18/2024   CHOLHDL 3 03/18/2024   Last hemoglobin A1c Lab Results  Component Value Date   HGBA1C 6.1 03/18/2024   Last thyroid  functions Lab Results  Component Value Date   TSH 1.44 03/18/2024   Last vitamin D  Lab Results  Component Value Date   VD25OH 25.26 (L) 03/18/2024   Last vitamin B12 and Folate Lab Results  Component Value Date   VITAMINB12 430 02/13/2021   IMPRESSION AND PLAN:  No problem-specific Assessment & Plan notes found for this encounter.   An After Visit Summary was printed and given to the patient.  FOLLOW UP: No follow-ups on file.  Signed:  Gerlene Hockey, MD           07/26/2024

## 2024-07-28 NOTE — Progress Notes (Unsigned)
 Molly Jackson D[facility] [address] [place] [address] Phone: [phone]   Assessment and Plan:     1. Chronic bilateral low back pain without sciatica (Primary) 2. Degeneration of intervertebral disc of lumbar region with discogenic back pain 3. Lumbar facet arthropathy 4. Somatic dysfunction of lumbar region 5. Somatic dysfunction of pelvic region 6. Somatic dysfunction of sacral region -Chronic with exacerbation, subsequent sports medicine visit - Chronic low back pain, worse on right compared to left without radicular symptoms, consistent with flare of facet arthrosis as seen on lumbar MRI from 2019 - Start HEP for low back - Start meloxicam  15 mg daily x2 weeks.  If still having pain after 2 weeks, complete 3rd-week of NSAID. May use remaining NSAID as needed once daily for pain control.  Do not to use additional over-the-counter NSAIDs (ibuprofen, naproxen, Advil, Aleve, etc.) while taking prescription NSAIDs.  May use Tylenol  [phone] mg 2 to 3 times a day for breakthrough pain. - Patient has been using diclofenac  daily for years.  We discussed that I do not recommend daily long-term NSAID use due to potential long-term side effects.  For chronic pain relief, recommend using Tylenol  500 to 1000 mg tablets 2-3 times a day for day-to-day pain relief and using NSAIDs such as meloxicam  15 mg daily as needed for pain.  Recommend limiting chronic NSAIDs to 1-2 doses per week to prevent long-term side effects.  - Patient has received relief with OMT in the past.  Elects for repeat OMT today.  Tolerated well per note below. - Decision today to treat with OMT was based on Physical Exam  After verbal consent patient was treated with HVLA (high velocity low amplitude), ME (muscle energy), FPR (flex positional release), ST (soft tissue), PC/PD (Pelvic Compression/ Pelvic Decompression) techniques in sacrum, lumbar, and pelvic areas. Patient tolerated  the procedure well with improvement in symptoms.  Patient educated on potential side effects of soreness and recommended to rest, hydrate, and use Tylenol  as needed for pain control.   7. Bilateral primary osteoarthritis of knee 8. Chronic pain of both knees -Chronic with exacerbation, subsequent visit - Bilateral knee pain, most consistent with tricompartmental osteoarthritis, worse in patellofemoral compartment as seen on prior x-ray and MRI images from 2023 - Start HEP for knees - Start meloxicam  15 mg daily x2 weeks.  If still having pain after 2 weeks, complete 3rd-week of NSAID. May use remaining NSAID as needed once daily for pain control.  Do not to use additional over-the-counter NSAIDs (ibuprofen, naproxen, Advil, Aleve, etc.) while taking prescription NSAIDs.  May use Tylenol  [phone] mg 2 to 3 times a day for breakthrough pain. - Patient has been using diclofenac  daily for years.  We discussed that I do not recommend daily long-term NSAID use due to potential long-term side effects.  For chronic pain relief, recommend using Tylenol  500 to 1000 mg tablets 2-3 times a day for day-to-day pain relief and using NSAIDs such as meloxicam  15 mg daily as needed for pain.  Recommend limiting chronic NSAIDs to 1-2 doses per week to prevent long-term side effects.   15 additional minutes spent for educating Therapeutic Home Exercise Program.  This included exercises focusing on stretching, strengthening, with focus on eccentric aspects.   Long term goals include an improvement in range of motion, strength, endurance as well as avoiding reinjury. Patient's frequency would include in 1-2 times a day, 3-5 times a week for a duration of 6-12 weeks.  Proper technique shown and discussed handout in great detail with ATC.  All questions were discussed and answered.    Pertinent previous records reviewed include lumbar MRI 2019, knee x-rays and MRIs 2023   Follow Up: 4 weeks for reevaluation.  If no  improvement or worsening of symptoms, could consider knee CSI.  Could consider repeat OMT.  Could consider physical therapy   Subjective:   I, [name], am serving as a Neurosurgeon for Doctor Morene Mace  Chief Complaint: back and knee pain   HPI:   07/29/2024 Patient is a 63 year old female with back and knee pain. Patient states bilateral knee creptitus. Pain and no swelling  Low back pain had an MRI still has flares of back pain. Pain is now constant thinks an adjutstment might help. Thinks she has a fatty wall in her back    Relevant Historical Information: GERD  Additional pertinent review of systems negative.   Current Outpatient Medications:    meloxicam  (MOBIC ) 15 MG tablet, Take 1 tablet daily for 2 weeks.  If still in pain after 2 weeks, take 1 tablet daily for an additional 1 week., Disp: 30 tablet, Rfl: 0   atorvastatin  (LIPITOR) 20 MG tablet, Take 1 tablet (20 mg total) by mouth daily., Disp: 90 tablet, Rfl: 3   Cholecalciferol (VITAMIN D ) 50 MCG (2000 UT) tablet, Take 2,000 Units by mouth daily., Disp: , Rfl:    diclofenac  (VOLTAREN ) 75 MG EC tablet, Take 1 tablet (75 mg total) by mouth 2 (two) times daily., Disp: 180 tablet, Rfl: 1   gabapentin  (NEURONTIN ) 300 MG capsule, Take 1 capsule (300 mg total) by mouth at bedtime., Disp: 90 capsule, Rfl: 1   levothyroxine  (SYNTHROID ) 100 MCG tablet, Take 1 tablet (100 mcg total) by mouth daily., Disp: 90 tablet, Rfl: 3   Na Sulfate-K Sulfate-Mg Sulfate concentrate (SUPREP) 17.5-3.13-1.6 GM/177ML SOLN, Take 177 mLs by mouth 2 (two) times. Please refer to prep instructions sent by [facility], Disp: 354 mL, Rfl: 0   omeprazole  (PRILOSEC) 40 MG capsule, Take 1 capsule (40 mg total) by mouth daily., Disp: 90 capsule, Rfl: 1   venlafaxine  XR (EFFEXOR  XR) 150 MG 24 hr capsule, Take 1 capsule (150 mg total) by mouth daily with breakfast., Disp: 90 capsule, Rfl: 1   Objective:     Vitals:   07/29/24 1245  Pulse: 87  SpO2: 96%   Weight: 167 lb (75.8 kg)  Height: 5' 5 (1.651 m)      Body mass index is 27.79 kg/m.    Physical Exam:    General:  awake, alert oriented, no acute distress nontoxic Skin: no suspicious lesions or rashes Neuro:sensation intact and strength 5/5 with no deficits, no atrophy, normal muscle tone Psych: No signs of anxiety, depression or other mood disorder  Bilateral knee: Crepitus present No swelling No deformity Neg fluid wave, joint milking ROM Flex 110, Ext 0 TTP patella, patellar tendon, medial lateral joint line, medial lateral femoral condyle NTTP over the quad tendon,  tibial tuberostiy, fibular head, posterior fossa, pes anserine bursa, gerdy's tubercle,   Neg anterior and posterior drawer Neg lachman Neg sag sign Negative varus stress Negative valgus stress Negative McMurray Negative Thessaly  Gait normal     OMT Physical Exam:  ASIS Compression Test: Positive Right Sacrum: Positive sphinx, TTP right sacral base Lumbar: TTP paraspinal, L2 RRSR Pelvis: Right anterior innominate   Electronically signed by:  Odis Mace [facility] 1:19 PM 07/29/24

## 2024-07-29 ENCOUNTER — Ambulatory Visit (INDEPENDENT_AMBULATORY_CARE_PROVIDER_SITE_OTHER): Payer: PRIVATE HEALTH INSURANCE | Admitting: Sports Medicine

## 2024-07-29 ENCOUNTER — Other Ambulatory Visit (HOSPITAL_BASED_OUTPATIENT_CLINIC_OR_DEPARTMENT_OTHER): Payer: Self-pay

## 2024-07-29 VITALS — HR 87 | Ht 65.0 in | Wt 167.0 lb

## 2024-07-29 DIAGNOSIS — M25561 Pain in right knee: Secondary | ICD-10-CM

## 2024-07-29 DIAGNOSIS — M9903 Segmental and somatic dysfunction of lumbar region: Secondary | ICD-10-CM | POA: Diagnosis not present

## 2024-07-29 DIAGNOSIS — M9904 Segmental and somatic dysfunction of sacral region: Secondary | ICD-10-CM | POA: Diagnosis not present

## 2024-07-29 DIAGNOSIS — M5136 Other intervertebral disc degeneration, lumbar region with discogenic back pain only: Secondary | ICD-10-CM

## 2024-07-29 DIAGNOSIS — G8929 Other chronic pain: Secondary | ICD-10-CM

## 2024-07-29 DIAGNOSIS — M25562 Pain in left knee: Secondary | ICD-10-CM

## 2024-07-29 DIAGNOSIS — M170 Bilateral primary osteoarthritis of knee: Secondary | ICD-10-CM

## 2024-07-29 DIAGNOSIS — M9905 Segmental and somatic dysfunction of pelvic region: Secondary | ICD-10-CM

## 2024-07-29 DIAGNOSIS — M47816 Spondylosis without myelopathy or radiculopathy, lumbar region: Secondary | ICD-10-CM

## 2024-07-29 MED ORDER — MELOXICAM 15 MG PO TABS
ORAL_TABLET | ORAL | 0 refills | Status: DC
  Filled 2024-07-29: qty 30, 30d supply, fill #0

## 2024-07-29 NOTE — Patient Instructions (Addendum)
-   Start meloxicam  15 mg daily x2 weeks.  If still having pain after 2 weeks, complete 3rd-week of NSAID. May use remaining NSAID as needed once daily for pain control.  Do not to use additional over-the-counter NSAIDs (ibuprofen, naproxen, Advil, Aleve, etc.) while taking prescription NSAIDs.  May use Tylenol  [phone] mg 2 to 3 times a day for breakthrough pain.  Discontinue  diclofenac   Neck HEP   For chronic pain - - Use meloxicam  15 mg daily as needed for pain.  Recommend limiting chronic NSAIDs to 1-2 doses per week to prevent long-term side effects. Do not to use additional over-the-counter NSAIDs (ibuprofen, naproxen, Advil, Aleve, etc.) while taking prescription NSAIDs.  May use Tylenol  [phone] mg 2 to 3 times a day for breakthrough pain.  4 week follow up

## 2024-08-25 NOTE — Progress Notes (Unsigned)
 [facility] [address] [place] [address] Phone: [phone]   Assessment and Plan:     1. Chronic bilateral low back pain without sciatica (Primary) 2. Degeneration of intervertebral disc of lumbar region with discogenic back pain 3. Lumbar facet arthropathy -Chronic with exacerbation, subsequent visit - Continued low back pain and tightness consistent with intermittent flares of facet arthrosis as seen on lumbar MRI from 2019 - Continue HEP for low back - Use meloxicam  15 mg daily as needed for breakthrough pain.  Recommend limiting chronic NSAIDs to 1-2 doses per week to prevent long-term side effects. Use Tylenol  500 to 1000 mg tablets 2-3 times a day as needed for day-to-day pain relief.    -No significant benefit with OMT, so OMT not repeated at today's visit - Offered physical therapy which patient declined due to scheduling constraints.  Patient believes she may be able to complete physical therapy in early 2026.  Can call back for PT referral when available  4. Bilateral primary osteoarthritis of knee 5. Chronic pain of both knees -Chronic with exacerbation, subsequent visit - Overall improvement in bilateral knee pain consistent with improving flare of tricompartmental osteoarthritis that improved with relative rest, HEP, meloxicam  course - Use meloxicam  15 mg daily as needed for breakthrough pain.  Recommend limiting chronic NSAIDs to 1-2 doses per week to prevent long-term side effects. Use Tylenol  500 to 1000 mg tablets 2-3 times a day as needed for day-to-day pain relief.    -Continue HEP  - Offered physical therapy which patient declined due to scheduling constraints.  Patient believes she may be able to complete physical therapy in early 2026.  Can call back for PT referral when available  Pertinent previous records reviewed include none   Follow Up: As needed if no improvement or worsening of symptoms.  Could  refer to physical therapy.  Could consider intra-articular knee CSI   Subjective:   I, Molly Park, am serving as a Neurosurgeon for Doctor Morene Mace   Chief Complaint: back and knee pain    HPI:    07/29/2024 Patient is a 63 year old female with back and knee pain. Patient states bilateral knee creptitus. Pain and no swelling   Low back pain had an MRI still has flares of back pain. Pain is now constant thinks an adjutstment might help. 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 Meloxicam  has helped   Relevant Historical Information: GERD  Additional pertinent review of systems negative.   Current Outpatient Medications:    atorvastatin  (LIPITOR) 20 MG tablet, Take 1 tablet (20 mg total) by mouth daily., Disp: 90 tablet, Rfl: 3   Cholecalciferol (VITAMIN D ) 50 MCG (2000 UT) tablet, Take 2,000 Units by mouth daily., Disp: , Rfl:    diclofenac  (VOLTAREN ) 75 MG EC tablet, Take 1 tablet (75 mg total) by mouth 2 (two) times daily., Disp: 180 tablet, Rfl: 1   gabapentin  (NEURONTIN ) 300 MG capsule, Take 1 capsule (300 mg total) by mouth at bedtime., Disp: 90 capsule, Rfl: 1   levothyroxine  (SYNTHROID ) 100 MCG tablet, Take 1 tablet (100 mcg total) by mouth daily., Disp: 90 tablet, Rfl: 3   meloxicam  (MOBIC ) 15 MG tablet, Take 1 tablet by mouth daily for 2 weeks.  If still in pain after 2 weeks, take 1 tablet daily for an additional 1 week., Disp: 30 tablet, Rfl: 0  Na Sulfate-K Sulfate-Mg Sulfate concentrate (SUPREP) 17.5-3.13-1.6 GM/177ML SOLN, Take 177 mLs by mouth 2 (two) times. Please refer to prep instructions sent by [facility], Disp: 354 mL, Rfl: 0   omeprazole  (PRILOSEC) 40 MG capsule, Take 1 capsule (40 mg total) by mouth daily., Disp: 90 capsule, Rfl: 1   venlafaxine  XR (EFFEXOR  XR) 150 MG 24 hr capsule, Take 1 capsule (150 mg total) by mouth daily with breakfast., Disp: 90 capsule, Rfl: 1   Objective:     Vitals:    08/26/24 1300  Pulse: 79  SpO2: 99%  Weight: 170 lb (77.1 kg)  Height: 5' 5 (1.651 m)      Body mass index is 28.29 kg/m.    Physical Exam:     General:  awake, alert oriented, no acute distress nontoxic Skin: no suspicious lesions or rashes Neuro:sensation intact and strength 5/5 with no deficits, no atrophy, normal muscle tone Psych: No signs of anxiety, depression or other mood disorder   Bilateral knee: Crepitus present No swelling No deformity Neg fluid wave, joint milking ROM Flex 110, Ext 0 TTP patella, patellar tendon, medial lateral joint line, medial lateral femoral condyle NTTP over the quad tendon,  tibial tuberostiy, fibular head, posterior fossa, pes anserine bursa, gerdy's tubercle,   Neg anterior and posterior drawer Neg lachman Neg sag sign Negative varus stress Negative valgus stress Negative McMurray Negative Thessaly   Gait normal    Electronically signed by:  Odis [facility] 1:24 PM 08/26/24

## 2024-08-26 ENCOUNTER — Ambulatory Visit: Payer: PRIVATE HEALTH INSURANCE | Admitting: Sports Medicine

## 2024-08-26 ENCOUNTER — Other Ambulatory Visit (HOSPITAL_BASED_OUTPATIENT_CLINIC_OR_DEPARTMENT_OTHER): Payer: Self-pay

## 2024-08-26 VITALS — HR 79 | Ht 65.0 in | Wt 170.0 lb

## 2024-08-26 DIAGNOSIS — M545 Low back pain, unspecified: Secondary | ICD-10-CM

## 2024-08-26 DIAGNOSIS — M5136 Other intervertebral disc degeneration, lumbar region with discogenic back pain only: Secondary | ICD-10-CM

## 2024-08-26 DIAGNOSIS — G8929 Other chronic pain: Secondary | ICD-10-CM

## 2024-08-26 DIAGNOSIS — M47816 Spondylosis without myelopathy or radiculopathy, lumbar region: Secondary | ICD-10-CM

## 2024-08-26 DIAGNOSIS — M25561 Pain in right knee: Secondary | ICD-10-CM

## 2024-08-26 DIAGNOSIS — M170 Bilateral primary osteoarthritis of knee: Secondary | ICD-10-CM

## 2024-08-26 DIAGNOSIS — M25562 Pain in left knee: Secondary | ICD-10-CM

## 2024-08-26 MED ORDER — MELOXICAM 15 MG PO TABS
15.0000 mg | ORAL_TABLET | Freq: Every day | ORAL | 0 refills | Status: AC
  Filled 2024-08-26: qty 30, 30d supply, fill #0

## 2024-08-26 NOTE — Patient Instructions (Signed)
-   Use meloxicam  15 mg daily as needed for breakthrough pain.  Recommend limiting chronic NSAIDs to 1-2 doses per week to prevent long-term side effects. Use Tylenol  500 to 1000 mg tablets 2-3 times a day as needed for day-to-day pain relief.    Refill meloxicam    Follow up as needed if you would like steroid injection or if you would like a PT referral

## 2024-09-02 ENCOUNTER — Encounter: Payer: Self-pay | Admitting: Family Medicine

## 2024-09-02 ENCOUNTER — Other Ambulatory Visit (HOSPITAL_BASED_OUTPATIENT_CLINIC_OR_DEPARTMENT_OTHER): Payer: Self-pay

## 2024-09-02 ENCOUNTER — Ambulatory Visit (INDEPENDENT_AMBULATORY_CARE_PROVIDER_SITE_OTHER): Payer: PRIVATE HEALTH INSURANCE | Admitting: Family Medicine

## 2024-09-02 VITALS — BP 124/78 | HR 81 | Temp 98.2°F | Wt 164.0 lb

## 2024-09-02 DIAGNOSIS — K219 Gastro-esophageal reflux disease without esophagitis: Secondary | ICD-10-CM

## 2024-09-02 DIAGNOSIS — M1990 Unspecified osteoarthritis, unspecified site: Secondary | ICD-10-CM

## 2024-09-02 DIAGNOSIS — Z79899 Other long term (current) drug therapy: Secondary | ICD-10-CM

## 2024-09-02 DIAGNOSIS — M545 Low back pain, unspecified: Secondary | ICD-10-CM

## 2024-09-02 DIAGNOSIS — E063 Autoimmune thyroiditis: Secondary | ICD-10-CM

## 2024-09-02 DIAGNOSIS — R7309 Other abnormal glucose: Secondary | ICD-10-CM | POA: Diagnosis not present

## 2024-09-02 DIAGNOSIS — E663 Overweight: Secondary | ICD-10-CM

## 2024-09-02 DIAGNOSIS — E782 Mixed hyperlipidemia: Secondary | ICD-10-CM | POA: Diagnosis not present

## 2024-09-02 DIAGNOSIS — E559 Vitamin D deficiency, unspecified: Secondary | ICD-10-CM

## 2024-09-02 DIAGNOSIS — R232 Flushing: Secondary | ICD-10-CM

## 2024-09-02 DIAGNOSIS — Z23 Encounter for immunization: Secondary | ICD-10-CM

## 2024-09-02 LAB — POCT GLYCOSYLATED HEMOGLOBIN (HGB A1C)
HbA1c POC (<> result, manual entry): 5.8 % (ref 4.0–5.6)
HbA1c, POC (controlled diabetic range): 5.8 % (ref 0.0–7.0)
HbA1c, POC (prediabetic range): 5.8 % (ref 5.7–6.4)
Hemoglobin A1C: 5.8 % — AB (ref 4.0–5.6)

## 2024-09-02 MED ORDER — GABAPENTIN 300 MG PO CAPS
300.0000 mg | ORAL_CAPSULE | Freq: Every day | ORAL | 1 refills | Status: AC
  Filled 2024-09-02 – 2024-10-01 (×3): qty 90, 90d supply, fill #0

## 2024-09-02 MED ORDER — DICLOFENAC SODIUM 75 MG PO TBEC
75.0000 mg | DELAYED_RELEASE_TABLET | Freq: Two times a day (BID) | ORAL | 1 refills | Status: AC
  Filled 2024-09-02: qty 180, 90d supply, fill #0

## 2024-09-02 MED ORDER — VENLAFAXINE HCL ER 150 MG PO CP24
150.0000 mg | ORAL_CAPSULE | Freq: Every day | ORAL | 2 refills | Status: AC
  Filled 2024-09-02 – 2024-10-17 (×2): qty 90, 90d supply, fill #0

## 2024-09-02 MED ORDER — OMEPRAZOLE 40 MG PO CPDR
40.0000 mg | DELAYED_RELEASE_CAPSULE | Freq: Every day | ORAL | 2 refills | Status: AC
  Filled 2024-09-02 – 2024-10-28 (×2): qty 90, 90d supply, fill #0

## 2024-09-02 NOTE — Progress Notes (Signed)
 Patient ID: Molly Park, female  DOB: 08-22-1961, 63 y.o.   MRN: [id number] Patient Care Team    Relationship Specialty Notifications Start End  Catherine Charlies LABOR, DO PCP - General Family Medicine  01/14/17   Jannis Kate Norris, MD Consulting Physician Obstetrics and Gynecology  01/14/18   Jefrey Bruckner, MD Referring Physician Gastroenterology  09/02/24    Comment: Jefrey Bruckner, MD  [address]  [place], [place] [address]  Phone: tel:[phone]  fax:[phone]    Chief Complaint  Patient presents with   Hypothyroidism   Hyperlipidemia    Pt is fasting.  Chronic condition management Influenza vaccine- 10/3 Prevnar vaccine- declined Abstract colonoscopy completed 05/25/2024-printed    Subjective: Molly Park is a 63 y.o.  Female  present for Chronic Conditions/illness Management All past medical history, surgical history, allergies, family history, immunizations, medications and social history were updated in the electronic medical record today. All recent labs, ED visits and hospitalizations within the last year were reviewed.  Hypothyroidism, unspecified type Thyroid  levels have been stable patient reports compliance with levothyroxine  100 mcg daily.  Labs are up-to-date   Hot flashes/focus She reports compliance with Effexor  150 mg daily.  She feels medication works well to help her focus and with her hot flashes, with added benefit to decreasing anxiety.   Mixed hyperlipidemia She is compliant with  atorvastatin .    Lumbar pain/arthritis She reports compliance with diclofenac  once daily with food and gabapentin  300 mg at bedtime..  She denies any worsening of reflux symptoms.  Medication regimen helps her with her pain and keeps her active.  GERD: Patient reports symptoms are well-controlled on omeprazole  40 mg daily.  Symptoms do return if she discontinues omeprazole .  Symptoms present prior to start of NSAIDs and have not worsened  since.  Health maintenance/reviewed EMR: Abstract colonoscopy completion from digestive health specialist Dr. Jue-05/25/2024, 10-year recall Requested Pap records from GYN       09/02/2024    8:07 AM 10/13/2023   11:09 AM 06/11/2023   10:08 AM 03/03/2023    1:27 PM 02/22/2022    8:08 AM  Depression screen PHQ 2/9  Decreased Interest 0 0 0 0 0  Down, Depressed, Hopeless 0 0 0 0 0  PHQ - 2 Score 0 0 0 0 0      10/13/2023   11:09 AM 06/11/2023   10:09 AM 01/20/2019    8:55 AM 10/27/2018    3:59 PM  GAD 7 : Generalized Anxiety Score  Nervous, Anxious, on Edge 0 0 0 1  Control/stop worrying 0 0 0 1  Worry too much - different things 0 0 0 1  Trouble relaxing 0 0 0 1  Restless 0 0 0 1  Easily annoyed or irritable 0 0 0 0  Afraid - awful might happen 0 0 0 0  Total GAD 7 Score 0 0 0 5  Anxiety Difficulty Not difficult at all Not difficult at all Not difficult at all Not difficult at all      01/17/2020   11:15 AM 03/03/2023    1:26 PM 06/11/2023   10:08 AM 10/13/2023   11:09 AM 09/02/2024    8:07 AM  Fall Risk  Falls in the past year?  0 0 0 0  Was there an injury with Fall?  0 0 0 0  Fall Risk Category Calculator  0 0 0 0  (RETIRED) Patient Fall Risk Level Low fall risk  Patient at Risk for Falls Due to   No Fall Risks No Fall Risks No Fall Risks  Fall risk Follow up  Falls evaluation completed Falls evaluation completed Falls evaluation completed Falls evaluation completed     Data saved with a previous flowsheet row definition     Immunization History  Administered Date(s) Administered   Fluzone Influenza virus vaccine,trivalent (IIV3), split virus 08/16/2008   Hepatitis A, Ped/Adol-2 Dose 12/07/2012   Influenza, Seasonal, Injecte, Preservative Fre 09/15/2014   Influenza,inj,Quad PF,6+ Mos 08/11/2022, 08/23/2023   Influenza-Unspecified 09/15/2014, 08/11/2016, 09/15/2018, 08/11/2020, 08/11/2021, 08/13/2024   Novel Infuenza-h1n1-09 09/09/2008   PFIZER(Purple  Top)SARS-COV-2 Vaccination 11/16/2019, 12/06/2019   Tdap 01/14/2017   Zoster Recombinant(Shingrix ) 01/20/2019, 02/22/2022    Past Medical History:  Diagnosis Date   Bilateral edema of lower extremity    tried on low dose lasix by prior provider   Chicken pox    Closed fracture of lower end of right radius with routine healing 08/18/2019   Dysphagia 07/22/2018   Hemorrhoid    History of COVID-19 02/15/2021   HSV infection    Hypothyroidism    Nonallopathic lesion of cervical region 08/24/2020   PONV (postoperative nausea and vomiting)    Rotator cuff impingement syndrome of right shoulder    Sprain of sacroiliac ligament 10/22/2021   No Known Allergies Past Surgical History:  Procedure Laterality Date   APPENDECTOMY  1973   BREAST EXCISIONAL BIOPSY Right 2014   EXAM UNDER ANESTHESIA WITH MANIPULATION OF SHOULDER Right 01/17/2020   Procedure: Right shoulder manipulation under anesthesia;  Surgeon: Dozier Soulier, MD;  Location: [facility];  Service: Orthopedics;  Laterality: Right;   HEMORRHOID SURGERY     OPEN REDUCTION INTERNAL FIXATION (ORIF) DISTAL RADIAL FRACTURE Right 08/12/2019   Procedure: OPEN REDUCTION INTERNAL FIXATION (ORIF) DISTAL RADIAL FRACTURE;  Surgeon: Murrell Drivers, MD;  Location: [facility];  Service: Orthopedics;  Laterality: Right;   SHOULDER INJECTION Right 01/17/2020   Procedure: SHOULDER STEROID INJECTION;  Surgeon: Dozier Soulier, MD;  Location: [facility];  Service: Orthopedics;  Laterality: Right;   TUBAL LIGATION  2004   Family History  Problem Relation Age of Onset   Diabetes Mother    Valvular heart disease Father    Breast cancer Maternal Aunt        59   Social History   Social History Narrative   Patient is married to [place]. They have 5 children btw them.   BA degree. 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   Drinks caffeine.   Wears a seatbelt, wears a bicycle  helmet, smoke detector in the home.   Firearms in the home.   Exercises routinely.   Feels safe in her relationships.    Allergies as of 09/02/2024   No Known Allergies      Medication List        Accurate as of September 02, 2024  8:13 AM. If you have any questions, ask your nurse or doctor.          atorvastatin  20 MG tablet Commonly known as: LIPITOR Take 1 tablet (20 mg total) by mouth daily.   diclofenac  75 MG EC tablet Commonly known as: VOLTAREN  Take 1 tablet (75 mg total) by mouth 2 (two) times daily.   gabapentin  300 MG capsule Commonly known as: NEURONTIN  Take 1 capsule (300 mg total) by mouth at bedtime.   levothyroxine  100 MCG tablet Commonly known as: Synthroid  Take 1  tablet (100 mcg total) by mouth daily.   meloxicam  15 MG tablet Commonly known as: MOBIC  Take 1 tablet by mouth daily for 2 weeks.  If still in pain after 2 weeks, take 1 tablet daily for an additional 1 week.   Na Sulfate-K Sulfate-Mg Sulfate concentrate 17.5-3.13-1.6 GM/177ML Soln Commonly known as: SUPREP Take 177 mLs by mouth 2 (two) times. Please refer to prep instructions sent by [facility]   omeprazole  40 MG capsule Commonly known as: PRILOSEC Take 1 capsule (40 mg total) by mouth daily.   venlafaxine  XR 150 MG 24 hr capsule Commonly known as: Effexor  XR Take 1 capsule (150 mg total) by mouth daily with breakfast.   Vitamin D  50 MCG (2000 UT) tablet Take 2,000 Units by mouth daily.        All past medical history, surgical history, allergies, family history, immunizations andmedications were updated in the EMR today and reviewed under the history and medication portions of their EMR.     Recent Results (from the past 2160 hours)  POCT HgB A1C     Status: Abnormal   Collection Time: 09/02/24  8:10 AM  Result Value Ref Range   Hemoglobin A1C 5.8 (A) 4.0 - 5.6 %   HbA1c POC (<> result, manual entry) 5.8 4.0 - 5.6 %   HbA1c, POC (prediabetic range) 5.8 5.7 - 6.4 %   HbA1c, POC  (controlled diabetic range) 5.8 0.0 - 7.0 %      Review of Systems  All other systems reviewed and are negative.  14 pt review of systems performed and negative (unless mentioned in an HPI) Objective: BP 124/78   Pulse 81   Temp 98.2 F (36.8 C)   Wt 164 lb (74.4 kg)   LMP  (LMP Unknown)   SpO2 97%   BMI 27.29 kg/m  Physical Exam Vitals and nursing note reviewed.  Constitutional:      General: She is not in acute distress.    Appearance: Normal appearance. She is not ill-appearing, toxic-appearing or diaphoretic.  HENT:     Head: Normocephalic and atraumatic.  Eyes:     General: No scleral icterus.       Right eye: No discharge.        Left eye: No discharge.     Extraocular Movements: Extraocular movements intact.     Conjunctiva/sclera: Conjunctivae normal.     Pupils: Pupils are equal, round, and reactive to light.  Cardiovascular:     Rate and Rhythm: Normal rate and regular rhythm.     Heart sounds: No murmur heard. Pulmonary:     Effort: Pulmonary effort is normal. No respiratory distress.     Breath sounds: Normal breath sounds. No wheezing, rhonchi or rales.  Musculoskeletal:     Right lower leg: No edema.     Left lower leg: No edema.  Skin:    General: Skin is warm.     Findings: No rash.  Neurological:     Mental Status: She is alert and oriented to person, place, and time. Mental status is at baseline.     Motor: No weakness.     Gait: Gait normal.  Psychiatric:        Mood and Affect: Mood normal.        Behavior: Behavior normal.        Thought Content: Thought content normal.        Judgment: Judgment normal.     Assessment/plan: Trenise Turay is a 63 y.o. female  present for chronic condition management Hypothyroidism due to acquired atrophy of thyroid  Stable Continue levothyroxine  100 mcg daily Labs UTD 02/2024  Mixed hyperlipidemia/On statin therapy/Overweight (BMI 25.0-29.9) Stable Continue atorvastatin   Elevated A1c: 6.2 > 5.7  >6.1>5.8 collected today  Vitamin D  deficiency - VITAMIN D  25 levels UTD Continue vitamin D  supplementation y  Hot flashes Stable Continue effexor  150 qd  Arthritis/lumbar pain Stable Continue diclofenac  QD (encouraged her to look into goodrx- may need to use short acting) Continue gabapentin  300 mg nightly-reviewed [place]  controlled substance database today.  Gastroesophageal reflux disease without esophagitis Stable Continue omeprazole  every day  Health maintenance/reviewed EMR: Abstract colonoscopy completion from digestive health specialist Dr. Jue-05/25/2024, 10-year recall Requested Pap records from GYN office (Dr. Jannis)   Influenza vaccine: updated Prevnar vaccine: declined  Return in about 7 months (around 03/21/2025) for cpe (20 min), Routine chronic condition follow-up.  Orders Placed This Encounter  Procedures   POCT HgB A1C   Meds ordered this encounter  Medications   diclofenac  (VOLTAREN ) 75 MG EC tablet    Sig: Take 1 tablet (75 mg total) by mouth 2 (two) times daily.    Dispense:  180 tablet    Refill:  1   gabapentin  (NEURONTIN ) 300 MG capsule    Sig: Take 1 capsule (300 mg total) by mouth at bedtime.    Dispense:  90 capsule    Refill:  1   omeprazole  (PRILOSEC) 40 MG capsule    Sig: Take 1 capsule (40 mg total) by mouth daily.    Dispense:  90 capsule    Refill:  2   venlafaxine  XR (EFFEXOR  XR) 150 MG 24 hr capsule    Sig: Take 1 capsule (150 mg total) by mouth daily with breakfast.    Dispense:  90 capsule    Refill:  2   Referral Orders  No referral(s) requested today       Electronically signed by: Charlies Bellini, DO [facility]

## 2024-09-02 NOTE — Patient Instructions (Signed)

## 2024-09-13 ENCOUNTER — Encounter: Payer: Self-pay | Admitting: Radiology

## 2024-09-27 ENCOUNTER — Other Ambulatory Visit (HOSPITAL_BASED_OUTPATIENT_CLINIC_OR_DEPARTMENT_OTHER): Payer: Self-pay

## 2024-09-30 ENCOUNTER — Other Ambulatory Visit (HOSPITAL_BASED_OUTPATIENT_CLINIC_OR_DEPARTMENT_OTHER): Payer: Self-pay

## 2024-10-01 ENCOUNTER — Other Ambulatory Visit: Payer: Self-pay

## 2024-10-01 ENCOUNTER — Other Ambulatory Visit (HOSPITAL_BASED_OUTPATIENT_CLINIC_OR_DEPARTMENT_OTHER): Payer: Self-pay

## 2024-10-18 ENCOUNTER — Other Ambulatory Visit (HOSPITAL_BASED_OUTPATIENT_CLINIC_OR_DEPARTMENT_OTHER): Payer: Self-pay

## 2024-10-18 ENCOUNTER — Other Ambulatory Visit: Payer: Self-pay

## 2024-10-29 ENCOUNTER — Other Ambulatory Visit (HOSPITAL_BASED_OUTPATIENT_CLINIC_OR_DEPARTMENT_OTHER): Payer: Self-pay

## 2024-10-29 ENCOUNTER — Other Ambulatory Visit: Payer: Self-pay
